# Patient Record
Sex: Female | Born: 1945
Health system: Southern US, Community
[De-identification: ages and names within clinical notes are randomized; demographics above are authoritative.]

## PROBLEM LIST (undated history)

## (undated) DIAGNOSIS — I10 Essential (primary) hypertension: Secondary | ICD-10-CM

## (undated) DIAGNOSIS — J302 Other seasonal allergic rhinitis: Secondary | ICD-10-CM

## (undated) DIAGNOSIS — K219 Gastro-esophageal reflux disease without esophagitis: Secondary | ICD-10-CM

## (undated) DIAGNOSIS — T7840XA Allergy, unspecified, initial encounter: Secondary | ICD-10-CM

## (undated) DIAGNOSIS — R001 Bradycardia, unspecified: Secondary | ICD-10-CM

## (undated) DIAGNOSIS — E785 Hyperlipidemia, unspecified: Secondary | ICD-10-CM

## (undated) DIAGNOSIS — M1990 Unspecified osteoarthritis, unspecified site: Secondary | ICD-10-CM

## (undated) HISTORY — PX: WISDOM TOOTH EXTRACTION: SHX21

## (undated) HISTORY — PX: POLYPECTOMY: SHX149

## (undated) HISTORY — DX: Hyperlipidemia, unspecified: E78.5

## (undated) HISTORY — DX: Gastro-esophageal reflux disease without esophagitis: K21.9

## (undated) HISTORY — DX: Other seasonal allergic rhinitis: J30.2

## (undated) HISTORY — DX: Allergy, unspecified, initial encounter: T78.40XA

## (undated) HISTORY — PX: TONSILLECTOMY: SUR1361

## (undated) HISTORY — DX: Bradycardia, unspecified: R00.1

## (undated) HISTORY — DX: Essential (primary) hypertension: I10

## (undated) HISTORY — DX: Unspecified osteoarthritis, unspecified site: M19.90

---

## 1983-07-29 HISTORY — PX: FOOT SURGERY: SHX648

## 1993-07-28 HISTORY — PX: ABDOMINAL HYSTERECTOMY: SHX81

## 1998-09-10 ENCOUNTER — Encounter: Payer: Self-pay | Admitting: General Surgery

## 1998-09-10 ENCOUNTER — Ambulatory Visit (HOSPITAL_BASED_OUTPATIENT_CLINIC_OR_DEPARTMENT_OTHER): Admission: RE | Admit: 1998-09-10 | Discharge: 1998-09-10 | Payer: Self-pay | Admitting: General Surgery

## 1999-09-25 ENCOUNTER — Encounter: Admission: RE | Admit: 1999-09-25 | Discharge: 1999-09-25 | Payer: Self-pay | Admitting: Family Medicine

## 1999-09-25 ENCOUNTER — Encounter: Payer: Self-pay | Admitting: Family Medicine

## 1999-10-02 ENCOUNTER — Encounter: Admission: RE | Admit: 1999-10-02 | Discharge: 1999-10-15 | Payer: Self-pay | Admitting: Family Medicine

## 2009-09-22 ENCOUNTER — Emergency Department (HOSPITAL_COMMUNITY): Admission: EM | Admit: 2009-09-22 | Discharge: 2009-09-22 | Payer: Self-pay | Admitting: Family Medicine

## 2009-09-22 ENCOUNTER — Emergency Department (HOSPITAL_COMMUNITY): Admission: EM | Admit: 2009-09-22 | Discharge: 2009-09-23 | Payer: Self-pay | Admitting: Emergency Medicine

## 2010-08-12 ENCOUNTER — Ambulatory Visit (HOSPITAL_COMMUNITY)
Admission: RE | Admit: 2010-08-12 | Discharge: 2010-08-12 | Payer: Self-pay | Source: Home / Self Care | Attending: Orthopedic Surgery | Admitting: Orthopedic Surgery

## 2011-07-29 HISTORY — PX: COLONOSCOPY: SHX174

## 2012-03-22 ENCOUNTER — Encounter: Payer: Self-pay | Admitting: Internal Medicine

## 2012-03-22 ENCOUNTER — Ambulatory Visit (AMBULATORY_SURGERY_CENTER): Payer: 59 | Admitting: *Deleted

## 2012-03-22 VITALS — Ht 64.5 in | Wt 141.7 lb

## 2012-03-22 DIAGNOSIS — Z1211 Encounter for screening for malignant neoplasm of colon: Secondary | ICD-10-CM

## 2012-03-22 MED ORDER — MOVIPREP 100 G PO SOLR: 1.0000 | Freq: Once | ORAL | Status: DC

## 2012-04-05 ENCOUNTER — Encounter: Payer: Self-pay | Admitting: Gastroenterology

## 2012-04-09 ENCOUNTER — Ambulatory Visit (AMBULATORY_SURGERY_CENTER): Payer: 59 | Admitting: Internal Medicine

## 2012-04-09 ENCOUNTER — Encounter: Payer: Self-pay | Admitting: Internal Medicine

## 2012-04-09 VITALS — BP 140/79 | HR 68 | Temp 97.3°F | Resp 25 | Ht 64.0 in | Wt 141.0 lb

## 2012-04-09 DIAGNOSIS — D126 Benign neoplasm of colon, unspecified: Secondary | ICD-10-CM

## 2012-04-09 DIAGNOSIS — Z1211 Encounter for screening for malignant neoplasm of colon: Secondary | ICD-10-CM

## 2012-04-09 MED ORDER — SODIUM CHLORIDE 0.9 % IV SOLN: 500.0000 mL | INTRAVENOUS | Status: DC

## 2012-04-09 NOTE — Patient Instructions (Addendum)

## 2012-04-09 NOTE — Op Note (Signed)
[facility] [address] [place], [address]   COLONOSCOPY PROCEDURE REPORT  PATIENT: Joss, Friedel  MR#: [id number] BIRTHDATE: June 10, 1946 , 66  yrs. old GENDER: Female ENDOSCOPIST: Hart Carwin, MD REFERRED BY:  Merri Brunette, M.D. PROCEDURE DATE:  04/09/2012 PROCEDURE:   Colonoscopy with snare polypectomy and Colonoscopy with cold biopsy polypectomy ASA CLASS:   Class II INDICATIONS:average risk screening. MEDICATIONS: MAC sedation, administered by CRNA and Propofol (Diprivan)250 mg IV  DESCRIPTION OF PROCEDURE:   After the risks and benefits and of the procedure were explained, informed consent was obtained.  A digital rectal exam revealed no abnormalities of the rectum.    The LB CF-[id number]  endoscope was introduced through the anus and advanced to the cecum, which was identified by both the appendix and ileocecal valve .  The quality of the prep was good, using MoviPrep .  The instrument was then slowly withdrawn as the colon was fully examined.     COLON FINDINGS: Two sessile polyps ranging between 3-54mm in size were found in the descending colon.  A polypectomy was performed with cold forceps and with a cold snare.  4mm and 6 mm polyps at 60 cm and 80 cm, removed with biopsies and cold snare The resection was complete and the polyp tissue was completely retrieved. Retroflexed views revealed no abnormalities.     The scope was then withdrawn from the patient and the procedure completed.  COMPLICATIONS: There were no complications. ENDOSCOPIC IMPRESSION: Two sessile polyps ranging between 3-8mm in size were found in the descending colonat 60 and 80 cm, polypectomy was performed with cold forceps and with a cold snare  RECOMMENDATIONS: 1.  await pathology results 2.  High fiber diet   REPEAT EXAM: In 5 year(s)  for Colonoscopy.  cc:  _______________________________ eSignedHart Carwin, MD 04/09/2012 10:21 AM     PATIENT  NAME:  Lucindia, Lemley MR#: [id number]

## 2012-04-09 NOTE — Progress Notes (Signed)
Patient did not experience any of the following events: a burn prior to discharge; a fall within the facility; wrong site/side/patient/procedure/implant event; or a hospital transfer or hospital admission upon discharge from the facility. (G8907) Patient did not have preoperative order for IV antibiotic SSI prophylaxis. (G8918)  

## 2012-04-12 ENCOUNTER — Telehealth: Payer: Self-pay

## 2012-04-12 NOTE — Telephone Encounter (Signed)
  Follow up Call-  Call back number 04/09/2012  Post procedure Call Back phone  # [phone]  Permission to leave phone message Yes     Patient questions:  Do you have a fever, pain , or abdominal swelling? no Pain Score  0 *  Have you tolerated food without any problems? yes  Have you been able to return to your normal activities? yes  Do you have any questions about your discharge instructions: Diet   no Medications  no Follow up visit  no  Do you have questions or concerns about your Care? no  Actions: * If pain score is 4 or above: No action needed, pain <4.

## 2012-04-13 ENCOUNTER — Encounter: Payer: Self-pay | Admitting: Internal Medicine

## 2012-10-22 ENCOUNTER — Ambulatory Visit (INDEPENDENT_AMBULATORY_CARE_PROVIDER_SITE_OTHER): Payer: Self-pay | Admitting: Family Medicine

## 2012-10-22 ENCOUNTER — Telehealth: Payer: Self-pay | Admitting: Radiology

## 2012-10-22 ENCOUNTER — Ambulatory Visit: Payer: Self-pay

## 2012-10-22 VITALS — BP 144/80 | HR 91 | Temp 98.7°F | Resp 18 | Ht 63.0 in | Wt 140.8 lb

## 2012-10-22 DIAGNOSIS — E7800 Pure hypercholesterolemia, unspecified: Secondary | ICD-10-CM | POA: Insufficient documentation

## 2012-10-22 DIAGNOSIS — S52121A Displaced fracture of head of right radius, initial encounter for closed fracture: Secondary | ICD-10-CM

## 2012-10-22 DIAGNOSIS — M25529 Pain in unspecified elbow: Secondary | ICD-10-CM

## 2012-10-22 DIAGNOSIS — I10 Essential (primary) hypertension: Secondary | ICD-10-CM | POA: Insufficient documentation

## 2012-10-22 DIAGNOSIS — M25521 Pain in right elbow: Secondary | ICD-10-CM

## 2012-10-22 DIAGNOSIS — S52123A Displaced fracture of head of unspecified radius, initial encounter for closed fracture: Secondary | ICD-10-CM

## 2012-10-22 MED ORDER — HYDROCODONE-ACETAMINOPHEN 5-325 MG PO TABS: 1.0000 | ORAL_TABLET | Freq: Four times a day (QID) | ORAL | Status: DC | PRN

## 2012-10-22 NOTE — Progress Notes (Signed)
   [address], [place] [address]   Phone [phone]  Subjective:    Patient ID: Denise Hodges, female    DOB: 05/21/1946, 67 y.o.   MRN: [id number]  HPI Pt presents to clinic with R elbow pain.  She fell in Feb and her R elbow hurt for a day or so but then the pain stopped and she was back to doing anything she wanted.  On sat she hit her elbow on her headboard but she had forgotten about this until questioned.  Then on Tuesday her elbow started to hurt really bad and the pain has worsened since then.  She has noticed swelling around her elbow and now has pain in her wrist and shoulder.  She has tried tylenol and ice and neither are helping with her pain.  She was up most of the night because the pain was so bad.     Review of Systems  Musculoskeletal: Positive for joint swelling.       Objective:   Physical Exam  Vitals reviewed. Constitutional: She is oriented to person, place, and time. She appears well-developed and well-nourished.  HENT:  Head: Normocephalic and atraumatic.  Right Ear: External ear normal.  Left Ear: External ear normal.  Pulmonary/Chest: Effort normal.  Musculoskeletal:       Right elbow: She exhibits swelling. Tenderness found. Radial head (significant tenderness with palpation) and lateral epicondyle (mild) tenderness noted. No olecranon process tenderness noted.  Any movement of arm including wrist and shoulder. cause pain pain in her elbow.  She has significant swelling above and below posterior elbow no swelling over olecranon process/bursa.  There is no erythema.  Skin does not hurt.  Pronation and supination of arm cause pain.  Good pulses and distal sensation.  Pt has decreased grip strength due to elbow pain.  Neurological: She is alert and oriented to person, place, and time.  Skin: Skin is warm and dry.  Psychiatric: She has a normal mood and affect. Her behavior is normal. Judgment and thought content normal.     [facility] reading (PRIMARY) by   Dr. Katrinka Blazing.  ? Impacted radial head fx. ? Irregularity of ulnar.       Assessment & Plan:  Right elbow pain/radial head fracture - Plan: DG Elbow Complete Right, Ambulatory referral to Orthopedic Surgery, HYDROcodone-acetaminophen (NORCO/VICODIN) 5-325 MG per tablet -  Pt places in long arm splint and sling and will see ortho next week.  After splint ins placed pt has improvement in pain.  Pt's diagnosis and treatment plan was discussed with Dr Katrinka Blazing.

## 2012-10-22 NOTE — Telephone Encounter (Signed)
Call report from radiology patient has radial head fracture. To you.

## 2012-10-22 NOTE — Progress Notes (Signed)
History and physical exams discussed in detail with Benny Lennert, PA-C.  Xray reviewed +proximal radial head fracture.  Agree with assessment and plan; placed in long arm splint and will refer to ortho for further management.

## 2012-12-08 ENCOUNTER — Other Ambulatory Visit: Payer: Self-pay | Admitting: Orthopedic Surgery

## 2012-12-14 ENCOUNTER — Encounter (HOSPITAL_COMMUNITY): Payer: Self-pay | Admitting: Pharmacy Technician

## 2012-12-22 ENCOUNTER — Encounter (HOSPITAL_COMMUNITY)
Admission: RE | Admit: 2012-12-22 | Discharge: 2012-12-22 | Disposition: A | Discharge status: Home / Self Care | Payer: Medicare HMO | Source: Ambulatory Visit | Attending: Orthopedic Surgery | Admitting: Orthopedic Surgery

## 2012-12-22 ENCOUNTER — Ambulatory Visit (HOSPITAL_COMMUNITY)
Admission: RE | Admit: 2012-12-22 | Discharge: 2012-12-22 | Disposition: A | Discharge status: Home / Self Care | Payer: Medicare HMO | Source: Ambulatory Visit | Attending: Orthopedic Surgery | Admitting: Orthopedic Surgery

## 2012-12-22 ENCOUNTER — Encounter (HOSPITAL_COMMUNITY): Payer: Self-pay

## 2012-12-22 DIAGNOSIS — Z01818 Encounter for other preprocedural examination: Secondary | ICD-10-CM | POA: Insufficient documentation

## 2012-12-22 DIAGNOSIS — Z01812 Encounter for preprocedural laboratory examination: Secondary | ICD-10-CM | POA: Insufficient documentation

## 2012-12-22 DIAGNOSIS — M19019 Primary osteoarthritis, unspecified shoulder: Secondary | ICD-10-CM | POA: Insufficient documentation

## 2012-12-22 LAB — URINALYSIS, ROUTINE W REFLEX MICROSCOPIC
Ketones, ur: NEGATIVE mg/dL
Leukocytes, UA: NEGATIVE
Nitrite: NEGATIVE
Specific Gravity, Urine: 1.017 (ref 1.005–1.030)
pH: 6.5 (ref 5.0–8.0)

## 2012-12-22 LAB — CBC WITH DIFFERENTIAL/PLATELET
Basophils Absolute: 0 10*3/uL (ref 0.0–0.1)
Basophils Relative: 0 % (ref 0–1)
Eosinophils Absolute: 0.3 10*3/uL (ref 0.0–0.7)
Eosinophils Relative: 4 % (ref 0–5)
HCT: 35.8 % — ABNORMAL LOW (ref 36.0–46.0)
MCHC: 32.7 g/dL (ref 30.0–36.0)
MCV: 84.2 fL (ref 78.0–100.0)
Monocytes Absolute: 0.5 10*3/uL (ref 0.1–1.0)
Platelets: 329 10*3/uL (ref 150–400)
RDW: 13.1 % (ref 11.5–15.5)
WBC: 7.6 10*3/uL (ref 4.0–10.5)

## 2012-12-22 LAB — COMPREHENSIVE METABOLIC PANEL
ALT: 16 U/L (ref 0–35)
AST: 20 U/L (ref 0–37)
Albumin: 3.7 g/dL (ref 3.5–5.2)
Calcium: 9.6 mg/dL (ref 8.4–10.5)
Creatinine, Ser: 0.67 mg/dL (ref 0.50–1.10)
GFR calc non Af Amer: 88 mL/min — ABNORMAL LOW (ref >90)
Sodium: 137 mEq/L (ref 135–145)
Total Protein: 7.3 g/dL (ref 6.0–8.3)

## 2012-12-22 LAB — PROTIME-INR: INR: 0.94 (ref 0.00–1.49)

## 2012-12-22 NOTE — Progress Notes (Signed)
EKG 12/22/12 on chart, LOV note Dr. Katrinka Blazing 12/22/12 on chart

## 2012-12-22 NOTE — Patient Instructions (Addendum)
20 Martine L Mungin  12/22/2012   Your procedure is scheduled on: 12/30/12  Report to [facility] at 0730 AM.  Call this number if you have problems the morning of surgery [phone]: [phone]   Remember:   Do not eat food or drink liquids After Midnight.     Take these medicines the morning of surgery with A SIP OF WATER: zyrtec, hydrocodone if needed   Do not wear jewelry, make-up or nail polish.  Do not wear lotions, powders, or perfumes. You may wear deodorant.  Do not shave 48 hours prior to surgery. Men may shave face and neck.  Do not bring valuables to the hospital.  Contacts, dentures or bridgework may not be worn into surgery.  Leave suitcase in the car. After surgery it may be brought to your room.  For patients admitted to the hospital, checkout time is 11:00 AM the day of discharge.    Please read over the following fact sheets that you were given: MRSA Information, blood fact sheet, incentive spirometry fact sheet Birdie Sons, RN  pre op nurse call if needed [phone]    FAILURE TO FOLLOW THESE INSTRUCTIONS MAY RESULT IN CANCELLATION OF YOUR SURGERY   Patient Signature: ___________________________________________

## 2012-12-30 ENCOUNTER — Encounter (HOSPITAL_COMMUNITY): Payer: Self-pay | Admitting: Anesthesiology

## 2012-12-30 ENCOUNTER — Encounter (HOSPITAL_COMMUNITY)
Admission: RE | Disposition: A | Discharge status: Home / Self Care | Payer: Self-pay | Source: Ambulatory Visit | Attending: Orthopedic Surgery

## 2012-12-30 ENCOUNTER — Inpatient Hospital Stay (HOSPITAL_COMMUNITY)
Admission: RE | Admit: 2012-12-30 | Discharge: 2013-01-01 | DRG: 484 | Disposition: A | Discharge status: Home / Self Care | Payer: Medicare HMO | Source: Ambulatory Visit | Attending: Orthopedic Surgery | Admitting: Orthopedic Surgery

## 2012-12-30 ENCOUNTER — Ambulatory Visit (HOSPITAL_COMMUNITY): Payer: Medicare HMO | Admitting: Anesthesiology

## 2012-12-30 ENCOUNTER — Inpatient Hospital Stay (HOSPITAL_COMMUNITY): Payer: Medicare HMO

## 2012-12-30 DIAGNOSIS — K219 Gastro-esophageal reflux disease without esophagitis: Secondary | ICD-10-CM | POA: Diagnosis present

## 2012-12-30 DIAGNOSIS — E785 Hyperlipidemia, unspecified: Secondary | ICD-10-CM | POA: Diagnosis present

## 2012-12-30 DIAGNOSIS — M19019 Primary osteoarthritis, unspecified shoulder: Principal | ICD-10-CM | POA: Diagnosis present

## 2012-12-30 DIAGNOSIS — F17200 Nicotine dependence, unspecified, uncomplicated: Secondary | ICD-10-CM | POA: Diagnosis present

## 2012-12-30 DIAGNOSIS — I10 Essential (primary) hypertension: Secondary | ICD-10-CM | POA: Diagnosis present

## 2012-12-30 HISTORY — PX: TOTAL SHOULDER ARTHROPLASTY: SHX126

## 2012-12-30 LAB — TYPE AND SCREEN: ABO/RH(D): B POS

## 2012-12-30 SURGERY — ARTHROPLASTY, SHOULDER, TOTAL
Anesthesia: General | Site: Shoulder | Laterality: Right | Wound class: Clean

## 2012-12-30 MED ORDER — FLEET ENEMA 7-19 GM/118ML RE ENEM: 1.0000 | ENEMA | Freq: Once | RECTAL | Status: AC | PRN

## 2012-12-30 MED ORDER — HYDROMORPHONE HCL PF 1 MG/ML IJ SOLN: 0.2500 mg | INTRAMUSCULAR | Status: DC | PRN

## 2012-12-30 MED ORDER — POLYETHYLENE GLYCOL 3350 17 G PO PACK: 17.0000 g | PACK | Freq: Every day | ORAL | Status: DC | PRN

## 2012-12-30 MED ORDER — CEFAZOLIN SODIUM-DEXTROSE 2-3 GM-% IV SOLR
2.0000 g | INTRAVENOUS | Status: AC
  Administered 2012-12-30: 2 g via INTRAVENOUS

## 2012-12-30 MED ORDER — OXYCODONE-ACETAMINOPHEN 5-325 MG PO TABS: 1.0000 | ORAL_TABLET | ORAL | Status: DC | PRN

## 2012-12-30 MED ORDER — PHENOL 1.4 % MT LIQD: 1.0000 | OROMUCOSAL | Status: DC | PRN

## 2012-12-30 MED ORDER — OXYCODONE HCL 5 MG PO TABS
5.0000 mg | ORAL_TABLET | ORAL | Status: DC | PRN
  Administered 2012-12-30: 5 mg via ORAL
  Administered 2012-12-30 – 2013-01-01 (×7): 10 mg via ORAL
  Filled 2012-12-30 (×7): qty 2
  Filled 2012-12-30: qty 1

## 2012-12-30 MED ORDER — ACETAMINOPHEN 650 MG RE SUPP: 650.0000 mg | Freq: Four times a day (QID) | RECTAL | Status: DC | PRN

## 2012-12-30 MED ORDER — DIPHENHYDRAMINE HCL 12.5 MG/5ML PO ELIX: 12.5000 mg | ORAL_SOLUTION | ORAL | Status: DC | PRN

## 2012-12-30 MED ORDER — CEFAZOLIN SODIUM 1-5 GM-% IV SOLN
1.0000 g | Freq: Four times a day (QID) | INTRAVENOUS | Status: AC
  Administered 2012-12-30 – 2012-12-31 (×3): 1 g via INTRAVENOUS
  Filled 2012-12-30 (×3): qty 50

## 2012-12-30 MED ORDER — ONDANSETRON HCL 4 MG/2ML IJ SOLN
INTRAMUSCULAR | Status: DC | PRN
  Administered 2012-12-30: 4 mg via INTRAVENOUS

## 2012-12-30 MED ORDER — SODIUM CHLORIDE 0.9 % IR SOLN
Status: DC | PRN
  Administered 2012-12-30: 1000 mL

## 2012-12-30 MED ORDER — ACETAMINOPHEN 10 MG/ML IV SOLN
INTRAVENOUS | Status: AC
  Filled 2012-12-30: qty 100

## 2012-12-30 MED ORDER — LACTATED RINGERS IV SOLN: INTRAVENOUS | Status: DC

## 2012-12-30 MED ORDER — DOCUSATE SODIUM 100 MG PO CAPS
100.0000 mg | ORAL_CAPSULE | Freq: Two times a day (BID) | ORAL | Status: DC
  Administered 2012-12-31 – 2013-01-01 (×3): 100 mg via ORAL

## 2012-12-30 MED ORDER — ONDANSETRON HCL 4 MG PO TABS: 4.0000 mg | ORAL_TABLET | Freq: Four times a day (QID) | ORAL | Status: DC | PRN

## 2012-12-30 MED ORDER — LORATADINE 10 MG PO TABS
10.0000 mg | ORAL_TABLET | Freq: Every day | ORAL | Status: DC
  Filled 2012-12-30 (×3): qty 1

## 2012-12-30 MED ORDER — BISACODYL 5 MG PO TBEC: 5.0000 mg | DELAYED_RELEASE_TABLET | Freq: Every day | ORAL | Status: DC | PRN

## 2012-12-30 MED ORDER — ROCURONIUM BROMIDE 100 MG/10ML IV SOLN
INTRAVENOUS | Status: DC | PRN
  Administered 2012-12-30: 50 mg via INTRAVENOUS
  Administered 2012-12-30: 10 mg via INTRAVENOUS

## 2012-12-30 MED ORDER — GLYCOPYRROLATE 0.2 MG/ML IJ SOLN
INTRAMUSCULAR | Status: DC | PRN
  Administered 2012-12-30: 0.4 mg via INTRAVENOUS

## 2012-12-30 MED ORDER — PANTOPRAZOLE SODIUM 20 MG PO TBEC
20.0000 mg | DELAYED_RELEASE_TABLET | Freq: Every day | ORAL | Status: DC
  Administered 2012-12-30 – 2013-01-01 (×3): 20 mg via ORAL
  Filled 2012-12-30 (×3): qty 1

## 2012-12-30 MED ORDER — ALUMINUM HYDROXIDE GEL 320 MG/5ML PO SUSP: 15.0000 mL | ORAL | Status: DC | PRN

## 2012-12-30 MED ORDER — LIDOCAINE HCL (CARDIAC) 20 MG/ML IV SOLN
INTRAVENOUS | Status: DC | PRN
  Administered 2012-12-30: 50 mg via INTRAVENOUS

## 2012-12-30 MED ORDER — NEOSTIGMINE METHYLSULFATE 1 MG/ML IJ SOLN
INTRAMUSCULAR | Status: DC | PRN
  Administered 2012-12-30: 3 mg via INTRAVENOUS

## 2012-12-30 MED ORDER — MORPHINE SULFATE 2 MG/ML IJ SOLN
1.0000 mg | INTRAMUSCULAR | Status: DC | PRN
  Administered 2012-12-30 – 2012-12-31 (×2): 1 mg via INTRAVENOUS
  Filled 2012-12-30 (×2): qty 1

## 2012-12-30 MED ORDER — PHENYLEPHRINE HCL 10 MG/ML IJ SOLN
INTRAMUSCULAR | Status: DC | PRN
  Administered 2012-12-30 (×3): 40 ug via INTRAVENOUS

## 2012-12-30 MED ORDER — ACETAMINOPHEN 10 MG/ML IV SOLN
INTRAVENOUS | Status: DC | PRN
  Administered 2012-12-30: 1000 mg via INTRAVENOUS

## 2012-12-30 MED ORDER — ROPIVACAINE HCL 5 MG/ML IJ SOLN
INTRAMUSCULAR | Status: DC | PRN
  Administered 2012-12-30: 30 mL

## 2012-12-30 MED ORDER — ONDANSETRON HCL 4 MG/2ML IJ SOLN: 4.0000 mg | Freq: Four times a day (QID) | INTRAMUSCULAR | Status: DC | PRN

## 2012-12-30 MED ORDER — POVIDONE-IODINE 7.5 % EX SOLN
Freq: Once | CUTANEOUS | Status: AC
  Administered 2012-12-30: 09:00:00 via TOPICAL

## 2012-12-30 MED ORDER — 0.9 % SODIUM CHLORIDE (POUR BTL) OPTIME
TOPICAL | Status: DC | PRN
  Administered 2012-12-30: 1000 mL

## 2012-12-30 MED ORDER — SODIUM CHLORIDE 0.9 % IV SOLN
INTRAVENOUS | Status: DC
  Administered 2012-12-30: 17:00:00 via INTRAVENOUS

## 2012-12-30 MED ORDER — IRBESARTAN 150 MG PO TABS
150.0000 mg | ORAL_TABLET | Freq: Every day | ORAL | Status: DC
  Administered 2012-12-30 – 2013-01-01 (×3): 150 mg via ORAL
  Filled 2012-12-30 (×3): qty 1

## 2012-12-30 MED ORDER — ROPIVACAINE HCL 5 MG/ML IJ SOLN
INTRAMUSCULAR | Status: AC
  Filled 2012-12-30: qty 30

## 2012-12-30 MED ORDER — PHENYLEPHRINE HCL 10 MG/ML IJ SOLN
10.0000 mg | INTRAVENOUS | Status: DC | PRN
  Administered 2012-12-30: 10 ug/min via INTRAVENOUS

## 2012-12-30 MED ORDER — CEFAZOLIN SODIUM-DEXTROSE 2-3 GM-% IV SOLR
INTRAVENOUS | Status: AC
  Filled 2012-12-30: qty 50

## 2012-12-30 MED ORDER — METOCLOPRAMIDE HCL 10 MG PO TABS: 5.0000 mg | ORAL_TABLET | Freq: Three times a day (TID) | ORAL | Status: DC | PRN

## 2012-12-30 MED ORDER — BUPIVACAINE-EPINEPHRINE PF 0.25-1:200000 % IJ SOLN
INTRAMUSCULAR | Status: AC
  Filled 2012-12-30: qty 30

## 2012-12-30 MED ORDER — FENTANYL CITRATE 0.05 MG/ML IJ SOLN
INTRAMUSCULAR | Status: AC
  Filled 2012-12-30: qty 2

## 2012-12-30 MED ORDER — FENTANYL CITRATE 0.05 MG/ML IJ SOLN
50.0000 ug | Freq: Once | INTRAMUSCULAR | Status: AC
  Administered 2012-12-30: 100 ug via INTRAVENOUS

## 2012-12-30 MED ORDER — FENOFIBRATE 160 MG PO TABS
160.0000 mg | ORAL_TABLET | Freq: Every day | ORAL | Status: DC
  Administered 2012-12-30 – 2013-01-01 (×3): 160 mg via ORAL
  Filled 2012-12-30 (×3): qty 1

## 2012-12-30 MED ORDER — ACETAMINOPHEN 325 MG PO TABS: 650.0000 mg | ORAL_TABLET | Freq: Four times a day (QID) | ORAL | Status: DC | PRN

## 2012-12-30 MED ORDER — ASPIRIN EC 325 MG PO TBEC
325.0000 mg | DELAYED_RELEASE_TABLET | Freq: Two times a day (BID) | ORAL | Status: DC
  Administered 2012-12-30 – 2013-01-01 (×4): 325 mg via ORAL
  Filled 2012-12-30 (×6): qty 1

## 2012-12-30 MED ORDER — LACTATED RINGERS IV SOLN
INTRAVENOUS | Status: DC | PRN
  Administered 2012-12-30 (×2): via INTRAVENOUS

## 2012-12-30 MED ORDER — PROPOFOL 10 MG/ML IV BOLUS
INTRAVENOUS | Status: DC | PRN
  Administered 2012-12-30: 150 mg via INTRAVENOUS

## 2012-12-30 MED ORDER — MENTHOL 3 MG MT LOZG
1.0000 | LOZENGE | OROMUCOSAL | Status: DC | PRN
  Filled 2012-12-30: qty 9

## 2012-12-30 MED ORDER — MIDAZOLAM HCL 2 MG/2ML IJ SOLN
INTRAMUSCULAR | Status: AC
  Filled 2012-12-30: qty 4

## 2012-12-30 MED ORDER — MIDAZOLAM HCL 2 MG/2ML IJ SOLN
1.0000 mg | INTRAMUSCULAR | Status: DC | PRN
  Administered 2012-12-30: 2 mg via INTRAVENOUS

## 2012-12-30 MED ORDER — HYDROCODONE-ACETAMINOPHEN 5-325 MG PO TABS: 1.0000 | ORAL_TABLET | ORAL | Status: DC | PRN

## 2012-12-30 MED ORDER — METOCLOPRAMIDE HCL 5 MG/ML IJ SOLN: 5.0000 mg | Freq: Three times a day (TID) | INTRAMUSCULAR | Status: DC | PRN

## 2012-12-30 MED ORDER — CYCLOSPORINE 0.05 % OP EMUL
1.0000 [drp] | Freq: Two times a day (BID) | OPHTHALMIC | Status: DC
  Administered 2012-12-30 – 2013-01-01 (×4): 1 [drp] via OPHTHALMIC
  Filled 2012-12-30 (×5): qty 1

## 2012-12-30 SURGICAL SUPPLY — 75 items
ASSEMBLY NECK TAPER FIXED 135 (Orthopedic Implant) ×2 IMPLANT
BAG ZIPLOCK 12X15 (MISCELLANEOUS) ×2 IMPLANT
BIT DRILL 2.4X128 (BIT) IMPLANT
BLADE SAW SAG 73X25 THK (BLADE) ×1
BLADE SAW SGTL 18X1.27X75 (BLADE) IMPLANT
BLADE SAW SGTL 73X25 THK (BLADE) ×1 IMPLANT
BLADE SURG 15 STRL LF DISP TIS (BLADE) ×1 IMPLANT
BLADE SURG 15 STRL SS (BLADE) ×1
CEMENT BONE DEPUY (Cement) ×2 IMPLANT
CHLORAPREP W/TINT 26ML (MISCELLANEOUS) ×2 IMPLANT
CLEANER TIP ELECTROSURG 2X2 (MISCELLANEOUS) ×2 IMPLANT
CLOTH BEACON ORANGE TIMEOUT ST (SAFETY) ×2 IMPLANT
CLSR STERI-STRIP ANTIMIC 1/2X4 (GAUZE/BANDAGES/DRESSINGS) ×2 IMPLANT
COVER SURGICAL LIGHT HANDLE (MISCELLANEOUS) IMPLANT
DRAPE INCISE IOBAN 66X45 STRL (DRAPES) ×2 IMPLANT
DRAPE ORTHO SPLIT 77X108 STRL (DRAPES) ×1
DRAPE POUCH INSTRU U-SHP 10X18 (DRAPES) ×2 IMPLANT
DRAPE SURG 17X11 SM STRL (DRAPES) ×2 IMPLANT
DRAPE SURG ORHT 6 SPLT 77X108 (DRAPES) ×1 IMPLANT
DRAPE TABLE BACK 44X90 PK DISP (DRAPES) ×2 IMPLANT
DRAPE U-SHAPE 47X51 STRL (DRAPES) ×4 IMPLANT
DRSG ADAPTIC 3X8 NADH LF (GAUZE/BANDAGES/DRESSINGS) ×2 IMPLANT
DRSG MEPILEX BORDER 4X4 (GAUZE/BANDAGES/DRESSINGS) ×2 IMPLANT
DRSG MEPILEX BORDER 4X8 (GAUZE/BANDAGES/DRESSINGS) ×2 IMPLANT
ELECT BLADE TIP CTD 4 INCH (ELECTRODE) ×2 IMPLANT
ELECT REM PT RETURN 9FT ADLT (ELECTROSURGICAL) ×2
ELECTRODE REM PT RTRN 9FT ADLT (ELECTROSURGICAL) ×1 IMPLANT
EVACUATOR 1/8 PVC DRAIN (DRAIN) ×2 IMPLANT
FACESHIELD LNG OPTICON STERILE (SAFETY) ×2 IMPLANT
GLENOID ANCHOR PEG CROSSLK 44 (Orthopedic Implant) ×2 IMPLANT
GLOVE BIOGEL PI IND STRL 7.0 (GLOVE) ×1 IMPLANT
GLOVE BIOGEL PI IND STRL 7.5 (GLOVE) ×1 IMPLANT
GLOVE BIOGEL PI IND STRL 8 (GLOVE) ×1 IMPLANT
GLOVE BIOGEL PI INDICATOR 7.0 (GLOVE) ×1
GLOVE BIOGEL PI INDICATOR 7.5 (GLOVE) ×1
GLOVE BIOGEL PI INDICATOR 8 (GLOVE) ×1
GLOVE ORTHO TXT STRL SZ7.5 (GLOVE) ×2 IMPLANT
GLOVE SURG ORTHO 7.0 STRL STRW (GLOVE) ×2 IMPLANT
GLOVE SURG SS PI 7.5 STRL IVOR (GLOVE) ×4 IMPLANT
GOWN BRE IMP PREV XXLGXLNG (GOWN DISPOSABLE) ×4 IMPLANT
GOWN STRL NON-REIN LRG LVL3 (GOWN DISPOSABLE) ×4 IMPLANT
GOWN STRL REIN XL XLG (GOWN DISPOSABLE) ×2 IMPLANT
HANDPIECE INTERPULSE COAX TIP (DISPOSABLE) ×1
HEAD HUM ECC 44X15MM SHOULDER (Head) ×2 IMPLANT
HEMOSTAT SURGICEL 4X8 (HEMOSTASIS) ×2 IMPLANT
HOOD PEEL AWAY FACE SHEILD DIS (HOOD) ×6 IMPLANT
KIT BASIN OR (CUSTOM PROCEDURE TRAY) ×2 IMPLANT
MANIFOLD NEPTUNE II (INSTRUMENTS) ×2 IMPLANT
NEEDLE MA TROC 1/2 (NEEDLE) ×2 IMPLANT
NOZZLE PRISM 8.5MM (MISCELLANEOUS) IMPLANT
NS IRRIG 1000ML POUR BTL (IV SOLUTION) ×2 IMPLANT
PACK SHOULDER CUSTOM OPM052 (CUSTOM PROCEDURE TRAY) ×2 IMPLANT
PIN METAGLENE 2.5 (PIN) ×2 IMPLANT
POSITIONER SURGICAL ARM (MISCELLANEOUS) ×2 IMPLANT
RETRIEVER SUT HEWSON (MISCELLANEOUS) ×2 IMPLANT
SET HNDPC FAN SPRY TIP SCT (DISPOSABLE) ×1 IMPLANT
SLING ARM IMMOBILIZER LRG (SOFTGOODS) ×2 IMPLANT
SLING ARM IMMOBILIZER MED (SOFTGOODS) ×2 IMPLANT
SMARTMIX MINI TOWER (MISCELLANEOUS) ×2
SPONGE LAP 18X18 X RAY DECT (DISPOSABLE) IMPLANT
STAPLER VISISTAT 35W (STAPLE) IMPLANT
STEM AP PC10 (Stem) ×2 IMPLANT
STRIP CLOSURE SKIN 1/2X4 (GAUZE/BANDAGES/DRESSINGS) ×2 IMPLANT
SUCTION FRAZIER 12FR DISP (SUCTIONS) ×2 IMPLANT
SUPPORT WRAP ARM LG (MISCELLANEOUS) ×2 IMPLANT
SUT ETHIBOND NAB CT1 #1 30IN (SUTURE) ×6 IMPLANT
SUT FIBERWIRE #2 38 T-5 BLUE (SUTURE) ×6
SUT MNCRL AB 4-0 PS2 18 (SUTURE) ×2 IMPLANT
SUT VIC AB 0 CT1 36 (SUTURE) ×4 IMPLANT
SUT VIC AB 2-0 CT1 27 (SUTURE) ×2
SUT VIC AB 2-0 CT1 TAPERPNT 27 (SUTURE) ×2 IMPLANT
SUTURE FIBERWR #2 38 T-5 BLUE (SUTURE) ×3 IMPLANT
TOWEL OR 17X26 10 PK STRL BLUE (TOWEL DISPOSABLE) ×4 IMPLANT
TOWER SMARTMIX MINI (MISCELLANEOUS) ×1 IMPLANT
WATER STERILE IRR 1500ML POUR (IV SOLUTION) ×2 IMPLANT

## 2012-12-30 NOTE — H&P (Signed)
Denise Hodges is an 67 y.o. female.   Chief Complaint: R shoulder pain HPI: R shoulder endstage arthritis, bone on bone on x ray. Pain interferes with sleep and quality of life.  Failed conservative treatment of NSAID, activity modification.  Declined injections.  Past Medical History  Diagnosis Date  . Seasonal allergies   . GERD (gastroesophageal reflux disease)   . Hyperlipidemia   . Hypertension   . Arthritis     Past Surgical History  Procedure Laterality Date  . Abdominal hysterectomy  1995  . Foot surgery  1985    bilateral for flat feet with pain  . Colonoscopy  2013    brodie  . Wisdom tooth extraction    . Tonsillectomy  as child    Family History  Problem Relation Age of Onset  . Colon cancer Maternal Aunt 70  . Colon cancer Cousin 50    maternal  . Rectal cancer Neg Hx   . Stomach cancer Neg Hx   . Dementia Mother    Social History:  reports that she has been smoking Cigarettes.  She has been smoking about 0.25 packs per day. She has never used smokeless tobacco. She reports that she does not drink alcohol or use illicit drugs.  Allergies:  Allergies  Allergen Reactions  . Macrobid (Nitrofurantoin Macrocrystal) Hives and Shortness Of Breath    Medications Prior to Admission  Medication Sig Dispense Refill  . aspirin 81 MG tablet Take 81 mg by mouth daily.      . Calcium Carbonate-Vitamin D (CALCIUM 600 + D PO) Take 1 tablet by mouth daily.      . cetirizine (ZYRTEC) 10 MG tablet Take 10 mg by mouth daily.      . cholecalciferol (VITAMIN D-400) 400 UNITS TABS Take 400 Units by mouth daily.      . cycloSPORINE (RESTASIS) 0.05 % ophthalmic emulsion Place 1 drop into both eyes 2 (two) times daily.      . Fenofibrate 150 MG CAPS Take 1 capsule by mouth daily.      Marland Kitchen HYDROcodone-acetaminophen (NORCO/VICODIN) 5-325 MG per tablet Take 1 tablet by mouth every 6 (six) hours as needed for pain.  30 tablet  0  . lansoprazole (PREVACID) 15 MG capsule Take 15 mg by  mouth as needed.      . naproxen sodium (ANAPROX) 220 MG tablet Take 220 mg by mouth 2 (two) times daily as needed (for pain).      . valsartan (DIOVAN) 160 MG tablet Take 160 mg by mouth at bedtime.       . vitamin C (ASCORBIC ACID) 500 MG tablet Take 500 mg by mouth daily.        No results found for this or any previous visit (from the past 48 hour(s)). No results found.  Review of Systems  All other systems reviewed and are negative.    Blood pressure 150/71, pulse 77, temperature 98 F (36.7 C), temperature source Oral, resp. rate 18, SpO2 100.00%. Physical Exam  Constitutional: She is oriented to person, place, and time. She appears well-developed and well-nourished.  HENT:  Head: Atraumatic.  Eyes: EOM are normal.  Cardiovascular: Intact distal pulses.   Respiratory: Effort normal.  Musculoskeletal:       Right shoulder: She exhibits decreased range of motion, tenderness and pain.  Neurological: She is alert and oriented to person, place, and time.  Skin: Skin is warm and dry.  Psychiatric: She has a normal mood and affect.  Assessment/Plan R shoulder endstage glenohumeral osteoarthritis. Plan R total shoulder replacement Risks / benefits of surgery discussed Consent on chart  NPO for OR Preop antibiotics   Selda Jalbert WILLIAM 12/30/2012, 10:38 AM

## 2012-12-30 NOTE — Anesthesia Procedure Notes (Signed)
Anesthesia Regional Block:  Interscalene brachial plexus block  Pre-Anesthetic Checklist: ,, timeout performed, Correct Patient, Correct Site, Correct Laterality, Correct Procedure, Correct Position, site marked, Risks and benefits discussed,  Surgical consent,  Pre-op evaluation,  At surgeon's request and post-op pain management  Laterality: Right  Prep: chloraprep       Needles:  Injection technique: Single-shot  Needle Type: Echogenic Stimulator Needle     Needle Length:cm 9 cm Needle Gauge: 20 and 20 G    Additional Needles:  Procedures: ultrasound guided (picture in chart) Interscalene brachial plexus block  Nerve Stimulator or Paresthesia:  Response: 0.5 mA,   Additional Responses:   Narrative:  Start time: 12/30/2012 10:30 AM End time: 12/30/2012 10:40 AM Injection made incrementally with aspirations every 5 mL.  Performed by: Personally  Anesthesiologist: Gaetano Hawthorne MD  Additional Notes: Patient tolerated the procedure well without complications  Interscalene brachial plexus block

## 2012-12-30 NOTE — Progress Notes (Signed)
Utilization review completed.  

## 2012-12-30 NOTE — Op Note (Signed)
Procedure(s): RIGHT TOTAL SHOULDER ARTHROPLASTY Procedure Note  Denise Hodges female 67 y.o. 12/30/2012  Procedure(s) and Anesthesia Type:    * RIGHT TOTAL SHOULDER ARTHROPLASTY - General  Surgeon(s) and Role:    * Mable Paris, MD - Primary   Indications:  67 y.o. female  With endstage right shoulder arthritis. Pain and dysfunction interfered with quality of life and nonoperative treatment with activity modification, NSAIDS failed.     Surgeon: Mable Paris   Assistants: Damita Lack PA-C [facility] was present and scrubbed throughout the procedure and was essential in positioning, retraction, exposure, and closure)  Anesthesia: General endotracheal anesthesia with preoperative interscalene block    Procedure Detail  RIGHT TOTAL SHOULDER ARTHROPLASTY  Findings: DePuy size 10 press-fit stem proximally porous coated with a 44 x 15 eccentric head and a 44 anchor peg glenoid. Reconstruction was anatomic. Bone quality was good. The lesser tuberosity was taken down with an osteotomy and repaired at the end.  Estimated Blood Loss:  200 mL         Drains: 1 medium hemovac  Blood Given: none          Specimens: none        Complications:  * No complications entered in OR log *         Disposition: PACU - hemodynamically stable.         Condition: stable    Procedure:   The patient was identified in the preoperative holding area where I personally marked the operative extremity after verifying with the patient and consent. She  was taken to the operating room where She was transferred to the   operative table.  The patient received an interscalene block in   the holding area by the attending anesthesiologist.  General anesthesia was induced   in the operating room without complication.  The patient did receive IV  Ancef prior to the commencement of the procedure.  The patient was   placed in the beach-chair position with the back raised about  30   degrees.  The nonoperative extremity and head and neck were carefully   positioned and padded protecting against neurovascular compromise.  The   left upper extremity was then prepped and draped in the standard sterile   fashion.    The appropriate operative time-out was performed with   Anesthesia, the perioperative staff, as well as myself and we all agreed   that the right side was the correct operative site.  An approximately   10 cm incision was made from the tip of the coracoid to the center point of the   humerus at the level of the axilla.  Dissection was carried down sharply   through subcutaneous tissues and cephalic vein was identified and taken   laterally with the deltoid.  The pectoralis major was taken medially.  The   upper 1 cm of the pectoralis major was released from its attachment on   the humerus.  The clavipectoral fascia was incised just lateral to the   conjoined tendon.  This incision was carried up to but not into the   coracoacromial ligament.  Digital palpation was used to prove   integrity of the axillary nerve which was protected throughout the   procedure.  Musculocutaneous nerve was not palpated in the operative   field.  Conjoined tendon was then retracted gently medially and the   deltoid laterally.  Anterior circumflex humeral vessels were clamped and   coagulated.  The soft  tissues overlying the biceps was incised and this   incision was carried across the transverse humeral ligament to the base   of the coracoid.  The biceps was tenodesed to the soft tissue just above   pectoralis major and the remaining portion of the biceps superiorly was   excised.  An osteotomy was performed at the lesser tuberosity.  Capsule was then   released all the way down to the 6 o'clock position of the humeral head.   The humeral head was then delivered with simultaneous adduction,   extension and external rotation.  All humeral osteophytes were removed   and the  anatomic neck of the humerus was marked and cut free hand at   approximately 30 degrees retroversion within about 3 mm of the cuff   reflection posteriorly.  The head size was estimated to be a 44 medium   offset.  At that point, the humeral head was retracted posteriorly with   a Fukuda retractor.   The remaining biceps anchor and the entire anterior-inferior   labrum was excised.  The posterior labrum was also excised but the   posterior capsule was not released.   the anterior capsule was released from 12:00 to 6:00 position.  The guidepin was placed bicortically with +0 elevated guide.  The reamer was used to ream to concentric bone with punctate bleeding.  This gave an excellent concentric surface.  The center hole was then drilled for an anchor peg glenoid followed by the three peripheral holes and none of the holes   exited the glenoid wall.  I then pulse irrigated these holes and dried   them with Surgicel.  The three peripheral holes were then   pressurized cemented and the anchor peg glenoid was placed and impacted   with an excellent fit.  The glenoid was a 44 component.  The proximal humerus was then again exposed taking care not to displace the glenoid.    The humerus was then sequentially reamed going from 6 to 10 by 2 mm incriments. The 10 mm reamer was found to have appropriate cortical contact.  A   box osteotome was then used and a 10-mm broach.  The broach handle was   removed and the trial head was placed.   Calcar reamer was used.The eccentric 44 x 15 head fit best.  With the trial implantation of the component, there was   approximately 50% posterior translation with immediate snap back to the   anatomic position.  With forward elevation, there was no tendency   towards posterior subluxation.   The trial was removed and the final implant was prepared on a back table.  The trial was removed and the final implant was prepared on a back table.   Small holes were drilled on both  sides of the lesser tuberosity osteotomy, through which 3 #2 FiberWires were passed. The implant was then placed through the loop of all 3 #2 FiberWires and impacted with an excellent press-fit. This achieved excellent anatomic reconstruction of the proximal humerus.  The joint was then copiously irrigated with pulse lavage.  The subscapularis and   lesser tuberosity osteotomy were then repaired using the 3 #2 fiber wires previously passed.   One #1 Ethibond was placed at the rotator interval just above   the lesser tuberosity. After repair of the lesser tuberosity, a medium   Hemovac was placed out anterolaterally and again copious irrigation was   used.   Skin was closed with 2-0  Vicryl sutures in the deep dermal layer and 4-0 Monocryl in a subcuticular  running fashion.  Sterile dressings were then applied including Steri- Strips, 4x4s, ABDs and tape.  The patient was placed in a sling and allowed to awaken from general anesthesia and taken to the recovery room in stable  condition.      POSTOPERATIVE PLAN:  Early passive range of motion will be allowed with the goal of 40 degrees external rotation and a 140 degrees forward elevation.  No internal rotation at this time.  No active motion of the arm until the lesser tuberosity heals.  The patient will likely be kept in the hospital for 1-2 days and then discharged home.

## 2012-12-30 NOTE — Preoperative (Signed)
Beta Blockers   Reason not to administer Beta Blockers:Not Applicable 

## 2012-12-30 NOTE — Transfer of Care (Signed)
Immediate Anesthesia Transfer of Care Note  Patient: Denise Hodges  Procedure(s) Performed: Procedure(s) with comments: RIGHT TOTAL SHOULDER ARTHROPLASTY (Right) - interscaline block  Patient Location: PACU  Anesthesia Type:General  Level of Consciousness: awake, alert  and oriented  Airway & Oxygen Therapy: Patient Spontanous Breathing and Patient connected to face mask oxygen  Post-op Assessment: Report given to PACU RN, Post -op Vital signs reviewed and stable and Patient moving all extremities X 4  Post vital signs: Reviewed and stable  Complications: No apparent anesthesia complications

## 2012-12-30 NOTE — Anesthesia Postprocedure Evaluation (Signed)
  Anesthesia Post-op Note  Patient: Denise Hodges  Procedure(s) Performed: Procedure(s) (LRB): RIGHT TOTAL SHOULDER ARTHROPLASTY (Right)  Patient Location: PACU  Anesthesia Type: GA combined with regional for post-op pain  Level of Consciousness: awake and alert   Airway and Oxygen Therapy: Patient Spontanous Breathing  Post-op Pain: mild  Post-op Assessment: Post-op Vital signs reviewed, Patient's Cardiovascular Status Stable, Respiratory Function Stable, Patent Airway and No signs of Nausea or vomiting  Last Vitals:  Filed Vitals:   12/30/12 1441  BP: 119/56  Pulse: 63  Temp: 36.4 C  Resp: 15    Post-op Vital Signs: stable   Complications: No apparent anesthesia complications

## 2012-12-30 NOTE — Anesthesia Preprocedure Evaluation (Addendum)
Anesthesia Evaluation  Patient identified by MRN, date of birth, ID band Patient awake    Reviewed: Allergy & Precautions, H&P , NPO status , Patient's Chart, lab work & pertinent test results  Airway Mallampati: II TM Distance: >3 FB Neck ROM: full    Dental no notable dental hx. (+) Edentulous Upper, Dental Advisory Given and Missing All front lower are missing:   Pulmonary neg pulmonary ROS,  breath sounds clear to auscultation  Pulmonary exam normal       Cardiovascular Exercise Tolerance: Good hypertension, Pt. on medications Rhythm:regular Rate:Normal     Neuro/Psych negative neurological ROS  negative psych ROS   GI/Hepatic negative GI ROS, Neg liver ROS, GERD-  Medicated and Controlled,  Endo/Other  negative endocrine ROS  Renal/GU negative Renal ROS  negative genitourinary   Musculoskeletal   Abdominal   Peds  Hematology negative hematology ROS (+)   Anesthesia Other Findings   Reproductive/Obstetrics negative OB ROS                          Anesthesia Physical Anesthesia Plan  ASA: II  Anesthesia Plan: General   Post-op Pain Management:    Induction: Intravenous  Airway Management Planned: Oral ETT  Additional Equipment:   Intra-op Plan:   Post-operative Plan: Extubation in OR  Informed Consent: I have reviewed the patients History and Physical, chart, labs and discussed the procedure including the risks, benefits and alternatives for the proposed anesthesia with the patient or authorized representative who has indicated his/her understanding and acceptance.   Dental Advisory Given  Plan Discussed with: CRNA and Surgeon  Anesthesia Plan Comments:         Anesthesia Quick Evaluation

## 2012-12-31 ENCOUNTER — Encounter (HOSPITAL_COMMUNITY): Payer: Self-pay | Admitting: Orthopedic Surgery

## 2012-12-31 DIAGNOSIS — M19019 Primary osteoarthritis, unspecified shoulder: Secondary | ICD-10-CM

## 2012-12-31 LAB — CBC
Platelets: 284 10*3/uL (ref 150–400)
RBC: 3.36 MIL/uL — ABNORMAL LOW (ref 3.87–5.11)
WBC: 8.8 10*3/uL (ref 4.0–10.5)

## 2012-12-31 LAB — COMPREHENSIVE METABOLIC PANEL
ALT: 16 U/L (ref 0–35)
AST: 19 U/L (ref 0–37)
Albumin: 3.1 g/dL — ABNORMAL LOW (ref 3.5–5.2)
CO2: 25 mEq/L (ref 19–32)
Chloride: 104 mEq/L (ref 96–112)
GFR calc non Af Amer: 85 mL/min — ABNORMAL LOW (ref >90)
Potassium: 4.1 mEq/L (ref 3.5–5.1)
Sodium: 137 mEq/L (ref 135–145)
Total Bilirubin: 0.2 mg/dL — ABNORMAL LOW (ref 0.3–1.2)

## 2012-12-31 MED ORDER — OXYCODONE-ACETAMINOPHEN 5-325 MG PO TABS: 1.0000 | ORAL_TABLET | ORAL | Status: DC | PRN

## 2012-12-31 NOTE — Evaluation (Signed)
Occupational Therapy Evaluation Patient Details Name: Denise Hodges MRN: [id number] DOB: 1946/06/17 Today's Date: 12/31/2012 Time: 5784-6962 OT Time Calculation (min): 29 min  OT Assessment / Plan / Recommendation Clinical Impression  This 67 year old female is s/p R TSA.  She will benefit from one more visit in acute to review shoulder education and exercises and complete family education.      OT Assessment  Patient needs continued OT Services    Follow Up Recommendations  No OT follow up    Barriers to Discharge      Equipment Recommendations   (to be further assessed)    Recommendations for Other Services    Frequency  Min 2X/week    Precautions / Restrictions Precautions Precautions: Shoulder Type of Shoulder Precautions: PROM shoulder up to 140 and ER to 40.  Elbow - finger ROM, sling, adls Restrictions Weight Bearing Restrictions: No   Pertinent Vitals/Pain 7/10 RUE, repositioned with ice    ADL  Upper Body Bathing: Moderate assistance Where Assessed - Upper Body Bathing: Supine, head of bed up Transfers/Ambulation Related to ADLs: seen at bed level.  Will see pt again prior to discharge ADL Comments: Pt premedicated but with painful RUE.  Family will help with adls.  Pt mod to max A with adls due to pain.    OT Diagnosis: Generalized weakness  OT Problem List: Impaired UE functional use;Decreased strength;Decreased range of motion;Decreased activity tolerance OT Treatment Interventions: Self-care/ADL training;Patient/family education;Therapeutic exercise   OT Goals Acute Rehab OT Goals OT Goal Formulation: With patient Time For Goal Achievement: 01/02/13 Potential to Achieve Goals: Good Miscellaneous OT Goals Miscellaneous OT Goal #1: Pt will be independent with PROM, FF up to 140 and ER to 40, supine OT Goal: Miscellaneous Goal #1 - Progress: Goal set today Miscellaneous OT Goal #2: Family will be independent with assisting with UB ADLs/donning sling  following shoulder precautions OT Goal: Miscellaneous Goal #2 - Progress: Goal set today Miscellaneous OT Goal #3: Pt will be supervision with transfer to toilet, ambulating OT Goal: Miscellaneous Goal #3 - Progress: Goal set today  Visit Information  Last OT Received On: 12/31/12 Assistance Needed: +1    Subjective Data  Subjective: Let's get it over with Patient Stated Goal: get arm better   Prior Functioning     Home Living Lives With:  (family) Available Help at Discharge: Family Prior Function Level of Independence: Independent;Independent with assistive device(s) (sometimes uses cane) Communication Communication: No difficulties Dominant Hand: Right         Vision/Perception     Cognition  Cognition Arousal/Alertness: Awake/alert Behavior During Therapy: WFL for tasks assessed/performed Overall Cognitive Status: Within Functional Limits for tasks assessed    Extremity/Trunk Assessment Right Upper Extremity Assessment RUE ROM/Strength/Tone: Deficits;Due to precautions Left Upper Extremity Assessment LUE ROM/Strength/Tone: WFL for tasks assessed     Mobility       Exercise Other Exercises Other Exercises: Performed PROM to RUE--FF to 80 degrees and external rotation to neutral. Worked through 5 repetitions of each with rest breaks.  Used long sponge as dowel.  Pt needs cues to use LUE to lower RUE.  Will reinforce on next visit.  Also performed elbow to finger AROM.  All done in supine   Balance     End of Session OT - End of Session Activity Tolerance: Patient limited by pain Patient left: in bed;with call bell/phone within reach  GO     [facility] 12/31/2012, 10:31 AM Marica Otter, OTR/L [phone] 12/31/2012

## 2012-12-31 NOTE — Progress Notes (Signed)
Occupational Therapy Treatment Patient Details Name: Denise Hodges MRN: [id number] DOB: 1945-10-04 Today's Date: 12/31/2012 Time: 4098-1191 OT Time Calculation (min): 38 min  OT Assessment / Plan / Recommendation Comments on Treatment Session Worked on PROM exercises this pm.  Pt moved less than am and had "popping" during FF.  See exercise section below.  Pt needs reinforcement with exercises.      Follow Up Recommendations  No OT follow up    Barriers to Discharge       Equipment Recommendations  None recommended by OT    Recommendations for Other Services    Frequency Min 2X/week   Plan      Precautions / Restrictions Precautions Precautions: Shoulder Type of Shoulder Precautions: PROM shoulder up to 140 and ER to 40.  Elbow - finger ROM, sling, adls Restrictions Weight Bearing Restrictions: No   Pertinent Vitals/Pain 5/10 beginning; 7/10 with exercise; 5/10 when resting in bed with ice applied    ADL  Upper Body Dressing: Performed;Maximal assistance Where Assessed - Upper Body Dressing: Unsupported sitting Toilet Transfer: Performed;Minimal assistance Toilet Transfer Method: Sit to stand Toilet Transfer Equipment: Comfort height toilet Toileting - Clothing Manipulation and Hygiene: Performed;Supervision/safety Where Assessed - Glass blower/designer Manipulation and Hygiene: Sit to stand from 3-in-1 or toilet Transfers/Ambulation Related to ADLs: min A for balance ambulating to bathroom; min A for bed mobility    OT Diagnosis:    OT Problem List:   OT Treatment Interventions:     OT Goals Acute Rehab OT Goals Time For Goal Achievement: 01/02/13 Miscellaneous OT Goals Miscellaneous OT Goal #1: Pt will be independent with PROM, FF up to 140 and ER to 40, supine OT Goal: Miscellaneous Goal #1 - Progress: Progressing toward goals Miscellaneous OT Goal #3: Pt will be supervision with transfer to toilet, ambulating OT Goal: Miscellaneous Goal #3 - Progress: Progressing  toward goals  Visit Information  Last OT Received On: 12/31/12 Assistance Needed: +1    Subjective Data      Prior Functioning       Cognition  Cognition Arousal/Alertness: Awake/alert Behavior During Therapy: WFL for tasks assessed/performed Overall Cognitive Status: Within Functional Limits for tasks assessed    Mobility  Bed Mobility Bed Mobility: Supine to Sit;Sit to Supine Supine to Sit: 3: Mod assist Sit to Supine: 4: Min assist Details for Bed Mobility Assistance: assist for trunk Transfers Transfers: Sit to Stand Sit to Stand: 5: Supervision;4: Min assist (min A bed; supervision comfort height commode)    Exercises  Other Exercises Other Exercises: Performed PROM to RUE.  FF to 60 and below.  Pt's pain increased from 5/10 to 7/10.  Kept in lower range but pt felt pop two more times, including last time arm was returned to side on pilow (bottom of range)..  Pt performed ER (subneutral this pm).  She was -20 from neutral and used long sponge as dowel.    Educated she could use a long kitchen spoon if she doesn't have a dowel at home Other Exercises: Pt needs reinforcement with PROM exercises.  Therapist supported weight under elbow, but pt performed all movement.  Cued to only use LUE to move R.   Balance     End of Session OT - End of Session Activity Tolerance: Patient limited by pain Patient left: in bed;with call bell/phone within reach Nurse Communication:  (recommended pt stay 1 more night; also tell MD about "popping" of shoulder)  GO     Lesly Joslyn Marica Otter, OTR/L  [phone] 12/31/2012 12/31/2012, 4:35 PM

## 2012-12-31 NOTE — Progress Notes (Signed)
PATIENT ID: Denise Hodges   1 Day Post-Op Procedure(s) (LRB): RIGHT TOTAL SHOULDER ARTHROPLASTY (Right)  Subjective: Doing well today. In some pain in right shoulder once block wore off. Eating breakfast this am. No other complaints or concerns.  Objective:  Filed Vitals:   12/31/12 0429  BP: 121/68  Pulse: 83  Temp: 98 F (36.7 C)  Resp: 16     Awake, alert, orientatedx3 R UE dressing c/d/i Drain removed today Wiggles fingers, distally nvi Adequate activation of deltoid  Labs:   Recent Labs  12/31/12 0425  HGB 9.2*   Recent Labs  12/31/12 0425  WBC 8.8  RBC 3.36*  HCT 28.2*  PLT 284   Recent Labs  12/31/12 0425  NA 137  K 4.1  CL 104  CO2 25  BUN 11  CREATININE 0.74  GLUCOSE 136*  CALCIUM 9.0    Assessment and Plan: 1 day s/p total shoulder arthroplasty Will work with OT today on PROM to 140FF, 40 ER using other hand as helper, went over these with patient this am Continue current pain mgmt Can likely go home this afternoon after working with therapy and pain well controlled Fu Dr. Ave Filter in 2 weeks  VTE proph: ASA325mg  BID, SCDs

## 2013-01-01 NOTE — Progress Notes (Signed)
Occupational Therapy Treatment Patient Details Name: Denise Hodges MRN: [id number] DOB: 08/25/1945 Today's Date: 01/01/2013 Time: 4098-1191 OT Time Calculation (min): 38 min  OT Assessment / Plan / Recommendation Comments on Treatment Session Pt tolerated session well. Spoke with PA prior to session and she states it is ok to proceed with exercises today even with the "pop" sound reported by OT yesterday. Husand present and educated both pt and him on the ADL care handout and exercise handout. He helped with ADL care and exercises and felt comfortable with all.     Follow Up Recommendations  No OT follow up;Supervision - Intermittent    Barriers to Discharge       Equipment Recommendations  None recommended by OT    Recommendations for Other Services    Frequency Min 2X/week   Plan Discharge plan remains appropriate    Precautions / Restrictions Precautions Precautions: Shoulder Type of Shoulder Precautions: PROM shoulder up to 140 and ER to 40.  Elbow - finger ROM, sling, adls Required Braces or Orthoses: Other Brace/Splint Other Brace/Splint: R shoulder immobilizer        ADL  Toilet Transfer: Min Psychologist, sport and exercise: Comfort height toilet    OT Diagnosis:    OT Problem List:   OT Treatment Interventions:     OT Goals Acute Rehab OT Goals OT Goal Formulation: With patient Time For Goal Achievement: 01/02/13 Potential to Achieve Goals: Good Miscellaneous OT Goals Miscellaneous OT Goal #1: Pt will be independent with PROM, FF up to 140 and ER to 40, supine OT Goal: Miscellaneous Goal #1 - Progress: Other (comment) (met for 40 degrees ER) Miscellaneous OT Goal #2: Family will be independent with assisting with UB ADLs/donning sling following shoulder precautions OT Goal: Miscellaneous Goal #2 - Progress: Met Miscellaneous OT Goal #3: Pt will be supervision with transfer to toilet, ambulating OT Goal: Miscellaneous Goal #3 - Progress: Progressing toward  goals  Visit Information  Last OT Received On: 01/01/13 Assistance Needed: +1    Subjective Data  Subjective: it feels better today Patient Stated Goal: home when ready today                        Exercises  Other Exercises Other Exercises: Pt performed elbow, wrist and hand AROM X 10 with supervision for proper technique. Husband assisted with providing elbow support for FF with pt using L UE to raise R UE. Pt achieved about 70 degrees of FF today and with better pain tolerance. She performed 6 reps today. Husband then supported R elbow for pt to perform dowel exercises for ER for 10 reps.. She tolerated 10 reps of ER to 40 degrees. Used LHS for dowel and all performed in supine.   Donning/doffing shirt without moving shoulder: Caregiver independent with task Method for sponge bathing under operated UE: Patient able to independently direct caregiver;Caregiver independent with task Donning/doffing sling/immobilizer: Caregiver independent with task Correct positioning of sling/immobilizer: Caregiver independent with task ROM for elbow, wrist and digits of operated UE: Supervision/safety Sling wearing schedule (on at all times/off for ADL's): Caregiver independent with task;Patient able to independently direct caregiver Proper positioning of operated UE when showering: Caregiver independent with task;Patient able to independently direct caregiver Positioning of UE while sleeping: Caregiver independent with task;Patient able to independently direct caregiver   Balance     End of Session OT - End of Session Activity Tolerance: Patient tolerated treatment well Patient left: with call bell/phone within reach (  EOB with husband for d/c)  GO     Lennox Laity [phone] 01/01/2013, 12:49 PM

## 2013-01-01 NOTE — Progress Notes (Signed)
2 days S/P R Total Shoulder per Dr Ave Filter Pt doing well, eager to finish PT today and progress home today, px well controlled, w/o complaint  BP 158/78  Pulse 97  Temp(Src) 99.8 F (37.7 C) (Oral)  Resp 16  Ht 5\' 4"  (1.626 m)  Wt 63.957 kg (141 lb)  BMI 24.19 kg/m2  SpO2 92% Awake, alert, orientatedx3  R UE dressing c/d/i  Wiggles fingers, distally nvi  Adequate activation of deltoid   Assessment and Plan:  1 day s/p total shoulder arthroplasty  Will work with OT today on PROM to 140FF, 40 ER using other hand as helper, went over these with patient this am  Continue current pain mgmt  D/C home today after working with therapy Fu Dr. Ave Filter in 2 weeks

## 2013-01-01 NOTE — Progress Notes (Signed)
Patient received discharge instructions with husband at bedside. Both verbalized understanding without further questions. Prescription given and follow up appt discussed. Patient belongings packed. Patient to be taken downstairs via wheelchair to be discharged home.

## 2013-01-01 NOTE — Care Management Note (Signed)
    Page 1 of 1   01/01/2013     12:27:59 PM   CARE MANAGEMENT NOTE 01/01/2013  Patient:  Denise Hodges, Denise Hodges   Account Number:  [id number]  Date Initiated:  12/31/2012  Documentation initiated by:  Colleen Can  Subjective/Objective Assessment:   dx rt shoulder replacemnt     Action/Plan:   Cm spoke with patient. Plans are for patient to return to her home in [place] where spouse and other family members will be caregivers. there are no HH or DME needs.   Anticipated DC Date:  01/01/2013   Anticipated DC Plan:  HOME/SELF CARE      DC Planning Services  CM consult      Choice offered to / List presented to:             Status of service:  Completed, signed off Medicare Important Message given?   (If response is "NO", the following Medicare IM given date fields will be blank) Date Medicare IM given:   Date Additional Medicare IM given:    Discharge Disposition:  HOME/SELF CARE  Per UR Regulation:    If discussed at Long Length of Stay Meetings, dates discussed:    Comments:  01/01/13 Oluwademilade Kellett RN,BSN NCM WEEKEND [phone] NO NEEDS OR ORDERS.

## 2013-01-03 NOTE — Discharge Summary (Signed)
Patient ID: Denise Hodges MRN: [id number] DOB/AGE: Nov 17, 1945 67 y.o.  Admit date: 12/30/2012 Discharge date: 01/03/2013  Admission Diagnoses:  Principal Problem:   Arthritis of shoulder   Discharge Diagnoses:  Same  Past Medical History  Diagnosis Date  . Seasonal allergies   . GERD (gastroesophageal reflux disease)   . Hyperlipidemia   . Hypertension   . Arthritis     Surgeries: Procedure(s): RIGHT TOTAL SHOULDER ARTHROPLASTY on 12/30/2012   Consultants:    Discharged Condition: Improved  Hospital Course: Denise Hodges is an 67 y.o. female who was admitted 12/30/2012 for operative treatment ofArthritis of shoulder. Patient has severe unremitting pain that affects sleep, daily activities, and work/hobbies. After pre-op clearance the patient was taken to the operating room on 12/30/2012 and underwent  Procedure(s): RIGHT TOTAL SHOULDER ARTHROPLASTY.    Patient was given perioperative antibiotics:  Anti-infectives   Start     Dose/Rate Route Frequency Ordered Stop   12/30/12 1700  ceFAZolin (ANCEF) IVPB 1 g/50 mL premix     1 g 100 mL/hr over 30 Minutes Intravenous Every 6 hours 12/30/12 1451 12/31/12 0507   12/30/12 0840  ceFAZolin (ANCEF) IVPB 2 g/50 mL premix     2 g 100 mL/hr over 30 Minutes Intravenous On call to O.R. 12/30/12 0840 12/30/12 1107       Patient was given sequential compression devices, early ambulation, and ASA 325mg  BID to prevent DVT.  Patient benefited maximally from hospital stay and there were no complications.  Patient stayed in hospital for 2 days post operatively to be cleared by OT.  Recent vital signs: No data found.    Recent laboratory studies: No results found for this basename: WBC, HGB, HCT, PLT, NA, K, CL, CO2, BUN, CREATININE, GLUCOSE, PT, INR, CALCIUM, 2,  in the last 72 hours   Discharge Medications:     Medication List    STOP taking these medications       HYDROcodone-acetaminophen 5-325 MG per tablet  Commonly known as:   NORCO/VICODIN     naproxen sodium 220 MG tablet  Commonly known as:  ANAPROX      TAKE these medications       aspirin 81 MG tablet  Take 81 mg by mouth daily.     CALCIUM 600 + D PO  Take 1 tablet by mouth daily.     cetirizine 10 MG tablet  Commonly known as:  ZYRTEC  Take 10 mg by mouth daily.     cycloSPORINE 0.05 % ophthalmic emulsion  Commonly known as:  RESTASIS  Place 1 drop into both eyes 2 (two) times daily.     Fenofibrate 150 MG Caps  Take 1 capsule by mouth daily.     lansoprazole 15 MG capsule  Commonly known as:  PREVACID  Take 15 mg by mouth as needed.     oxyCODONE-acetaminophen 5-325 MG per tablet  Commonly known as:  ROXICET  Take 1-2 tablets by mouth every 4 (four) hours as needed for pain.     valsartan 160 MG tablet  Commonly known as:  DIOVAN  Take 160 mg by mouth at bedtime.     vitamin C 500 MG tablet  Commonly known as:  ASCORBIC ACID  Take 500 mg by mouth daily.     VITAMIN D-400 400 UNITS Tabs  Generic drug:  cholecalciferol  Take 400 Units by mouth daily.        Diagnostic Studies: Dg Chest 2 View  12/22/2012   *  RADIOLOGY REPORT*  Clinical Data: Preop.  CHEST - 2 VIEW  Comparison: 09/22/2009.  Findings: Trachea is midline.  Heart size normal.  Lungs are clear. No pleural fluid.  IMPRESSION: No acute findings.   Original Report Authenticated By: Leanna Battles, M.D.   Dg Shoulder Right Port  12/30/2012   *RADIOLOGY REPORT*  Clinical Data: Status post total right shoulder arthroplasty.  PORTABLE RIGHT SHOULDER - 2+ VIEW  Comparison: None  Findings: Well seated components of a total shoulder arthroplasty. No complicating features are demonstrated.  IMPRESSION: Well seated humeral head prosthesis without complicating features.   Original Report Authenticated By: Rudie Meyer, M.D.    Disposition: 01-Home or Self Care      Discharge Orders   Future Orders Complete By Expires     Call MD / Call 911  As directed     Comments:       If you experience chest pain or shortness of breath, CALL 911 and be transported to the hospital emergency room.  If you develope a fever above 101 F, pus (white drainage) or increased drainage or redness at the wound, or calf pain, call your surgeon's office.    Constipation Prevention  As directed     Comments:      Drink plenty of fluids.  Prune juice may be helpful.  You may use a stool softener, such as Colace (over the counter) 100 mg twice a day.  Use MiraLax (over the counter) for constipation as needed.    Diet - low sodium heart healthy  As directed     Driving restrictions  As directed     Comments:      No driving until cleared by physician.    Increase activity slowly as tolerated  As directed     Lifting restrictions  As directed     Comments:      No lifting until cleared by physician.       Follow-up Information   Follow up with Mable Paris, MD. Schedule an appointment as soon as possible for a visit in 2 weeks.   Contact information:   [address] [place] [address] [phone]        Signed: Jiles Harold 01/03/2013, 3:02 PM

## 2014-03-27 ENCOUNTER — Encounter: Payer: Self-pay | Admitting: Interventional Cardiology

## 2014-09-25 DIAGNOSIS — L918 Other hypertrophic disorders of the skin: Secondary | ICD-10-CM | POA: Diagnosis not present

## 2014-09-25 DIAGNOSIS — E782 Mixed hyperlipidemia: Secondary | ICD-10-CM | POA: Diagnosis not present

## 2014-09-25 DIAGNOSIS — I10 Essential (primary) hypertension: Secondary | ICD-10-CM | POA: Diagnosis not present

## 2014-09-25 DIAGNOSIS — F17200 Nicotine dependence, unspecified, uncomplicated: Secondary | ICD-10-CM | POA: Diagnosis not present

## 2014-09-25 DIAGNOSIS — G4700 Insomnia, unspecified: Secondary | ICD-10-CM | POA: Diagnosis not present

## 2014-11-30 DIAGNOSIS — H11122 Conjunctival concretions, left eye: Secondary | ICD-10-CM | POA: Diagnosis not present

## 2014-11-30 DIAGNOSIS — H2513 Age-related nuclear cataract, bilateral: Secondary | ICD-10-CM | POA: Diagnosis not present

## 2014-11-30 DIAGNOSIS — H04123 Dry eye syndrome of bilateral lacrimal glands: Secondary | ICD-10-CM | POA: Diagnosis not present

## 2014-11-30 DIAGNOSIS — H16041 Marginal corneal ulcer, right eye: Secondary | ICD-10-CM | POA: Diagnosis not present

## 2014-11-30 DIAGNOSIS — H16142 Punctate keratitis, left eye: Secondary | ICD-10-CM | POA: Diagnosis not present

## 2014-11-30 DIAGNOSIS — H10413 Chronic giant papillary conjunctivitis, bilateral: Secondary | ICD-10-CM | POA: Diagnosis not present

## 2014-12-01 DIAGNOSIS — H10413 Chronic giant papillary conjunctivitis, bilateral: Secondary | ICD-10-CM | POA: Diagnosis not present

## 2014-12-01 DIAGNOSIS — H16042 Marginal corneal ulcer, left eye: Secondary | ICD-10-CM | POA: Diagnosis not present

## 2014-12-01 DIAGNOSIS — H04123 Dry eye syndrome of bilateral lacrimal glands: Secondary | ICD-10-CM | POA: Diagnosis not present

## 2014-12-01 DIAGNOSIS — H2513 Age-related nuclear cataract, bilateral: Secondary | ICD-10-CM | POA: Diagnosis not present

## 2014-12-04 DIAGNOSIS — H16042 Marginal corneal ulcer, left eye: Secondary | ICD-10-CM | POA: Diagnosis not present

## 2014-12-08 DIAGNOSIS — H16042 Marginal corneal ulcer, left eye: Secondary | ICD-10-CM | POA: Diagnosis not present

## 2014-12-29 DIAGNOSIS — H16042 Marginal corneal ulcer, left eye: Secondary | ICD-10-CM | POA: Diagnosis not present

## 2015-02-01 DIAGNOSIS — H17822 Peripheral opacity of cornea, left eye: Secondary | ICD-10-CM | POA: Diagnosis not present

## 2015-02-01 DIAGNOSIS — H04123 Dry eye syndrome of bilateral lacrimal glands: Secondary | ICD-10-CM | POA: Diagnosis not present

## 2015-02-01 DIAGNOSIS — H2513 Age-related nuclear cataract, bilateral: Secondary | ICD-10-CM | POA: Diagnosis not present

## 2015-03-01 DIAGNOSIS — Z1231 Encounter for screening mammogram for malignant neoplasm of breast: Secondary | ICD-10-CM | POA: Diagnosis not present

## 2015-05-14 DIAGNOSIS — M79609 Pain in unspecified limb: Secondary | ICD-10-CM | POA: Diagnosis not present

## 2015-05-14 DIAGNOSIS — L03032 Cellulitis of left toe: Secondary | ICD-10-CM | POA: Diagnosis not present

## 2015-05-23 DIAGNOSIS — M2011 Hallux valgus (acquired), right foot: Secondary | ICD-10-CM | POA: Diagnosis not present

## 2015-05-23 DIAGNOSIS — M792 Neuralgia and neuritis, unspecified: Secondary | ICD-10-CM | POA: Diagnosis not present

## 2015-05-23 DIAGNOSIS — M2012 Hallux valgus (acquired), left foot: Secondary | ICD-10-CM | POA: Diagnosis not present

## 2015-06-26 DIAGNOSIS — Z23 Encounter for immunization: Secondary | ICD-10-CM | POA: Diagnosis not present

## 2015-06-26 DIAGNOSIS — I10 Essential (primary) hypertension: Secondary | ICD-10-CM | POA: Diagnosis not present

## 2015-06-26 DIAGNOSIS — Z1389 Encounter for screening for other disorder: Secondary | ICD-10-CM | POA: Diagnosis not present

## 2015-06-26 DIAGNOSIS — G4700 Insomnia, unspecified: Secondary | ICD-10-CM | POA: Diagnosis not present

## 2015-06-26 DIAGNOSIS — F17200 Nicotine dependence, unspecified, uncomplicated: Secondary | ICD-10-CM | POA: Diagnosis not present

## 2015-06-26 DIAGNOSIS — Z0000 Encounter for general adult medical examination without abnormal findings: Secondary | ICD-10-CM | POA: Diagnosis not present

## 2015-06-26 DIAGNOSIS — E782 Mixed hyperlipidemia: Secondary | ICD-10-CM | POA: Diagnosis not present

## 2015-07-27 ENCOUNTER — Emergency Department (HOSPITAL_COMMUNITY)
Admission: EM | Admit: 2015-07-27 | Discharge: 2015-07-27 | Disposition: A | Discharge status: Home / Self Care | Payer: BC Managed Care – PPO | Source: Home / Self Care

## 2015-07-27 ENCOUNTER — Encounter (HOSPITAL_COMMUNITY): Payer: Self-pay | Admitting: Emergency Medicine

## 2015-07-27 DIAGNOSIS — M62830 Muscle spasm of back: Secondary | ICD-10-CM

## 2015-07-27 MED ORDER — CYCLOBENZAPRINE HCL 10 MG PO TABS: 10.0000 mg | ORAL_TABLET | Freq: Two times a day (BID) | ORAL | Status: DC | PRN

## 2015-07-27 NOTE — Discharge Instructions (Signed)
Heat Therapy  Heat therapy can help ease sore, stiff, injured, and tight muscles and joints. Heat relaxes your muscles, which may help ease your pain.   RISKS AND COMPLICATIONS  If you have any of the following conditions, do not use heat therapy unless your health care provider has approved:   Poor circulation.   Healing wounds or scarred skin in the area being treated.   Diabetes, heart disease, or high blood pressure.   Not being able to feel (numbness) the area being treated.   Unusual swelling of the area being treated.   Active infections.   Blood clots.   Cancer.   Inability to communicate pain. This may include young children and people who have problems with their brain function (dementia).   Pregnancy.  Heat therapy should only be used on old, pre-existing, or long-lasting (chronic) injuries. Do not use heat therapy on new injuries unless directed by your health care provider.  HOW TO USE HEAT THERAPY  There are several different kinds of heat therapy, including:   Moist heat pack.   Warm water bath.   Hot water bottle.   Electric heating pad.   Heated gel pack.   Heated wrap.   Electric heating pad.  Use the heat therapy method suggested by your health care provider. Follow your health care provider's instructions on when and how to use heat therapy.  GENERAL HEAT THERAPY RECOMMENDATIONS   Do not sleep while using heat therapy. Only use heat therapy while you are awake.   Your skin may turn pink while using heat therapy. Do not use heat therapy if your skin turns red.   Do not use heat therapy if you have new pain.   High heat or long exposure to heat can cause burns. Be careful when using heat therapy to avoid burning your skin.   Do not use heat therapy on areas of your skin that are already irritated, such as with a rash or sunburn.  SEEK MEDICAL CARE IF:   You have blisters, redness, swelling, or numbness.   You have new pain.   Your pain is worse.  MAKE SURE  YOU:   Understand these instructions.   Will watch your condition.   Will get help right away if you are not doing well or get worse.     This information is not intended to replace advice given to you by your health care provider. Make sure you discuss any questions you have with your health care provider.     Document Released: 10/06/2011 Document Revised: 08/04/2014 Document Reviewed: 09/06/2013  Elsevier Interactive Patient Education 2016 [employer]

## 2015-07-27 NOTE — ED Notes (Signed)
The patient presented to the [facility] with a complaint of left lower back pain that radiates into her leg that started 4 days ago. The patient denied any injury.

## 2015-08-28 DIAGNOSIS — Z23 Encounter for immunization: Secondary | ICD-10-CM | POA: Diagnosis not present

## 2015-08-28 DIAGNOSIS — D649 Anemia, unspecified: Secondary | ICD-10-CM | POA: Diagnosis not present

## 2016-01-10 DIAGNOSIS — Z96611 Presence of right artificial shoulder joint: Secondary | ICD-10-CM | POA: Diagnosis not present

## 2016-01-10 DIAGNOSIS — E782 Mixed hyperlipidemia: Secondary | ICD-10-CM | POA: Diagnosis not present

## 2016-01-10 DIAGNOSIS — F17200 Nicotine dependence, unspecified, uncomplicated: Secondary | ICD-10-CM | POA: Diagnosis not present

## 2016-01-10 DIAGNOSIS — G4700 Insomnia, unspecified: Secondary | ICD-10-CM | POA: Diagnosis not present

## 2016-01-10 DIAGNOSIS — I10 Essential (primary) hypertension: Secondary | ICD-10-CM | POA: Diagnosis not present

## 2016-01-16 DIAGNOSIS — M25511 Pain in right shoulder: Secondary | ICD-10-CM | POA: Diagnosis not present

## 2016-01-16 DIAGNOSIS — M79641 Pain in right hand: Secondary | ICD-10-CM | POA: Diagnosis not present

## 2016-01-25 ENCOUNTER — Encounter (HOSPITAL_COMMUNITY): Payer: Self-pay | Admitting: Emergency Medicine

## 2016-01-25 ENCOUNTER — Ambulatory Visit (HOSPITAL_COMMUNITY)
Admission: EM | Admit: 2016-01-25 | Discharge: 2016-01-25 | Disposition: A | Discharge status: Home / Self Care | Payer: Commercial Managed Care - HMO | Attending: Emergency Medicine | Admitting: Emergency Medicine

## 2016-01-25 ENCOUNTER — Ambulatory Visit (HOSPITAL_COMMUNITY): Payer: Commercial Managed Care - HMO

## 2016-01-25 DIAGNOSIS — R0602 Shortness of breath: Secondary | ICD-10-CM | POA: Insufficient documentation

## 2016-01-25 DIAGNOSIS — R05 Cough: Secondary | ICD-10-CM | POA: Diagnosis not present

## 2016-01-25 DIAGNOSIS — J069 Acute upper respiratory infection, unspecified: Secondary | ICD-10-CM

## 2016-01-25 DIAGNOSIS — B9789 Other viral agents as the cause of diseases classified elsewhere: Secondary | ICD-10-CM

## 2016-01-25 MED ORDER — PREDNISONE 50 MG PO TABS: ORAL_TABLET | ORAL | Status: DC

## 2016-01-25 MED ORDER — HYDROCODONE-HOMATROPINE 5-1.5 MG/5ML PO SYRP: 5.0000 mL | ORAL_SOLUTION | Freq: Four times a day (QID) | ORAL | Status: DC | PRN

## 2016-01-25 NOTE — Discharge Instructions (Signed)
You have an upper respiratory infection. There is no sign of bronchitis or pneumonia at this time. Take prednisone daily for 5 days. This will help break up the congestion. Continue the Zyrtec daily. You can take a quarter tablet or 2.5 m's of the children's Zyrtec. Follow-up as needed.

## 2016-01-25 NOTE — ED Notes (Signed)
C/o cold sx onset x2 weeks Sx today include: chest congestion, prod cough, SOB Denies fevers, chills A&O x4... No acute distress.

## 2016-01-25 NOTE — ED Provider Notes (Signed)
CSN: [id number]     Arrival date & time 01/25/16  1256 History   First MD Initiated Contact with Patient 01/25/16 1309     Chief Complaint  Patient presents with  . URI   (Consider location/radiation/quality/duration/timing/severity/associated sxs/prior Treatment) HPI  He is a 70 year old woman here for evaluation of chest congestion. She states her symptoms started 2 weeks ago with had nose congestion and postnasal drip. One week ago, this moved into her chest. She reports feeling a lot of congestion, but is only getting up small amounts of clear phlegm. She does report feeling tired and fatigued over the last week as well as intermittently short of breath. Shortness of breath is more dependent on whether conditions rather than exertion. She does report some discomfort in her throat and upper chest with coughing only. No known fevers. She is eating and drinking well. She has been taking Zyrtec as well as Mucinex and Robitussin without improvement.  Past Medical History  Diagnosis Date  . Seasonal allergies   . GERD (gastroesophageal reflux disease)   . Hyperlipidemia   . Hypertension   . Arthritis    Past Surgical History  Procedure Laterality Date  . Abdominal hysterectomy  1995  . Foot surgery  1985    bilateral for flat feet with pain  . Colonoscopy  2013    brodie  . Wisdom tooth extraction    . Tonsillectomy  as child  . Total shoulder arthroplasty Right 12/30/2012    Procedure: RIGHT TOTAL SHOULDER ARTHROPLASTY;  Surgeon: Nita Sells, MD;  Location: [facility];  Service: Orthopedics;  Laterality: Right;  interscaline block   Family History  Problem Relation Age of Onset  . Colon cancer Maternal Aunt 22  . Colon cancer Cousin 91    maternal  . Rectal cancer Neg Hx   . Stomach cancer Neg Hx   . Dementia Mother    Social History  Substance Use Topics  . Smoking status: Current Every Day Smoker -- 0.25 packs/day    Types: Cigarettes  . Smokeless tobacco: Never  Used  . Alcohol Use: No   OB History    No data available     Review of Systems As in history of present illness Allergies  Macrobid  Home Medications   Prior to Admission medications   Medication Sig Start Date End Date Taking? Authorizing Provider  aspirin 81 MG tablet Take 81 mg by mouth daily.   Yes Historical Provider, MD  Calcium Carbonate-Vitamin D (CALCIUM 600 + D PO) Take 1 tablet by mouth daily.   Yes Historical Provider, MD  cetirizine (ZYRTEC) 10 MG tablet Take 10 mg by mouth daily.   Yes Historical Provider, MD  cholecalciferol (VITAMIN D-400) 400 UNITS TABS Take 400 Units by mouth daily.   Yes Historical Provider, MD  Fenofibrate 150 MG CAPS Take 1 capsule by mouth daily.   Yes Historical Provider, MD  lansoprazole (PREVACID) 15 MG capsule Take 15 mg by mouth as needed.   Yes Historical Provider, MD  valsartan (DIOVAN) 160 MG tablet Take 160 mg by mouth at bedtime.    Yes Historical Provider, MD  vitamin C (ASCORBIC ACID) 500 MG tablet Take 500 mg by mouth daily.   Yes Historical Provider, MD  cyclobenzaprine (FLEXERIL) 10 MG tablet Take 1 tablet (10 mg total) by mouth 2 (two) times daily as needed for muscle spasms. 07/27/15   Konrad Felix, PA  cycloSPORINE (RESTASIS) 0.05 % ophthalmic emulsion Place 1 drop into both  eyes 2 (two) times daily.    Historical Provider, MD  HYDROcodone-homatropine (HYCODAN) 5-1.5 MG/5ML syrup Take 5 mLs by mouth every 6 (six) hours as needed for cough. 01/25/16   Melony Overly, MD  oxyCODONE-acetaminophen (ROXICET) 5-325 MG per tablet Take 1-2 tablets by mouth every 4 (four) hours as needed for pain. 12/31/12   Grier Mitts, PA-C  predniSONE (DELTASONE) 50 MG tablet Take 1 pill daily for 5 days. 01/25/16   Melony Overly, MD   Meds Ordered and Administered this Visit  Medications - No data to display  BP 129/71 mmHg  Pulse 79  Temp(Src) 98.2 F (36.8 C) (Oral)  Resp 18  SpO2 98% No data found.   Physical Exam  Constitutional:  She is oriented to person, place, and time. She appears well-developed and well-nourished. No distress.  Neck: Neck supple.  Cardiovascular: Normal rate, regular rhythm and normal heart sounds.   No murmur heard. Pulmonary/Chest: Effort normal. No respiratory distress. She has no wheezes. She has no rales.  She had some inspiratory crackles in the left lung base that cleared after coughing.  Neurological: She is alert and oriented to person, place, and time.    ED Course  Procedures (including critical care time)  Labs Review Labs Reviewed - No data to display  Imaging Review Dg Chest 2 View  01/25/2016  CLINICAL DATA:  Cough, shortness of breath. EXAM: CHEST  2 VIEW COMPARISON:  Radiograph of Dec 22, 2012. FINDINGS: The heart size and mediastinal contours are within normal limits. Both lungs are clear. No pneumothorax or pleural effusion is noted. Status post right shoulder arthroplasty. IMPRESSION: No active cardiopulmonary disease. Electronically Signed   By: Marijo Conception, M.D.   On: 01/25/2016 13:39      MDM   1. Viral URI with cough    X-ray negative for pneumonia or bronchitis. No wheezing to suggest bronchitis. This is likely a viral process that is just settled in her chest. We'll treat with prednisone to help with the inflammation and congestion. Hycodan as needed for cough. Recommended continuing her Zyrtec. Follow-up as needed.    Melony Overly, MD 01/25/16 [phone]

## 2016-01-25 NOTE — ED Notes (Signed)
Patient transported to X-ray -- report given to Denise Hodges

## 2016-03-14 DIAGNOSIS — Z1231 Encounter for screening mammogram for malignant neoplasm of breast: Secondary | ICD-10-CM | POA: Diagnosis not present

## 2016-05-28 DIAGNOSIS — M19021 Primary osteoarthritis, right elbow: Secondary | ICD-10-CM | POA: Diagnosis not present

## 2016-06-27 DIAGNOSIS — M19021 Primary osteoarthritis, right elbow: Secondary | ICD-10-CM | POA: Diagnosis not present

## 2016-08-06 DIAGNOSIS — M19021 Primary osteoarthritis, right elbow: Secondary | ICD-10-CM | POA: Diagnosis not present

## 2016-08-25 DIAGNOSIS — M25562 Pain in left knee: Secondary | ICD-10-CM | POA: Diagnosis not present

## 2016-09-05 DIAGNOSIS — M25521 Pain in right elbow: Secondary | ICD-10-CM | POA: Diagnosis not present

## 2016-09-09 DIAGNOSIS — M659 Synovitis and tenosynovitis, unspecified: Secondary | ICD-10-CM | POA: Diagnosis not present

## 2016-09-24 DIAGNOSIS — I10 Essential (primary) hypertension: Secondary | ICD-10-CM | POA: Diagnosis not present

## 2016-09-24 DIAGNOSIS — J069 Acute upper respiratory infection, unspecified: Secondary | ICD-10-CM | POA: Diagnosis not present

## 2016-09-24 DIAGNOSIS — G4700 Insomnia, unspecified: Secondary | ICD-10-CM | POA: Diagnosis not present

## 2016-09-24 DIAGNOSIS — E782 Mixed hyperlipidemia: Secondary | ICD-10-CM | POA: Diagnosis not present

## 2016-09-24 DIAGNOSIS — Z1389 Encounter for screening for other disorder: Secondary | ICD-10-CM | POA: Diagnosis not present

## 2016-09-24 DIAGNOSIS — H6121 Impacted cerumen, right ear: Secondary | ICD-10-CM | POA: Diagnosis not present

## 2016-09-24 DIAGNOSIS — Z0000 Encounter for general adult medical examination without abnormal findings: Secondary | ICD-10-CM | POA: Diagnosis not present

## 2016-09-24 DIAGNOSIS — M8580 Other specified disorders of bone density and structure, unspecified site: Secondary | ICD-10-CM | POA: Diagnosis not present

## 2016-09-24 DIAGNOSIS — F17200 Nicotine dependence, unspecified, uncomplicated: Secondary | ICD-10-CM | POA: Diagnosis not present

## 2016-10-29 ENCOUNTER — Encounter (HOSPITAL_COMMUNITY): Payer: Self-pay

## 2016-10-29 ENCOUNTER — Encounter (HOSPITAL_COMMUNITY): Payer: Self-pay | Admitting: Emergency Medicine

## 2016-10-29 ENCOUNTER — Observation Stay (HOSPITAL_BASED_OUTPATIENT_CLINIC_OR_DEPARTMENT_OTHER): Payer: Medicare HMO

## 2016-10-29 ENCOUNTER — Ambulatory Visit (HOSPITAL_COMMUNITY): Admit: 2016-10-29 | Payer: BC Managed Care – PPO | Admitting: Internal Medicine

## 2016-10-29 ENCOUNTER — Ambulatory Visit (HOSPITAL_COMMUNITY)
Admission: EM | Admit: 2016-10-29 | Discharge: 2016-10-29 | Disposition: A | Discharge status: Home / Self Care | Payer: BC Managed Care – PPO

## 2016-10-29 ENCOUNTER — Ambulatory Visit (HOSPITAL_COMMUNITY)
Admission: EM | Admit: 2016-10-29 | Discharge: 2016-10-30 | Disposition: A | Discharge status: Home / Self Care | Payer: Medicare HMO | Attending: Internal Medicine | Admitting: Internal Medicine

## 2016-10-29 ENCOUNTER — Emergency Department (HOSPITAL_COMMUNITY): Payer: Medicare HMO

## 2016-10-29 ENCOUNTER — Encounter (HOSPITAL_COMMUNITY)
Admission: EM | Disposition: A | Discharge status: Home / Self Care | Payer: Self-pay | Source: Home / Self Care | Attending: Emergency Medicine

## 2016-10-29 DIAGNOSIS — I441 Atrioventricular block, second degree: Secondary | ICD-10-CM | POA: Diagnosis present

## 2016-10-29 DIAGNOSIS — I4430 Unspecified atrioventricular block: Secondary | ICD-10-CM | POA: Diagnosis present

## 2016-10-29 DIAGNOSIS — M1990 Unspecified osteoarthritis, unspecified site: Secondary | ICD-10-CM | POA: Diagnosis not present

## 2016-10-29 DIAGNOSIS — Z7982 Long term (current) use of aspirin: Secondary | ICD-10-CM | POA: Diagnosis not present

## 2016-10-29 DIAGNOSIS — Z7952 Long term (current) use of systemic steroids: Secondary | ICD-10-CM | POA: Insufficient documentation

## 2016-10-29 DIAGNOSIS — E7800 Pure hypercholesterolemia, unspecified: Secondary | ICD-10-CM | POA: Insufficient documentation

## 2016-10-29 DIAGNOSIS — R001 Bradycardia, unspecified: Secondary | ICD-10-CM | POA: Diagnosis not present

## 2016-10-29 DIAGNOSIS — F17210 Nicotine dependence, cigarettes, uncomplicated: Secondary | ICD-10-CM | POA: Diagnosis not present

## 2016-10-29 DIAGNOSIS — K219 Gastro-esophageal reflux disease without esophagitis: Secondary | ICD-10-CM | POA: Insufficient documentation

## 2016-10-29 DIAGNOSIS — I361 Nonrheumatic tricuspid (valve) insufficiency: Secondary | ICD-10-CM

## 2016-10-29 DIAGNOSIS — I10 Essential (primary) hypertension: Secondary | ICD-10-CM | POA: Insufficient documentation

## 2016-10-29 DIAGNOSIS — I4510 Unspecified right bundle-branch block: Secondary | ICD-10-CM | POA: Insufficient documentation

## 2016-10-29 DIAGNOSIS — R0602 Shortness of breath: Secondary | ICD-10-CM | POA: Insufficient documentation

## 2016-10-29 DIAGNOSIS — R0789 Other chest pain: Secondary | ICD-10-CM

## 2016-10-29 DIAGNOSIS — R05 Cough: Secondary | ICD-10-CM | POA: Diagnosis not present

## 2016-10-29 DIAGNOSIS — Z950 Presence of cardiac pacemaker: Secondary | ICD-10-CM

## 2016-10-29 DIAGNOSIS — R059 Cough, unspecified: Secondary | ICD-10-CM

## 2016-10-29 HISTORY — PX: PACEMAKER IMPLANT: EP1218

## 2016-10-29 LAB — BASIC METABOLIC PANEL
Anion gap: 10 (ref 5–15)
BUN: 9 mg/dL (ref 6–20)
CHLORIDE: 108 mmol/L (ref 101–111)
CO2: 22 mmol/L (ref 22–32)
CREATININE: 0.82 mg/dL (ref 0.44–1.00)
Calcium: 9.5 mg/dL (ref 8.9–10.3)
GFR calc Af Amer: >60 mL/min (ref >60)
GFR calc non Af Amer: >60 mL/min (ref >60)
Glucose, Bld: 89 mg/dL (ref 65–99)
Potassium: 4.3 mmol/L (ref 3.5–5.1)
SODIUM: 140 mmol/L (ref 135–145)

## 2016-10-29 LAB — CBC WITH DIFFERENTIAL/PLATELET
Basophils Absolute: 0 10*3/uL (ref 0.0–0.1)
Basophils Relative: 0 %
EOS ABS: 0.3 10*3/uL (ref 0.0–0.7)
EOS PCT: 4 %
HCT: 34.5 % — ABNORMAL LOW (ref 36.0–46.0)
Hemoglobin: 10.8 g/dL — ABNORMAL LOW (ref 12.0–15.0)
LYMPHS ABS: 2.4 10*3/uL (ref 0.7–4.0)
Lymphocytes Relative: 32 %
MCH: 25.8 pg — AB (ref 26.0–34.0)
MCHC: 31.3 g/dL (ref 30.0–36.0)
MCV: 82.5 fL (ref 78.0–100.0)
MONOS PCT: 11 %
Monocytes Absolute: 0.8 10*3/uL (ref 0.1–1.0)
Neutro Abs: 4 10*3/uL (ref 1.7–7.7)
Neutrophils Relative %: 53 %
PLATELETS: 362 10*3/uL (ref 150–400)
RBC: 4.18 MIL/uL (ref 3.87–5.11)
RDW: 13.5 % (ref 11.5–15.5)
WBC: 7.5 10*3/uL (ref 4.0–10.5)

## 2016-10-29 LAB — ECHOCARDIOGRAM COMPLETE
AOASC: 30 cm
AVLVOTPG: 8 mmHg
CHL CUP DOP CALC LVOT VTI: 39 cm
CHL CUP MV DEC (S): 158
E decel time: 158 msec
E/e' ratio: 17.91
FS: 35 % (ref 28–44)
Height: 64 in
IV/PV OW: 0.71
LA ID, A-P, ES: 32 mm
LA diam index: 1.93 cm/m2
LA vol A4C: 60.8 ml
LAVOL: 60.1 mL
LAVOLIN: 36.3 mL/m2
LDCA: 2.01 cm2
LEFT ATRIUM END SYS DIAM: 32 mm
LV PW d: 14.7 mm — AB (ref 0.6–1.1)
LV TDI E'MEDIAL: 9.62
LVEEAVG: 17.91
LVEEMED: 17.91
LVELAT: 11.5 cm/s
LVOT SV: 78 mL
LVOTD: 16 mm
LVOTPV: 144 cm/s
MV Peak grad: 17 mmHg
MV pk A vel: 115 m/s
MV pk E vel: 206 m/s
PISA EROA: 0.21 cm2
RV TAPSE: 38.3 mm
S' Lateral: 7.15 cm/s
TDI e' lateral: 11.5
VTI: 267 cm
Weight: 2128 oz

## 2016-10-29 LAB — TSH: TSH: 0.654 u[IU]/mL (ref 0.350–4.500)

## 2016-10-29 LAB — BRAIN NATRIURETIC PEPTIDE: B Natriuretic Peptide: 440.1 pg/mL — ABNORMAL HIGH (ref 0.0–100.0)

## 2016-10-29 LAB — MAGNESIUM: MAGNESIUM: 2.4 mg/dL (ref 1.7–2.4)

## 2016-10-29 LAB — TROPONIN I: Troponin I: <0.03 ng/mL (ref <0.03)

## 2016-10-29 SURGERY — PACEMAKER IMPLANT

## 2016-10-29 MED ORDER — CEFAZOLIN SODIUM-DEXTROSE 2-4 GM/100ML-% IV SOLN
INTRAVENOUS | Status: AC
  Filled 2016-10-29: qty 100

## 2016-10-29 MED ORDER — CEFAZOLIN IN D5W 1 GM/50ML IV SOLN
1.0000 g | Freq: Four times a day (QID) | INTRAVENOUS | Status: AC
  Administered 2016-10-29 – 2016-10-30 (×3): 1 g via INTRAVENOUS
  Filled 2016-10-29 (×3): qty 50

## 2016-10-29 MED ORDER — GUAIFENESIN-DM 100-10 MG/5ML PO SYRP
5.0000 mL | ORAL_SOLUTION | ORAL | Status: DC | PRN
  Administered 2016-10-29: 5 mL via ORAL
  Filled 2016-10-29: qty 5

## 2016-10-29 MED ORDER — HEPARIN (PORCINE) IN NACL 2-0.9 UNIT/ML-% IJ SOLN
INTRAMUSCULAR | Status: AC
  Filled 2016-10-29: qty 500

## 2016-10-29 MED ORDER — NITROGLYCERIN 0.4 MG SL SUBL: 0.4000 mg | SUBLINGUAL_TABLET | SUBLINGUAL | Status: DC | PRN

## 2016-10-29 MED ORDER — SODIUM CHLORIDE 0.9 % IV SOLN: 250.0000 mL | INTRAVENOUS | Status: DC

## 2016-10-29 MED ORDER — HEPARIN (PORCINE) IN NACL 2-0.9 UNIT/ML-% IJ SOLN
INTRAMUSCULAR | Status: DC | PRN
  Administered 2016-10-29: 17:00:00

## 2016-10-29 MED ORDER — MIDAZOLAM HCL 5 MG/5ML IJ SOLN
INTRAMUSCULAR | Status: AC
  Filled 2016-10-29: qty 5

## 2016-10-29 MED ORDER — LIDOCAINE HCL (PF) 1 % IJ SOLN
INTRAMUSCULAR | Status: DC | PRN
  Administered 2016-10-29: 51 mL

## 2016-10-29 MED ORDER — SODIUM CHLORIDE 0.9 % IR SOLN
80.0000 mg | Status: AC
  Administered 2016-10-29: 80 mg
  Filled 2016-10-29: qty 2

## 2016-10-29 MED ORDER — CHLORHEXIDINE GLUCONATE 4 % EX LIQD
60.0000 mL | Freq: Once | CUTANEOUS | Status: DC
  Filled 2016-10-29: qty 60

## 2016-10-29 MED ORDER — ONDANSETRON HCL 4 MG/2ML IJ SOLN: 4.0000 mg | Freq: Four times a day (QID) | INTRAMUSCULAR | Status: DC | PRN

## 2016-10-29 MED ORDER — ACETAMINOPHEN 325 MG PO TABS: 650.0000 mg | ORAL_TABLET | ORAL | Status: DC | PRN

## 2016-10-29 MED ORDER — MIDAZOLAM HCL 5 MG/5ML IJ SOLN
INTRAMUSCULAR | Status: DC | PRN
  Administered 2016-10-29 (×3): 1 mg via INTRAVENOUS

## 2016-10-29 MED ORDER — SODIUM CHLORIDE 0.9% FLUSH: 3.0000 mL | Freq: Two times a day (BID) | INTRAVENOUS | Status: DC

## 2016-10-29 MED ORDER — SODIUM CHLORIDE 0.9% FLUSH: 3.0000 mL | INTRAVENOUS | Status: DC | PRN

## 2016-10-29 MED ORDER — FENTANYL CITRATE (PF) 100 MCG/2ML IJ SOLN
INTRAMUSCULAR | Status: DC | PRN
  Administered 2016-10-29: 25 ug via INTRAVENOUS

## 2016-10-29 MED ORDER — LIDOCAINE HCL (PF) 1 % IJ SOLN
INTRAMUSCULAR | Status: AC
  Filled 2016-10-29: qty 60

## 2016-10-29 MED ORDER — IRBESARTAN 150 MG PO TABS
150.0000 mg | ORAL_TABLET | Freq: Every day | ORAL | Status: DC
  Administered 2016-10-29: 150 mg via ORAL
  Filled 2016-10-29: qty 1

## 2016-10-29 MED ORDER — PANTOPRAZOLE SODIUM 40 MG PO TBEC
40.0000 mg | DELAYED_RELEASE_TABLET | Freq: Every day | ORAL | Status: DC
  Administered 2016-10-30: 40 mg via ORAL
  Filled 2016-10-29: qty 1

## 2016-10-29 MED ORDER — CEFAZOLIN SODIUM-DEXTROSE 2-4 GM/100ML-% IV SOLN
2.0000 g | INTRAVENOUS | Status: AC
  Administered 2016-10-29: 2 g via INTRAVENOUS
  Filled 2016-10-29: qty 100

## 2016-10-29 MED ORDER — SODIUM CHLORIDE 0.9 % IV SOLN: INTRAVENOUS | Status: DC

## 2016-10-29 MED ORDER — FENTANYL CITRATE (PF) 100 MCG/2ML IJ SOLN
INTRAMUSCULAR | Status: AC
Start: 2016-10-29 — End: 2016-10-29
  Filled 2016-10-29: qty 2

## 2016-10-29 MED ORDER — ACETAMINOPHEN 325 MG PO TABS
325.0000 mg | ORAL_TABLET | ORAL | Status: DC | PRN
  Administered 2016-10-29 – 2016-10-30 (×2): 650 mg via ORAL
  Filled 2016-10-29 (×2): qty 2

## 2016-10-29 SURGICAL SUPPLY — 12 items
CABLE SURGICAL S-101-97-12 (CABLE) ×6 IMPLANT
CATH RIGHTSITE C315HIS02 (CATHETERS) ×3 IMPLANT
LEAD SELECT SECURE 3830 383069 (Lead) ×1 IMPLANT
LEAD TENDRIL MRI 52CM LPA1200M (Lead) ×3 IMPLANT
PACEMAKER ASSURITY DR-RF (Pacemaker) ×3 IMPLANT
PAD DEFIB LIFELINK (PAD) ×3 IMPLANT
SELECT SECURE 3830 383069 (Lead) ×3 IMPLANT
SHEATH CLASSIC 7F (SHEATH) ×3 IMPLANT
SHEATH CLASSIC 8F (SHEATH) ×3 IMPLANT
SLITTER 6232ADJ (MISCELLANEOUS) ×3 IMPLANT
TRAY PACEMAKER INSERTION (PACKS) ×3 IMPLANT
WIRE HI TORQ VERSACORE-J 145CM (WIRE) ×3 IMPLANT

## 2016-10-29 NOTE — H&P (Signed)
H&P    Patient ID: Denise Hodges MRN: [id number], DOB/AGE: 1946-02-09 71 y.o.  Admit date: 10/29/2016 Date of Consult: 10/29/2016   Primary Physician: Reginia Naas, MD Primary Cardiologist: new to [facility]  Reason for Consultation: bradycardia  HPI: Denise Hodges is a 71 y.o. female sought attention initially at an [facility] with c/o of 3 days os cough/congestion, some degree of SOB, she suspected was some kind of URI, she was noted to be bradycardic in the 40's and referred to the ER for further evaluation.  PMHx is noted for HTN, HLD, GERD, arthritis with no known cardiac history or prior cardiac evaluations.  The patient for a couple months has felt slowed down, unusually fatigued, but no specific symptoms outside of this, though had a brief fainting spell in January that she did not seek attention for.  Today she went to the [facility] for what she feels are cough/cold type symptoms with 3 days of sinus congestion and a cough, no fever.  She was found bradycardiac and sent over.  She has not had recurrent syncope, no near syncope.  No overt SOB, she denies CP, and at rest here in the ED is asymptomatic.  She has not noted any ankle/LE swelling or fluid retention.  LABS: K+ 4.3 BUN/Creat 9/0.82 Trop I <0.03 BNP 440 WBC 7.5 H/H 10.8/34.5 plts 362  Home meds reviewed: not nodal blocking or rate limiting medicines are noted  Past Medical History:  Diagnosis Date  . Arthritis   . GERD (gastroesophageal reflux disease)   . Hyperlipidemia   . Hypertension   . Seasonal allergies      Surgical History:  Past Surgical History:  Procedure Laterality Date  . ABDOMINAL HYSTERECTOMY  1995  . COLONOSCOPY  2013   brodie  . FOOT SURGERY  1985   bilateral for flat feet with pain  . TONSILLECTOMY  as child  . TOTAL SHOULDER ARTHROPLASTY Right 12/30/2012   Procedure: RIGHT TOTAL SHOULDER ARTHROPLASTY;  Surgeon: Nita Sells, MD;  Location: [facility];  Service: Orthopedics;  Laterality:  Right;  interscaline block  . WISDOM TOOTH EXTRACTION        (Not in a hospital admission)  Inpatient Medications:   Allergies:  Allergies  Allergen Reactions  . Macrobid [Nitrofurantoin Macrocrystal] Hives and Shortness Of Breath    Social History   Social History  . Marital status: Married    Spouse name: N/A  . Number of children: N/A  . Years of education: N/A   Occupational History  . Not on file.   Social History Main Topics  . Smoking status: Current Every Day Smoker    Packs/day: 0.25    Types: Cigarettes  . Smokeless tobacco: Never Used  . Alcohol use No  . Drug use: No  . Sexual activity: No   Other Topics Concern  . Not on file   Social History Narrative  . No narrative on file     Family History  Problem Relation Age of Onset  . Dementia Mother   . Colon cancer Maternal Aunt 52  . Colon cancer Cousin 33    maternal  . Rectal cancer Neg Hx   . Stomach cancer Neg Hx      Review of Systems: All other systems reviewed and are otherwise negative except as noted above.  Physical Exam: Vitals:   10/29/16 1111 10/29/16 1116  BP: (!) 162/99   Pulse: (!) 39   Resp: 18   Temp: 98 F (36.7  C)   TempSrc: Oral   SpO2: 100%   Weight:  133 lb (60.3 kg)  Height:  5\' 4"  (1.626 m)    GEN- The patient is well appearing, alert and oriented x 3 today.   HEENT: normocephalic, atraumatic; sclera clear, conjunctiva pink; hearing intact; oropharynx clear; neck supple, no JVP Lymph- no cervical lymphadenopathy Lungs-  CTA b/l, normal work of breathing.  No wheezes, rales, rhonchi Heart- RRR, Bradycardic, no murmurs, rubs or gallops, PMI not laterally displaced GI- soft, non-tender, non-distended Extremities- no clubbing, cyanosis, or edema MS- no significant deformity or atrophy Skin- warm and dry, no rash or lesion Psych- euthymic mood, full affect Neuro- no gross deficits observed  Labs:   Lab Results  Component Value Date   WBC 7.5 10/29/2016     HGB 10.8 (L) 10/29/2016   HCT 34.5 (L) 10/29/2016   MCV 82.5 10/29/2016   PLT 362 10/29/2016    Recent Labs Lab 10/29/16 1115  NA 140  K 4.3  CL 108  CO2 22  BUN 9  CREATININE 0.82  CALCIUM 9.5  GLUCOSE 89      Radiology/Studies:  Dg Chest Portable 1 View Result Date: 10/29/2016 CLINICAL DATA:  Congestion for 3 days, cough EXAM: PORTABLE CHEST 1 VIEW COMPARISON:  Chest x-ray of 01/25/2016 FINDINGS: The does appear to be pulmonary vascular congestion present. No pneumonia or effusion is seen. The heart is mildly enlarged. No bony abnormality is seen. IMPRESSION: Suspect mild pulmonary vascular congestion.  Cardiomegaly. Electronically Signed   By: Ivar Drape M.D.   On: 10/29/2016 11:55   Reviewed by myself: EKG: 2:1 AVblock, 39bpm, RBBB, QRS 141ms TELEMETRY: 2:1 heartblock rates 40's    Assessment and Plan:   1. Symptomatic bradycardia, high degree AVBlock     Dr. Lovena Le discussed with the patient and her husband at bedside PPM implant, risks/benefits and they would like to proceed     Will check TSH, echo      2. HTN     High with her heart block, follow/resume home meds post pacer    Signed, Tommye Standard, PA-C 10/29/2016 1:06 PM  EP Attending  Patient seen and examined. Agree with above. The patient presents with symptomatic 2:1 AV block. She is on no AV nodal blocking drugs. I have reviewed the findings and recommended proceeding with PPM insertion. The risks/benefits/goals/expectations of the procedure have been reviewed and she wishes to proceed.  Mikle Bosworth.D.

## 2016-10-29 NOTE — Interval H&P Note (Signed)
History and Physical Interval Note:  10/29/2016 3:34 PM  Denise Hodges  has presented today for surgery, with the diagnosis of hb  The various methods of treatment have been discussed with the patient and family. After consideration of risks, benefits and other options for treatment, the patient has consented to  Procedure(s): Pacemaker Implant (N/A) as a surgical intervention .  The patient's history has been reviewed, patient examined, no change in status, stable for surgery.  I have reviewed the patient's chart and labs.  Questions were answered to the patient's satisfaction.     Cristopher Peru

## 2016-10-29 NOTE — ED Notes (Signed)
Assisted pt with calling husband, husband aware the pt is going to have procedure completed

## 2016-10-29 NOTE — ED Triage Notes (Signed)
Pt reports feeling congestion and having a dry cough for a month, she started feeling short of breath and dizzy and went to her doctors office, her heart rate was low so he sent her here.  Her heart rate is 39 in triage

## 2016-10-29 NOTE — Progress Notes (Signed)
  Echocardiogram 2D Echocardiogram has been performed.  Denise Hodges 10/29/2016, 3:16 PM

## 2016-10-29 NOTE — ED Provider Notes (Signed)
Denise Hodges DEPT Provider Note   CSN: [id number] Arrival date & time: 10/29/16  1050     History   Chief Complaint Chief Complaint  Patient presents with  . Cough  . Nasal Congestion  . Shortness of Breath    HPI Denise Hodges is a 71 y.o. female.  HPI  71 year old female presents with fatigue. She was seen at urgent care and sent here for bradycardia. She states over the last 1 month or so she has been fatigued. She states it does seem to come and go, not every day. She had a syncopal episode at the end of January and at that point felt like her heart "flipped". The fatigue started later than this however. She has not had any chest pain. Over the last 3 or 4 days she's been having cough and congestion. She also shortness of breath, most prominent when either laying flat on her stomach or flat on her back. No palpitations, dizziness, or lightheadedness. However she feels like she has no energy. No leg swelling.  Past Medical History:  Diagnosis Date  . Arthritis   . GERD (gastroesophageal reflux disease)   . Hyperlipidemia   . Hypertension   . Seasonal allergies     Patient Active Problem List   Diagnosis Date Noted  . Heart block, AV 10/29/2016  . Arthritis of shoulder 12/31/2012  . HTN (hypertension) 10/22/2012  . Pure hypercholesterolemia 10/22/2012    Past Surgical History:  Procedure Laterality Date  . ABDOMINAL HYSTERECTOMY  1995  . COLONOSCOPY  2013   brodie  . FOOT SURGERY  1985   bilateral for flat feet with pain  . TONSILLECTOMY  as child  . TOTAL SHOULDER ARTHROPLASTY Right 12/30/2012   Procedure: RIGHT TOTAL SHOULDER ARTHROPLASTY;  Surgeon: Nita Sells, MD;  Location: [facility];  Service: Orthopedics;  Laterality: Right;  interscaline block  . WISDOM TOOTH EXTRACTION      OB History    No data available       Home Medications    Prior to Admission medications   Medication Sig Start Date End Date Taking? Authorizing Provider    aspirin 81 MG tablet Take 81 mg by mouth daily.   Yes Historical Provider, MD  Calcium Carbonate-Vitamin D (CALCIUM 600 + D PO) Take 1 tablet by mouth daily.   Yes Historical Provider, MD  cetirizine (ZYRTEC) 10 MG tablet Take 10 mg by mouth daily.   Yes Historical Provider, MD  cholecalciferol (VITAMIN D-400) 400 UNITS TABS Take 400 Units by mouth daily.   Yes Historical Provider, MD  [facility] Liver Oil 1000 MG CAPS Take 1 capsule by mouth daily.   Yes Historical Provider, MD  Fenofibrate 150 MG CAPS Take 1 capsule by mouth daily.   Yes Historical Provider, MD  Garlic 10 MG CAPS Take 1 tablet by mouth daily.   Yes Historical Provider, MD  guaiFENesin (MUCINEX) 600 MG 12 hr tablet Take 600 mg by mouth 2 (two) times daily.   Yes Historical Provider, MD  ibuprofen (ADVIL,MOTRIN) 200 MG tablet Take 400 mg by mouth every 6 (six) hours as needed for mild pain.   Yes Historical Provider, MD  omeprazole (PRILOSEC) 20 MG capsule Take 20 mg by mouth daily.   Yes Historical Provider, MD  valsartan (DIOVAN) 160 MG tablet Take 160 mg by mouth at bedtime.    Yes Historical Provider, MD  vitamin C (ASCORBIC ACID) 500 MG tablet Take 500 mg by mouth daily.   Yes Historical  Provider, MD  cyclobenzaprine (FLEXERIL) 10 MG tablet Take 1 tablet (10 mg total) by mouth 2 (two) times daily as needed for muscle spasms. Patient not taking: Reported on 10/29/2016 07/27/15   Konrad Felix, PA  HYDROcodone-homatropine [facility]) 5-1.5 MG/5ML syrup Take 5 mLs by mouth every 6 (six) hours as needed for cough. Patient not taking: Reported on 10/29/2016 01/25/16   Melony Overly, MD  oxyCODONE-acetaminophen (ROXICET) 5-325 MG per tablet Take 1-2 tablets by mouth every 4 (four) hours as needed for pain. Patient not taking: Reported on 10/29/2016 12/31/12   Grier Mitts, PA-C  predniSONE (DELTASONE) 50 MG tablet Take 1 pill daily for 5 days. Patient not taking: Reported on 10/29/2016 01/25/16   Melony Overly, MD    Family History Family  History  Problem Relation Age of Onset  . Dementia Mother   . Colon cancer Maternal Aunt 56  . Colon cancer Cousin 14    maternal  . Rectal cancer Neg Hx   . Stomach cancer Neg Hx     Social History Social History  Substance Use Topics  . Smoking status: Current Every Day Smoker    Packs/day: 0.25    Types: Cigarettes  . Smokeless tobacco: Never Used  . Alcohol use No     Allergies   Macrobid [nitrofurantoin macrocrystal]   Review of Systems Review of Systems  Constitutional: Positive for fatigue and fever.  HENT: Positive for congestion.   Respiratory: Positive for cough and shortness of breath.   Cardiovascular: Negative for chest pain and palpitations.  Gastrointestinal: Negative for abdominal pain.  All other systems reviewed and are negative.    Physical Exam Updated Vital Signs BP (!) 176/62   Pulse (!) 39   Temp 98 F (36.7 C) (Oral)   Resp 11   Ht 5\' 4"  (1.626 m)   Wt 133 lb (60.3 kg)   SpO2 98%   BMI 22.83 kg/m   Physical Exam  Constitutional: She is oriented to person, place, and time. She appears well-developed and well-nourished. No distress.  HENT:  Head: Normocephalic and atraumatic.  Right Ear: External ear normal.  Left Ear: External ear normal.  Nose: Nose normal.  Eyes: Right eye exhibits no discharge. Left eye exhibits no discharge.  Cardiovascular: Regular rhythm and normal heart sounds.  Bradycardia present.   Pulses:      Radial pulses are 2+ on the right side, and 2+ on the left side.  Pulmonary/Chest: Effort normal and breath sounds normal. She has no wheezes. She has no rales.  Abdominal: Soft. There is no tenderness.  Musculoskeletal: She exhibits no edema.  Neurological: She is alert and oriented to person, place, and time.  Skin: Skin is warm and dry. She is not diaphoretic.  Nursing note and vitals reviewed.    ED Treatments / Results  Labs (all labs ordered are listed, but only abnormal results are displayed) Labs  Reviewed  BRAIN NATRIURETIC PEPTIDE - Abnormal; Notable for the following:       Result Value   B Natriuretic Peptide 440.1 (*)    All other components within normal limits  CBC WITH DIFFERENTIAL/PLATELET - Abnormal; Notable for the following:    Hemoglobin 10.8 (*)    HCT 34.5 (*)    MCH 25.8 (*)    All other components within normal limits  BASIC METABOLIC PANEL  MAGNESIUM  TROPONIN I  TSH    EKG  EKG Interpretation  Date/Time:  Wednesday October 29 2016 11:09:39 EDT  Ventricular Rate:  39 PR Interval:  166 QRS Duration: 124 QT Interval:  558 QTC Calculation: 449 R Axis:   99 Text Interpretation:  ** Critical Test Result: AV Block Sinus rhythm with 2nd degree A-V block with 2:1 A-V conduction Right bundle branch block Possible Inferior infarct , age undetermined Anterolateral infarct , age undetermined Abnormal ECG AV block new since 2011 Confirmed by Tahira Olivarez MD, [place] [phone]) on 10/29/2016 12:16:00 PM       Radiology Dg Chest Portable 1 View  Result Date: 10/29/2016 CLINICAL DATA:  Congestion for 3 days, cough EXAM: PORTABLE CHEST 1 VIEW COMPARISON:  Chest x-ray of 01/25/2016 FINDINGS: The does appear to be pulmonary vascular congestion present. No pneumonia or effusion is seen. The heart is mildly enlarged. No bony abnormality is seen. IMPRESSION: Suspect mild pulmonary vascular congestion.  Cardiomegaly. Electronically Signed   By: Ivar Drape M.D.   On: 10/29/2016 11:55    Procedures Procedures (including critical care time)  Medications Ordered in ED Medications  nitroGLYCERIN (NITROSTAT) SL tablet 0.4 mg (not administered)  acetaminophen (TYLENOL) tablet 650 mg (not administered)     Initial Impression / Assessment and Plan / ED Course  I have reviewed the triage vital signs and the nursing notes.  Pertinent labs & imaging results that were available during my care of the patient were reviewed by me and considered in my medical decision making (see chart for  details).  Clinical Course as of Oct 29 1509  Wed Oct 29, 2016  1131 Consult cardiology given symptomatic bradycardia. ECG c/w Type 2 2nd degree AV block  [SG]    Clinical Course User Index [SG] Sherwood Gambler, MD    Patient currently is stable although she has had subacute symptoms for about a month. I believe this is attributable to her heart block. Blood pressure is hypertensive. There is no beta blocker or other obvious medicine that would cause this. No significant electrolyte disturbance. Admit to cardiology.  Final Clinical Impressions(s) / ED Diagnoses   Final diagnoses:  Heart block AV second degree    New Prescriptions New Prescriptions   No medications on file     Sherwood Gambler, MD 10/29/16 1514

## 2016-10-29 NOTE — ED Triage Notes (Signed)
Onset of cough, congestion started on Sunday.  Denies fever, no runny nose.  Reports frequent coughing, non-productive cough  History of reflux, hiatal hernia.  Reports these symptoms started reoccurring last week

## 2016-10-29 NOTE — ED Provider Notes (Signed)
CSN: [id number]     Arrival date & time 10/29/16  1006 History   First MD Initiated Contact with Patient 10/29/16 1035     Chief Complaint  Patient presents with  . URI   (Consider location/radiation/quality/duration/timing/severity/associated sxs/prior Treatment) Patient c/o cough and congestion for 3 days.  She c/o weakness and fatigue.  She c/o "funny feeling in her chest" and some SOB.   The history is provided by the patient.  URI  Presenting symptoms: congestion, cough and fatigue   Severity:  Mild Onset quality:  Sudden Duration:  3 days Timing:  Constant Chronicity:  New Relieved by:  None tried Worsened by:  Nothing Ineffective treatments:  None tried   Past Medical History:  Diagnosis Date  . Arthritis   . GERD (gastroesophageal reflux disease)   . Hyperlipidemia   . Hypertension   . Seasonal allergies    Past Surgical History:  Procedure Laterality Date  . ABDOMINAL HYSTERECTOMY  1995  . COLONOSCOPY  2013   brodie  . FOOT SURGERY  1985   bilateral for flat feet with pain  . TONSILLECTOMY  as child  . TOTAL SHOULDER ARTHROPLASTY Right 12/30/2012   Procedure: RIGHT TOTAL SHOULDER ARTHROPLASTY;  Surgeon: Nita Sells, MD;  Location: [facility];  Service: Orthopedics;  Laterality: Right;  interscaline block  . WISDOM TOOTH EXTRACTION     Family History  Problem Relation Age of Onset  . Dementia Mother   . Colon cancer Maternal Aunt 20  . Colon cancer Cousin 34    maternal  . Rectal cancer Neg Hx   . Stomach cancer Neg Hx    Social History  Substance Use Topics  . Smoking status: Current Every Day Smoker    Packs/day: 0.25    Types: Cigarettes  . Smokeless tobacco: Never Used  . Alcohol use No   OB History    No data available     Review of Systems  Constitutional: Positive for fatigue.  HENT: Positive for congestion.   Eyes: Negative.   Respiratory: Positive for cough and shortness of breath.   Cardiovascular: Positive for chest pain.   Gastrointestinal: Negative.   Endocrine: Negative.   Genitourinary: Negative.   Musculoskeletal: Negative.   Allergic/Immunologic: Negative.   Neurological: Negative.   Hematological: Negative.   Psychiatric/Behavioral: Negative.     Allergies  Macrobid [nitrofurantoin macrocrystal]  Home Medications   Prior to Admission medications   Medication Sig Start Date End Date Taking? Authorizing Provider  aspirin 81 MG tablet Take 81 mg by mouth daily.    Historical Provider, MD  Calcium Carbonate-Vitamin D (CALCIUM 600 + D PO) Take 1 tablet by mouth daily.    Historical Provider, MD  cetirizine (ZYRTEC) 10 MG tablet Take 10 mg by mouth daily.    Historical Provider, MD  cholecalciferol (VITAMIN D-400) 400 UNITS TABS Take 400 Units by mouth daily.    Historical Provider, MD  cyclobenzaprine (FLEXERIL) 10 MG tablet Take 1 tablet (10 mg total) by mouth 2 (two) times daily as needed for muscle spasms. 07/27/15   Konrad Felix, PA  cycloSPORINE (RESTASIS) 0.05 % ophthalmic emulsion Place 1 drop into both eyes 2 (two) times daily.    Historical Provider, MD  Fenofibrate 150 MG CAPS Take 1 capsule by mouth daily.    Historical Provider, MD  HYDROcodone-homatropine (HYCODAN) 5-1.5 MG/5ML syrup Take 5 mLs by mouth every 6 (six) hours as needed for cough. 01/25/16   Melony Overly, MD  lansoprazole (PREVACID)  15 MG capsule Take 15 mg by mouth as needed.    Historical Provider, MD  oxyCODONE-acetaminophen (ROXICET) 5-325 MG per tablet Take 1-2 tablets by mouth every 4 (four) hours as needed for pain. 12/31/12   Grier Mitts, PA-C  predniSONE (DELTASONE) 50 MG tablet Take 1 pill daily for 5 days. 01/25/16   Melony Overly, MD  valsartan (DIOVAN) 160 MG tablet Take 160 mg by mouth at bedtime.     Historical Provider, MD  vitamin C (ASCORBIC ACID) 500 MG tablet Take 500 mg by mouth daily.    Historical Provider, MD   Meds Ordered and Administered this Visit  Medications - No data to display  BP (!)  197/68 (BP Location: Right Arm)   Pulse (!) 40   Temp 98.4 F (36.9 C) (Oral)   Resp 18   SpO2 100%  No data found.   Physical Exam  Constitutional: She appears well-developed and well-nourished.  HENT:  Head: Normocephalic and atraumatic.  Right Ear: External ear normal.  Left Ear: External ear normal.  Mouth/Throat: Oropharynx is clear and moist.  Eyes: Conjunctivae and EOM are normal. Pupils are equal, round, and reactive to light.  Neck: Normal range of motion. Neck supple.  Cardiovascular: Normal rate and normal heart sounds.   Bradycardia  Pulmonary/Chest: Effort normal and breath sounds normal.  Abdominal: Soft. Bowel sounds are normal.  Nursing note and vitals reviewed.   Urgent Care Course     Procedures (including critical care time)  Labs Review Labs Reviewed - No data to display  Imaging Review No results found.   Visual Acuity Review  Right Eye Distance:   Left Eye Distance:   Bilateral Distance:    Right Eye Near:   Left Eye Near:    Bilateral Near:         MDM   1. Bradycardia   2. Other chest pain   3. Cough    Explained to patient she needs to go to ED for higher level of care.      Lysbeth Penner, FNP 10/29/16 1049

## 2016-10-30 ENCOUNTER — Encounter (HOSPITAL_COMMUNITY): Payer: Self-pay

## 2016-10-30 ENCOUNTER — Ambulatory Visit (HOSPITAL_COMMUNITY): Payer: Medicare HMO

## 2016-10-30 DIAGNOSIS — E7800 Pure hypercholesterolemia, unspecified: Secondary | ICD-10-CM | POA: Diagnosis not present

## 2016-10-30 DIAGNOSIS — I441 Atrioventricular block, second degree: Secondary | ICD-10-CM | POA: Diagnosis not present

## 2016-10-30 DIAGNOSIS — Z950 Presence of cardiac pacemaker: Secondary | ICD-10-CM | POA: Diagnosis not present

## 2016-10-30 DIAGNOSIS — R001 Bradycardia, unspecified: Secondary | ICD-10-CM | POA: Diagnosis not present

## 2016-10-30 DIAGNOSIS — I10 Essential (primary) hypertension: Secondary | ICD-10-CM | POA: Diagnosis not present

## 2016-10-30 DIAGNOSIS — F17210 Nicotine dependence, cigarettes, uncomplicated: Secondary | ICD-10-CM | POA: Diagnosis not present

## 2016-10-30 DIAGNOSIS — Z7982 Long term (current) use of aspirin: Secondary | ICD-10-CM | POA: Diagnosis not present

## 2016-10-30 DIAGNOSIS — M1990 Unspecified osteoarthritis, unspecified site: Secondary | ICD-10-CM | POA: Diagnosis not present

## 2016-10-30 DIAGNOSIS — I4510 Unspecified right bundle-branch block: Secondary | ICD-10-CM | POA: Diagnosis not present

## 2016-10-30 DIAGNOSIS — J9811 Atelectasis: Secondary | ICD-10-CM | POA: Diagnosis not present

## 2016-10-30 DIAGNOSIS — K219 Gastro-esophageal reflux disease without esophagitis: Secondary | ICD-10-CM | POA: Diagnosis not present

## 2016-10-30 MED ORDER — CARVEDILOL 6.25 MG PO TABS
6.2500 mg | ORAL_TABLET | Freq: Two times a day (BID) | ORAL | Status: DC
  Administered 2016-10-30: 6.25 mg via ORAL
  Filled 2016-10-30: qty 1

## 2016-10-30 MED ORDER — WHITE PETROLATUM GEL
Status: AC
  Filled 2016-10-30: qty 1

## 2016-10-30 MED ORDER — CARVEDILOL 6.25 MG PO TABS: 6.2500 mg | ORAL_TABLET | Freq: Two times a day (BID) | ORAL | 3 refills | Status: DC

## 2016-10-30 NOTE — Discharge Summary (Signed)
ELECTROPHYSIOLOGY PROCEDURE DISCHARGE SUMMARY    Patient ID: Denise Hodges,  MRN: [id number], DOB/AGE: 1945/11/12 71 y.o.  Admit date: 10/29/2016 Discharge date: 10/30/2016  Primary Care Physician: Reginia Naas, MD  Primary Cardiologist: new to Dr. Lovena Le  Primary Discharge Diagnosis:  1. Symptomatic bradycardia 2. High degree AV block, 2:1 block  Secondary Discharge Diagnosis:  1. HTN  Allergies  Allergen Reactions  . Macrobid [Nitrofurantoin Macrocrystal] Hives and Shortness Of Breath     Procedures This Admission:  1.  Implantation of a SJM dual chamber PPM on 10/29/16 by Dr Lovena Le.  The patient received a St. Jude (serial number [id number]) pacemaker, St. Jude (serial number [id number]) right atrial lead and a Med[employer]onic (serial number [id number]) right ventricular/His bundle lead  There were no immediate post procedure complications. 2.  CXR on 10/30/16 demonstrated no pneumothorax status post device implantation.   Brief HPI: Denise Hodges is a 71 y.o. female was referred to the ER from an [facility] where she was seeking attention for URI symptoms, she was incidentally noted to be bradycardic and found to have 2:1 AV block with V rates 30's-40s, admitted for further evaluation and management.  Hospital Course:  The patient was admitted, no reversible causes for her heart block were noted and planed for PPM implant.  The patient reported for a couple months has felt slowed down, unusually fatigued, but no specific symptoms outside of this, though had a brief fainting spell in January that she did not seek attention for.  She has not had recurrent syncope, no near syncope.  No overt SOB, she denied CP, no noted any ankle/LE swelling or fluid retention. Echo noted EF 65-70%, mod LVH.   She underwent implantation of a PPM with details as outlined above.  She was monitored on telemetry overnight which demonstrated SR/V paced rhythm.  Left chest was without hematoma or ecchymosis.   The device was interrogated and found to be functioning normally.  CXR was obtained and demonstrated no pneumothorax status post device implantation.  Reviewed with Dr. Lovena Le, given initial c/o were of URI symptoms.  Given normal WBC and afebrile, no antibiotics or Prescribed treatment, the patient has been instructed to monitor this, should she develop fever, her cough become productive or purulent or not resolve to see her PMD.  Wound care, arm mobility, and restrictions were reviewed with the patient.  Pt c/o headache, her BP has been elevated,we will start Coreg for additional BP management.  The patient was examined by Dr. Lovena Le and considered stable for discharge to home.    Physical Exam: Vitals:   10/30/16 0115 10/30/16 0124 10/30/16 0138 10/30/16 0427  BP: (!) 238/65 (!) 178/70 (!) 177/72 (!) 183/78  Pulse:  87  76  Resp:    16  Temp:    98 F (36.7 C)  TempSrc:    Oral  SpO2:    100%  Weight:      Height:        GEN- The patient is well appearing, alert and oriented x 3 today.   HEENT: normocephalic, atraumatic; sclera clear, conjunctiva pink; hearing intact; oropharynx clear; neck supple, no JVP Lungs- CTA b/l, normal work of breathing.  No wheezes, rales, rhonchi Heart- RRR, no murmurs, rubs or gallops, PMI not laterally displaced GI- soft, non-tender, non-distended Extremities- no clubbing, cyanosis, or edema MS- no significant deformity or atrophy Skin- warm and dry, no rash or lesion, left chest without hematoma/ecchymosis Psych- euthymic mood, full affect Neuro-  no gross deficits   Labs:   Lab Results  Component Value Date   WBC 7.5 10/29/2016   HGB 10.8 (L) 10/29/2016   HCT 34.5 (L) 10/29/2016   MCV 82.5 10/29/2016   PLT 362 10/29/2016     Recent Labs Lab 10/29/16 1115  NA 140  K 4.3  CL 108  CO2 22  BUN 9  CREATININE 0.82  CALCIUM 9.5  GLUCOSE 89    Discharge Medications:  Allergies as of 10/30/2016      Reactions   Macrobid [nitrofurantoin  Macrocrystal] Hives, Shortness Of Breath      Medication List    TAKE these medications   aspirin 81 MG tablet Take 81 mg by mouth daily.   CALCIUM 600 + D PO Take 1 tablet by mouth daily.   carvedilol 6.25 MG tablet Commonly known as:  COREG Take 1 tablet (6.25 mg total) by mouth 2 (two) times daily with a meal.   cetirizine 10 MG tablet Commonly known as:  ZYRTEC Take 10 mg by mouth daily.   Cod Liver Oil 1000 MG Caps Take 1 capsule by mouth daily.   cyclobenzaprine 10 MG tablet Commonly known as:  FLEXERIL Take 1 tablet (10 mg total) by mouth 2 (two) times daily as needed for muscle spasms.   Fenofibrate 150 MG Caps Take 1 capsule by mouth daily.   Garlic 10 MG Caps Take 1 tablet by mouth daily.   guaiFENesin 600 MG 12 hr tablet Commonly known as:  MUCINEX Take 600 mg by mouth 2 (two) times daily.   HYDROcodone-homatropine 5-1.5 MG/5ML syrup Commonly known as:  HYCODAN Take 5 mLs by mouth every 6 (six) hours as needed for cough.   ibuprofen 200 MG tablet Commonly known as:  ADVIL,MOTRIN Take 400 mg by mouth every 6 (six) hours as needed for mild pain.   omeprazole 20 MG capsule Commonly known as:  PRILOSEC Take 20 mg by mouth daily.   oxyCODONE-acetaminophen 5-325 MG tablet Commonly known as:  ROXICET Take 1-2 tablets by mouth every 4 (four) hours as needed for pain. Notes to patient:  This medicine is listed as completed, please discuss with the original prescribing physician regarding this medicine   predniSONE 50 MG tablet Commonly known as:  DELTASONE Take 1 pill daily for 5 days. Notes to patient:  This medicine is listed as completed, please discuss with the original prescribing physician regarding this medicine   valsartan 160 MG tablet Commonly known as:  DIOVAN Take 160 mg by mouth at bedtime.   vitamin C 500 MG tablet Commonly known as:  ASCORBIC ACID Take 500 mg by mouth daily.   VITAMIN D-400 400 units Tabs tablet Generic drug:   cholecalciferol Take 400 Units by mouth daily.       Disposition:  Home Discharge Instructions    Diet - low sodium heart healthy    Complete by:  As directed    Increase activity slowly    Complete by:  As directed      Follow-up Information    [facility] Follow up on 11/10/2016.   Specialty:  Cardiology Why:  3:30PM, wound check Contact information: [address] [place]       Cristopher Peru, MD Follow up on 01/26/2017.   Specialty:  Cardiology Why:  12:15PM Contact information: [address] [place] [address] [phone]           Duration  of Discharge Encounter: Greater than 30 minutes including physician time.  Venetia Night, PA-C 10/30/2016 9:43 AM  EP Attending  Patient seen and examined. Agree with above. Her DDD PM is working normally and has been interogated. Usual followup.   Mikle Bosworth.D.

## 2016-10-30 NOTE — Progress Notes (Signed)
Reviewed discharge with patient. No issues at present. Iv removed. Patient waiting transport.  Rayder Sullenger, Mervin Kung RN

## 2016-10-30 NOTE — Discharge Instructions (Signed)
° ° °  Supplemental Discharge Instructions for  Pacemaker/Defibrillator Patients  Activity No heavy lifting or vigorous activity with your left/right arm for 6 to 8 weeks.  Do not raise your left/right arm above your head for one week.  Gradually raise your affected arm as drawn below.             11/02/16                        11/03/16                      11/04/16                   11/05/16 __  NO DRIVING for 1 week  ; you may begin driving on  12/12/59   .  WOUND CARE - Keep the wound area clean and dry.  Do not get this area wet for one week. No showers for one week; you may shower on  11/05/16  . - The tape/steri-strips on your wound will fall off; do not pull them off.  No bandage is needed on the site.  DO  NOT apply any creams, oils, or ointments to the wound area. - If you notice any drainage or discharge from the wound, any swelling or bruising at the site, or you develop a fever > 101? F after you are discharged home, call the office at once.  Special Instructions - You are still able to use cellular telephones; use the ear opposite the side where you have your pacemaker/defibrillator.  Avoid carrying your cellular phone near your device. - When traveling through airports, show security personnel your identification card to avoid being screened in the metal detectors.  Ask the security personnel to use the hand wand. - Avoid arc welding equipment, MRI testing (magnetic resonance imaging), TENS units (transcutaneous nerve stimulators).  Call the office for questions about other devices. - Avoid electrical appliances that are in poor condition or are not properly grounded. - Microwave ovens are safe to be near or to operate.  Additional information for defibrillator patients should your device go off: - If your device goes off ONCE and you feel fine afterward, notify the device clinic nurses. - If your device goes off ONCE and you do not feel well afterward, call 911. - If your device  goes off TWICE, call 911. - If your device goes off THREE times in one day, call 911.  DO NOT DRIVE YOURSELF OR A FAMILY MEMBER WITH A DEFIBRILLATOR TO THE HOSPITAL--CALL 911.

## 2016-11-04 DIAGNOSIS — Z950 Presence of cardiac pacemaker: Secondary | ICD-10-CM | POA: Diagnosis not present

## 2016-11-04 DIAGNOSIS — I441 Atrioventricular block, second degree: Secondary | ICD-10-CM | POA: Diagnosis not present

## 2016-11-05 ENCOUNTER — Telehealth: Payer: Self-pay | Admitting: Internal Medicine

## 2016-11-05 NOTE — Telephone Encounter (Signed)
New message    Denise Hodges is calling from Dr. Uvaldo Bristle office with the phone number to call for the peer to peer. [phone] the cardiac pace maker request.

## 2016-11-05 NOTE — Telephone Encounter (Signed)
Spoke w/ Marzetta Board who explains that this may/may not be a billing issue.   She reports procedure on 14-Nov-2022 being coded as a generator change plus dual leads.  They need clarification as to why more leads were placed.  Informed Marzetta Board that code is incorrect.  Explained pt did not originally have a device in and that the November 14, 2022 procedure was a pacemaker implant and not a generator change.  Marzetta Board will get coding fixed.  Peer to peer not needed

## 2016-11-10 ENCOUNTER — Ambulatory Visit: Payer: BC Managed Care – PPO

## 2016-11-26 ENCOUNTER — Ambulatory Visit (INDEPENDENT_AMBULATORY_CARE_PROVIDER_SITE_OTHER): Payer: Medicare HMO | Admitting: *Deleted

## 2016-11-26 DIAGNOSIS — I441 Atrioventricular block, second degree: Secondary | ICD-10-CM

## 2016-11-26 DIAGNOSIS — Z950 Presence of cardiac pacemaker: Secondary | ICD-10-CM | POA: Diagnosis not present

## 2016-11-26 LAB — CUP PACEART INCLINIC DEVICE CHECK
Battery Voltage: 2.99 V
Brady Statistic RV Percent Paced: 99.89 %
Date Time Interrogation Session: 20180502151011
Implantable Lead Implant Date: 20180404
Implantable Lead Location: 753860
Implantable Lead Model: 3830
Implantable Pulse Generator Implant Date: 20180404
Lead Channel Impedance Value: 612.5 Ohm
Lead Channel Impedance Value: 612.5 Ohm
Lead Channel Impedance Value: 625 Ohm
Lead Channel Pacing Threshold Amplitude: 0.5 V
Lead Channel Pacing Threshold Pulse Width: 0.4 ms
Lead Channel Pacing Threshold Pulse Width: 1 ms
Lead Channel Sensing Intrinsic Amplitude: 12 mV
Lead Channel Setting Pacing Amplitude: 3.5 V
Lead Channel Setting Pacing Amplitude: 3.5 V
Lead Channel Setting Sensing Sensitivity: 2 mV
MDC IDC LEAD IMPLANT DT: 20180404
MDC IDC LEAD LOCATION: 753859
MDC IDC MSMT LEADCHNL RA SENSING INTR AMPL: 5 mV
MDC IDC MSMT LEADCHNL RV IMPEDANCE VALUE: 625 Ohm
MDC IDC MSMT LEADCHNL RV PACING THRESHOLD AMPLITUDE: 0.5 V
MDC IDC PG SERIAL: 8002284
MDC IDC SET LEADCHNL RV PACING PULSEWIDTH: 1 ms
MDC IDC STAT BRADY RA PERCENT PACED: 1.5 %

## 2016-11-26 NOTE — Progress Notes (Signed)
Wound check appointment with industry rep present. Steri-strips removed. Wound without redness or edema. Incision edges approximated, wound well healed. Normal device function. Thresholds, sensing, and impedances consistent with implant measurements. Non-selective RV (His) capture noted from 5.0V to LOC at 0.25V (threshold 0.5V @ 1.60ms), selective capture not noted on rhythm strip. Device programmed at 3.5V for extra safety margin until 3 month visit. Histogram distribution appropriate for patient and level of activity. No mode switches or high ventricular rates noted. Patient educated about wound care, arm mobility, lifting restrictions. ROV with GT on 01/26/17.

## 2016-12-15 ENCOUNTER — Telehealth: Payer: Self-pay | Admitting: Internal Medicine

## 2016-12-15 NOTE — Telephone Encounter (Signed)
Called, spoke with pt. Informed pt will not need a premed prior to cleaning on 12/19/16. Pt verbalized understanding.

## 2016-12-15 NOTE — Telephone Encounter (Signed)
New message    Pt is having a dental cleaning on 12/18/16 11a , she just had pacemaker put in, does she need premed    1. What dental office are you calling from? [facility]   2. What is your office phone and fax number? [phone]  3. What type of procedure is the patient having performed? Clean and exam  4. What date is procedure scheduled? 12/18/16  5. What is your question (ex. Antibiotics prior to procedure, holding medication-we need to know how long dentist wants pt to hold med)?  Does she need premed or withhold any medicines?  Pt called this in

## 2016-12-31 ENCOUNTER — Telehealth: Payer: Self-pay | Admitting: Internal Medicine

## 2016-12-31 NOTE — Telephone Encounter (Signed)
Called, spoke with pt. Pt stated she has been feeling tired since she started Carvedilol on 10/30/16. Pt had PPM place by Dr. Lovena Le on 10/29/16. Asked if pt takes BP and HR daily - pt stated "no". Asked last BP reading. Pt stated at her DDS about 2 weeks ago BP 150/? (pt unsure of diastolic number). Gave instructions over the phone how to take radial pulse. Pt's pulse was 56. Pt denied Cp or SOB. Informed Dr. Lovena Le is out of the office, but I will forward to Tommye Standard, PA for recommendation.

## 2016-12-31 NOTE — Telephone Encounter (Signed)
Denise Hodges is calling because she has been placed on a new blood pressure medication(Carvedilol(Coreg) 6.25mg  and she states that it is she so sleepy and no energy . She takes it twice a day . Please call   Thanks

## 2016-12-31 NOTE — Telephone Encounter (Signed)
Called, spoke with pt. Informed Denise Standard, PA recommended pt to continue current meds and be scheduled for a BP check with Pharmacist, or nurse visit. Informed someone will call from our office to schedule. Informed to call our office with questions, concerns, or worsening symptoms. Pt verbalized understanding and thanked me for calling.

## 2017-01-01 NOTE — Telephone Encounter (Signed)
Pt scheduled for HTN visit next week on 6/14.

## 2017-01-02 ENCOUNTER — Telehealth: Payer: Self-pay | Admitting: Internal Medicine

## 2017-01-02 NOTE — Telephone Encounter (Signed)
New message    Pt is calling to give report to RN. 135/68 p-74 just 30 minutes ago.

## 2017-01-02 NOTE — Telephone Encounter (Signed)
On 12/31/16 I requested pt call us with BP and HR. Pt called today to report BP 135/68 and HR 74.

## 2017-01-08 ENCOUNTER — Ambulatory Visit (INDEPENDENT_AMBULATORY_CARE_PROVIDER_SITE_OTHER): Payer: Medicare HMO | Admitting: Pharmacist

## 2017-01-08 VITALS — BP 132/66 | HR 60 | Wt 132.1 lb

## 2017-01-08 DIAGNOSIS — I10 Essential (primary) hypertension: Secondary | ICD-10-CM | POA: Diagnosis not present

## 2017-01-08 NOTE — Patient Instructions (Signed)
It was great to meet you today!  Continue carvedilol and valsartan. Keep measuring your BP at home, and keep a record with date and time. Follow-up with [place] in 4 weeks.

## 2017-01-08 NOTE — Progress Notes (Signed)
Patient ID: HAN LYSNE                 DOB: 04-28-1946                      MRN: [id number]     HPI: Denise Hodges is a 71 y.o. female patient of Dr. Lovena Le to HTN clinic. PMH is significant for HTN, HLD, and GERD. She was recently hospitalized in 10/2016 for symptomatic bradycardia with 2:1 AV block. During this hospitalization she had a pacemaker placed, and was started on carvedilol for BP elevation and c/o headache. Of note, patient called the clinic on 12/31/16 complaining of fatigue since starting carvedilol. She was referred to the HTN clinic for BP check.   Patient presents today in good spirits without assistance for HTN clinic establishment. She denies dizziness, headache, and blurred vision. She states her fatigue has improved since the beginning of the month, and she is back to baseline. She didn't bring in her BP reading, but states that her home BP readings ~135/60s. She took her BP medications this morning.    Of note, patient has been decreasing cigarette use from 4-5 cigarettes/day to 2 per day. She states that patches don't help, and that her biggest barrier is the desire to smoke after meals. We discussed smoking cessation medications, and she is not interested in pursuing these at this time.  Current HTN meds:  Carvedilol 6.25 mg bid Valsartan 160 mg daily (AM)   Previously tried: none  BP goal: < 130/80 mmHg  Family History: no cardiac history  Social History: Current smoker (2 cigarettes/day), denies alcohol use  Diet: Adheres to low sodium, decaf coffee/tea   Exercise: Currently not exercising - hopes to start walking soon  Home BP readings: SBP ~130s, 1 reading of 140s, no 150s; DBP < 80s   Wt Readings from Last 3 Encounters:  01/08/17 132 lb 1.9 oz (59.9 kg)  10/29/16 133 lb (60.3 kg)  12/30/12 141 lb (64 kg)   BP Readings from Last 3 Encounters:  01/08/17 132/66  10/30/16 (!) 183/78  10/29/16 (!) 197/68   Pulse Readings from Last 3 Encounters:    01/08/17 60  10/30/16 76  10/29/16 (!) 40    Renal function: CrCl cannot be calculated (Patient's most recent lab result is older than the maximum 21 days allowed.).  Past Medical History:  Diagnosis Date  . Arthritis   . GERD (gastroesophageal reflux disease)   . Hyperlipidemia   . Hypertension   . Seasonal allergies     Current Outpatient Prescriptions on File Prior to Visit  Medication Sig Dispense Refill  . aspirin 81 MG tablet Take 81 mg by mouth daily.    . Calcium Carbonate-Vitamin D (CALCIUM 600 + D PO) Take 1 tablet by mouth daily.    . carvedilol (COREG) 6.25 MG tablet Take 1 tablet (6.25 mg total) by mouth 2 (two) times daily with a meal. 60 tablet 3  . cetirizine (ZYRTEC) 10 MG tablet Take 10 mg by mouth daily.    . cholecalciferol (VITAMIN D-400) 400 UNITS TABS Take 400 Units by mouth daily.    Marland Kitchen Cod Liver Oil 1000 MG CAPS Take 1 capsule by mouth daily.    . cyclobenzaprine (FLEXERIL) 10 MG tablet Take 1 tablet (10 mg total) by mouth 2 (two) times daily as needed for muscle spasms. (Patient not taking: Reported on 10/29/2016) 20 tablet 0  . Fenofibrate 150 MG CAPS Take 1  capsule by mouth daily.    . Garlic 10 MG CAPS Take 1 tablet by mouth daily.    Marland Kitchen guaiFENesin (MUCINEX) 600 MG 12 hr tablet Take 600 mg by mouth 2 (two) times daily.    Marland Kitchen HYDROcodone-homatropine (HYCODAN) 5-1.5 MG/5ML syrup Take 5 mLs by mouth every 6 (six) hours as needed for cough. (Patient not taking: Reported on 10/29/2016) 120 mL 0  . ibuprofen (ADVIL,MOTRIN) 200 MG tablet Take 400 mg by mouth every 6 (six) hours as needed for mild pain.    Marland Kitchen omeprazole (PRILOSEC) 20 MG capsule Take 20 mg by mouth daily.    Marland Kitchen oxyCODONE-acetaminophen (ROXICET) 5-325 MG per tablet Take 1-2 tablets by mouth every 4 (four) hours as needed for pain. (Patient not taking: Reported on 10/29/2016) 60 tablet 0  . predniSONE (DELTASONE) 50 MG tablet Take 1 pill daily for 5 days. (Patient not taking: Reported on 10/29/2016) 5 tablet  0  . valsartan (DIOVAN) 160 MG tablet Take 160 mg by mouth at bedtime.     . vitamin C (ASCORBIC ACID) 500 MG tablet Take 500 mg by mouth daily.     No current facility-administered medications on file prior to visit.     Allergies  Allergen Reactions  . Macrobid [Nitrofurantoin Macrocrystal] Hives and Shortness Of Breath    Assessment/Plan:  1. Hypertension - BP very close to goal of < 130/80 mmHg. Will continue carvedilol 6.25mg  bid and valsartan 160mg  qd. Encouraged patient to quit smoking, keep adhering to a low-sodium diet, and to increase physical activity. Follow-up with HTN clinic in 4 weeks.   Belia Heman, PharmD PGY1 Resident 01/08/2017 9:08 AM  Patient seen with: Fuller Canada, PharmD, CPP, [facility] [address] [place], [place] [address] Phone: [phone]; Fax: [phone]

## 2017-01-26 ENCOUNTER — Ambulatory Visit (INDEPENDENT_AMBULATORY_CARE_PROVIDER_SITE_OTHER): Payer: Medicare HMO | Admitting: Internal Medicine

## 2017-01-26 ENCOUNTER — Encounter: Payer: Self-pay | Admitting: Internal Medicine

## 2017-01-26 VITALS — BP 120/64 | HR 60 | Ht 64.0 in | Wt 133.8 lb

## 2017-01-26 DIAGNOSIS — Z950 Presence of cardiac pacemaker: Secondary | ICD-10-CM

## 2017-01-26 DIAGNOSIS — I441 Atrioventricular block, second degree: Secondary | ICD-10-CM | POA: Diagnosis not present

## 2017-01-26 NOTE — Patient Instructions (Signed)
Medication Instructions:  Your physician recommends that you continue on your current medications as directed. Please refer to the Current Medication list given to you today.  Labwork: None ordered.  Testing/Procedures: None ordered.  Follow-Up: Your physician wants you to follow-up in: 9 months with Dr. Lovena Le.  You will receive a reminder letter in the mail two months in advance. If you don't receive a letter, please call our office to schedule the follow-up appointment.  Remote monitoring is used to monitor your PPM from home. This monitoring reduces the number of office visits required to check your device to one time per year. It allows [place] to keep an eye on the functioning of your device to ensure it is working properly. You are scheduled for a device check from home on 04/27/2017. You may send your transmission at any time that day. If you have a wireless device, the transmission will be sent automatically. After your physician reviews your transmission, you will receive a postcard with your next transmission date.    Any Other Special Instructions Will Be Listed Below (If Applicable).   If you need a refill on your cardiac medications before your next appointment, please call your pharmacy.

## 2017-01-26 NOTE — Progress Notes (Signed)
HPI Mrs. Speedy returns today for followup. She is a pleasant 71 yo woman with RBBB who was found to have symptomatic 2:1 AV block and underwent PPM insertion 3 months ago. She has done well in the interim with no chest pain, sob, or syncope. He blood pressure is under better control. No edema. Allergies  Allergen Reactions  . Macrobid [Nitrofurantoin Macrocrystal] Hives and Shortness Of Breath     Current Outpatient Prescriptions  Medication Sig Dispense Refill  . aspirin 81 MG tablet Take 81 mg by mouth daily.    . Calcium Carbonate-Vitamin D (CALCIUM 600 + D PO) Take 1 tablet by mouth daily.    . carvedilol (COREG) 6.25 MG tablet Take 1 tablet (6.25 mg total) by mouth 2 (two) times daily with a meal. 60 tablet 3  . cetirizine (ZYRTEC) 10 MG tablet Take 10 mg by mouth daily.    . cholecalciferol (VITAMIN D-400) 400 UNITS TABS Take 400 Units by mouth daily.    Marland Kitchen Cod Liver Oil 1000 MG CAPS Take 1 capsule by mouth daily.    . cyclobenzaprine (FLEXERIL) 10 MG tablet Take 1 tablet (10 mg total) by mouth 2 (two) times daily as needed for muscle spasms. 20 tablet 0  . Fenofibrate 150 MG CAPS Take 1 capsule by mouth daily.    . Garlic 10 MG CAPS Take 1 tablet by mouth daily.    Marland Kitchen guaiFENesin (MUCINEX) 600 MG 12 hr tablet Take 600 mg by mouth 2 (two) times daily.    Marland Kitchen HYDROcodone-homatropine (HYCODAN) 5-1.5 MG/5ML syrup Take 5 mLs by mouth every 6 (six) hours as needed for cough. 120 mL 0  . ibuprofen (ADVIL,MOTRIN) 200 MG tablet Take 400 mg by mouth every 6 (six) hours as needed for mild pain.    Marland Kitchen omeprazole (PRILOSEC) 20 MG capsule Take 20 mg by mouth daily.    Marland Kitchen oxyCODONE-acetaminophen (ROXICET) 5-325 MG per tablet Take 1-2 tablets by mouth every 4 (four) hours as needed for pain. 60 tablet 0  . predniSONE (DELTASONE) 50 MG tablet Take 1 pill daily for 5 days. 5 tablet 0  . valsartan (DIOVAN) 160 MG tablet Take 160 mg by mouth at bedtime.     . vitamin C (ASCORBIC ACID) 500 MG  tablet Take 500 mg by mouth daily.     No current facility-administered medications for this visit.      Past Medical History:  Diagnosis Date  . Arthritis   . GERD (gastroesophageal reflux disease)   . Hyperlipidemia   . Hypertension   . Seasonal allergies     ROS:   All systems reviewed and negative except as noted in the HPI.   Past Surgical History:  Procedure Laterality Date  . ABDOMINAL HYSTERECTOMY  1995  . COLONOSCOPY  2013   [facility]  . FOOT SURGERY  1985   bilateral for flat feet with pain  . PACEMAKER IMPLANT N/A 10/29/2016   Procedure: Pacemaker Implant;  Surgeon: Evans Lance, MD;  Location: [facility];  Service: Cardiovascular;  Laterality: N/A;  . TONSILLECTOMY  as child  . TOTAL SHOULDER ARTHROPLASTY Right 12/30/2012   Procedure: RIGHT TOTAL SHOULDER ARTHROPLASTY;  Surgeon: Nita Sells, MD;  Location: [facility];  Service: Orthopedics;  Laterality: Right;  interscaline block  . WISDOM TOOTH EXTRACTION       Family History  Problem Relation Age of Onset  . Dementia Mother   . Colon cancer Maternal Aunt 70  .  Colon cancer Cousin 90       maternal  . Rectal cancer Neg Hx   . Stomach cancer Neg Hx      Social History   Social History  . Marital status: Married    Spouse name: N/A  . Number of children: N/A  . Years of education: N/A   Occupational History  . Not on file.   Social History Main Topics  . Smoking status: Current Every Day Smoker    Packs/day: 0.25    Types: Cigarettes  . Smokeless tobacco: Never Used  . Alcohol use No  . Drug use: No  . Sexual activity: No   Other Topics Concern  . Not on file   Social History Narrative  . No narrative on file     BP 120/64   Pulse 60   Ht 5\' 4"  (1.626 m)   Wt 133 lb 12.8 oz (60.7 kg)   BMI 22.97 kg/m   Physical Exam:  Well appearing 71 yo woman, NAD HEENT: Unremarkable Neck:  No JVD, no thyromegally Lymphatics:  No adenopathy Back:  No CVA tenderness Lungs:   Clear with no wheezes. Well healed PPM incision. HEART:  Regular rate rhythm, no murmurs, no rubs, no clicks Abd:  soft, positive bowel sounds, no organomegally, no rebound, no guarding Ext:  2 plus pulses, no edema, no cyanosis, no clubbing Skin:  No rashes no nodules Neuro:  CN II through XII intact, motor grossly intact  EKG - nsr with RBBB  DEVICE  Normal device function.  See PaceArt for details.   Assess/Plan: 1. Symptomatic 2:1 AV block - she is s/p PPM and is asymptomatic. 2. PPM - her [employer] DDD PM is working normally. Her His bundle lead is capturing nicely 3. HTN - her blood pressure is much improved.   Mikle Bosworth.D.

## 2017-01-27 LAB — CUP PACEART INCLINIC DEVICE CHECK
Battery Voltage: 2.98 V
Brady Statistic RA Percent Paced: 2.8 %
Brady Statistic RV Percent Paced: 99.56 %
Date Time Interrogation Session: 20180702153630
Implantable Lead Implant Date: 20180404
Implantable Lead Location: 753859
Lead Channel Pacing Threshold Amplitude: 0.75 V
Lead Channel Pacing Threshold Amplitude: 0.75 V
Lead Channel Pacing Threshold Pulse Width: 0.4 ms
Lead Channel Pacing Threshold Pulse Width: 1 ms
Lead Channel Setting Pacing Amplitude: 2 V
Lead Channel Setting Sensing Sensitivity: 2 mV
MDC IDC LEAD IMPLANT DT: 20180404
MDC IDC LEAD LOCATION: 753860
MDC IDC MSMT BATTERY REMAINING LONGEVITY: 91 mo
MDC IDC MSMT LEADCHNL RA PACING THRESHOLD AMPLITUDE: 0.75 V
MDC IDC MSMT LEADCHNL RA PACING THRESHOLD PULSEWIDTH: 0.4 ms
MDC IDC MSMT LEADCHNL RA SENSING INTR AMPL: 5 mV
MDC IDC MSMT LEADCHNL RV PACING THRESHOLD AMPLITUDE: 0.75 V
MDC IDC MSMT LEADCHNL RV PACING THRESHOLD PULSEWIDTH: 1 ms
MDC IDC MSMT LEADCHNL RV SENSING INTR AMPL: 12 mV
MDC IDC PG IMPLANT DT: 20180404
MDC IDC SET LEADCHNL RV PACING AMPLITUDE: 2.5 V
MDC IDC SET LEADCHNL RV PACING PULSEWIDTH: 1 ms
Pulse Gen Model: 2272
Pulse Gen Serial Number: 8002284

## 2017-02-05 ENCOUNTER — Ambulatory Visit: Payer: Medicare HMO

## 2017-02-28 ENCOUNTER — Other Ambulatory Visit: Payer: Self-pay | Admitting: Physician Assistant

## 2017-03-25 DIAGNOSIS — Z1231 Encounter for screening mammogram for malignant neoplasm of breast: Secondary | ICD-10-CM | POA: Diagnosis not present

## 2017-03-31 DIAGNOSIS — R921 Mammographic calcification found on diagnostic imaging of breast: Secondary | ICD-10-CM | POA: Diagnosis not present

## 2017-04-27 ENCOUNTER — Ambulatory Visit (INDEPENDENT_AMBULATORY_CARE_PROVIDER_SITE_OTHER): Payer: Medicare HMO | Admitting: *Deleted

## 2017-04-27 DIAGNOSIS — I441 Atrioventricular block, second degree: Secondary | ICD-10-CM | POA: Diagnosis not present

## 2017-04-28 NOTE — Progress Notes (Signed)
Remote pacemaker transmission.   

## 2017-04-29 ENCOUNTER — Encounter: Payer: Self-pay | Admitting: Cardiology

## 2017-04-29 DIAGNOSIS — Z1211 Encounter for screening for malignant neoplasm of colon: Secondary | ICD-10-CM | POA: Diagnosis not present

## 2017-04-29 DIAGNOSIS — L989 Disorder of the skin and subcutaneous tissue, unspecified: Secondary | ICD-10-CM | POA: Diagnosis not present

## 2017-04-29 DIAGNOSIS — Z23 Encounter for immunization: Secondary | ICD-10-CM | POA: Diagnosis not present

## 2017-04-29 DIAGNOSIS — F17200 Nicotine dependence, unspecified, uncomplicated: Secondary | ICD-10-CM | POA: Diagnosis not present

## 2017-04-29 DIAGNOSIS — I10 Essential (primary) hypertension: Secondary | ICD-10-CM | POA: Diagnosis not present

## 2017-04-29 DIAGNOSIS — E782 Mixed hyperlipidemia: Secondary | ICD-10-CM | POA: Diagnosis not present

## 2017-04-29 DIAGNOSIS — M859 Disorder of bone density and structure, unspecified: Secondary | ICD-10-CM | POA: Diagnosis not present

## 2017-04-30 ENCOUNTER — Telehealth: Payer: Self-pay | Admitting: Internal Medicine

## 2017-04-30 LAB — CUP PACEART REMOTE DEVICE CHECK
Battery Remaining Longevity: 116 mo
Battery Voltage: 3.01 V
Brady Statistic AP VP Percent: 1 %
Brady Statistic AS VP Percent: 1 %
Date Time Interrogation Session: 20181001060012
Implantable Lead Implant Date: 20180404
Implantable Lead Location: 753859
Implantable Pulse Generator Implant Date: 20180404
Lead Channel Impedance Value: 580 Ohm
Lead Channel Pacing Threshold Amplitude: 0.75 V
MDC IDC LEAD IMPLANT DT: 20180404
MDC IDC LEAD LOCATION: 753860
MDC IDC MSMT BATTERY REMAINING PERCENTAGE: 95.5 %
MDC IDC MSMT LEADCHNL RA IMPEDANCE VALUE: 600 Ohm
MDC IDC MSMT LEADCHNL RA PACING THRESHOLD PULSEWIDTH: 0.4 ms
MDC IDC MSMT LEADCHNL RA SENSING INTR AMPL: 5 mV
MDC IDC MSMT LEADCHNL RV PACING THRESHOLD AMPLITUDE: 0.75 V
MDC IDC MSMT LEADCHNL RV PACING THRESHOLD PULSEWIDTH: 1 ms
MDC IDC MSMT LEADCHNL RV SENSING INTR AMPL: 12 mV
MDC IDC SET LEADCHNL RA PACING AMPLITUDE: 2 V
MDC IDC SET LEADCHNL RV PACING AMPLITUDE: 2.5 V
MDC IDC SET LEADCHNL RV PACING PULSEWIDTH: 1 ms
MDC IDC SET LEADCHNL RV SENSING SENSITIVITY: 2 mV
MDC IDC STAT BRADY AP VS PERCENT: 4.1 %
MDC IDC STAT BRADY AS VS PERCENT: 95 %
MDC IDC STAT BRADY RA PERCENT PACED: 4 %
MDC IDC STAT BRADY RV PERCENT PACED: 1 %
Pulse Gen Model: 2272
Pulse Gen Serial Number: 8002284

## 2017-04-30 NOTE — Telephone Encounter (Signed)
Left message for patient to return my call.

## 2017-04-30 NOTE — Telephone Encounter (Signed)
Ok

## 2017-07-03 NOTE — Telephone Encounter (Signed)
Colon had been scheduled with Dr. Havery Moros. I called patient to see if patient did want to see Dr. Havery Moros or switch over to Dr. Hilarie Fredrickson. Patient is requesting to stay scheduled with Dr. Havery Moros.

## 2017-07-10 ENCOUNTER — Other Ambulatory Visit: Payer: Self-pay

## 2017-07-10 ENCOUNTER — Ambulatory Visit (AMBULATORY_SURGERY_CENTER): Payer: Self-pay | Admitting: *Deleted

## 2017-07-10 VITALS — Ht 64.0 in | Wt 140.0 lb

## 2017-07-10 DIAGNOSIS — Z86010 Personal history of colonic polyps: Secondary | ICD-10-CM

## 2017-07-10 NOTE — Progress Notes (Signed)
No egg or soy allergy known to patient  No issues with past sedation with any surgeries  or procedures, no intubation problems  No diet pills per patient No home 02 use per patient  No blood thinners per patient  Pt denies issues with constipation  Rarely  On stool softeners.   No A fib or A flutter  EMMI video sent to pt's e mail pt. declined

## 2017-07-24 ENCOUNTER — Encounter: Payer: Self-pay | Admitting: Gastroenterology

## 2017-07-24 ENCOUNTER — Ambulatory Visit (AMBULATORY_SURGERY_CENTER): Payer: Medicare HMO | Admitting: Gastroenterology

## 2017-07-24 ENCOUNTER — Other Ambulatory Visit: Payer: Self-pay

## 2017-07-24 VITALS — BP 112/60 | HR 60 | Temp 96.6°F | Resp 10 | Ht 64.0 in | Wt 140.0 lb

## 2017-07-24 DIAGNOSIS — I10 Essential (primary) hypertension: Secondary | ICD-10-CM | POA: Diagnosis not present

## 2017-07-24 DIAGNOSIS — Z1211 Encounter for screening for malignant neoplasm of colon: Secondary | ICD-10-CM | POA: Diagnosis not present

## 2017-07-24 DIAGNOSIS — Z86010 Personal history of colonic polyps: Secondary | ICD-10-CM | POA: Diagnosis present

## 2017-07-24 DIAGNOSIS — D122 Benign neoplasm of ascending colon: Secondary | ICD-10-CM

## 2017-07-24 DIAGNOSIS — D124 Benign neoplasm of descending colon: Secondary | ICD-10-CM

## 2017-07-24 DIAGNOSIS — K219 Gastro-esophageal reflux disease without esophagitis: Secondary | ICD-10-CM | POA: Diagnosis not present

## 2017-07-24 MED ORDER — SODIUM CHLORIDE 0.9 % IV SOLN: 500.0000 mL | Freq: Once | INTRAVENOUS | Status: DC

## 2017-07-24 NOTE — Op Note (Signed)
[facility] Patient Name: Denise Hodges Procedure Date: 07/24/2017 9:11 AM MRN: [id number] Endoscopist: Remo Lipps P. Armbruster MD, MD Age: 71 Referring MD:  Date of Birth: 1946/04/03 Gender: Female Account #: [id number] Procedure:                Colonoscopy Indications:              Surveillance: Personal history of adenomatous                            polyps on last colonoscopy 5 years ago Medicines:                Monitored Anesthesia Care Procedure:                Pre-Anesthesia Assessment:                           - Prior to the procedure, a History and Physical                            was performed, and patient medications and                            allergies were reviewed. The patient's tolerance of                            previous anesthesia was also reviewed. The risks                            and benefits of the procedure and the sedation                            options and risks were discussed with the patient.                            All questions were answered, and informed consent                            was obtained. Prior Anticoagulants: The patient has                            taken no previous anticoagulant or antiplatelet                            agents. ASA Grade Assessment: II - A patient with                            mild systemic disease. After reviewing the risks                            and benefits, the patient was deemed in                            satisfactory condition to undergo the procedure.  After obtaining informed consent, the colonoscope                            was passed under direct vision. Throughout the                            procedure, the patient's blood pressure, pulse, and                            oxygen saturations were monitored continuously. The                            Colonoscope was introduced through the anus and                            advanced to the the  cecum, identified by                            appendiceal orifice and ileocecal valve. The                            colonoscopy was technically difficult and complex                            due to a tortuous colon. The patient tolerated the                            procedure well. The quality of the bowel                            preparation was good. The ileocecal valve,                            appendiceal orifice, and rectum were photographed. Scope In: 9:15:55 AM Scope Out: 9:40:34 AM Scope Withdrawal Time: 0 hours 14 minutes 57 seconds  Total Procedure Duration: 0 hours 24 minutes 39 seconds  Findings:                 The perianal and digital rectal examinations were                            normal.                           There was a medium-sized lipoma, in the ascending                            colon.                           A 5 mm polyp was found in the ascending colon. The                            polyp was sessile. The polyp was removed with a  cold snare. Resection and retrieval were complete.                           A 4 mm polyp was found in the descending colon. The                            polyp was sessile. The polyp was removed with a                            cold snare. Resection and retrieval were complete.                           Mild melanosis was found in the right and                            transverse colon.                           The colon was tortuous which prolonged the                            procedure.                           Internal hemorrhoids were found during retroflexion.                           The exam was otherwise without abnormality. Complications:            No immediate complications. Estimated blood loss:                            Minimal. Estimated Blood Loss:     Estimated blood loss was minimal. Impression:               - Medium-sized lipoma in the ascending colon.                            - One 5 mm polyp in the ascending colon, removed                            with a cold snare. Resected and retrieved.                           - One 4 mm polyp in the descending colon, removed                            with a cold snare. Resected and retrieved.                           - Melanosis in the right and transverse colon.                           - Tortuous colon.                           -  Internal hemorrhoids.                           - The examination was otherwise normal. Recommendation:           - Patient has a contact number available for                            emergencies. The signs and symptoms of potential                            delayed complications were discussed with the                            patient. Return to normal activities tomorrow.                            Written discharge instructions were provided to the                            patient.                           - Resume previous diet.                           - Continue present medications.                           - Await pathology results.                           - Repeat colonoscopy is recommended for                            surveillance. The colonoscopy date will be                            determined after pathology results from today's                            exam become available for review. Remo Lipps P. Armbruster MD, MD 07/24/2017 9:45:21 AM This report has been signed electronically.

## 2017-07-24 NOTE — Patient Instructions (Signed)
**  Handout given to patient on polyps** Also has a lipoma which doesn't cause problems You do have a "twisty" colon 2 small polyps today removed  YOU HAD AN ENDOSCOPIC PROCEDURE TODAY AT [facility]:   Refer to the procedure report that was given to you for any specific questions about what was found during the examination.  If the procedure report does not answer your questions, please call your gastroenterologist to clarify.  If you requested that your care partner not be given the details of your procedure findings, then the procedure report has been included in a sealed envelope for you to review at your convenience later.  YOU SHOULD EXPECT: Some feelings of bloating in the abdomen. Passage of more gas than usual.  Walking can help get rid of the air that was put into your GI tract during the procedure and reduce the bloating. If you had a lower endoscopy (such as a colonoscopy or flexible sigmoidoscopy) you may notice spotting of blood in your stool or on the toilet paper. If you underwent a bowel prep for your procedure, you may not have a normal bowel movement for a few days.  Please Note:  You might notice some irritation and congestion in your nose or some drainage.  This is from the oxygen used during your procedure.  There is no need for concern and it should clear up in a day or so.  SYMPTOMS TO REPORT IMMEDIATELY:   Following lower endoscopy (colonoscopy or flexible sigmoidoscopy):  Excessive amounts of blood in the stool  Significant tenderness or worsening of abdominal pains  Swelling of the abdomen that is new, acute  Fever of 100F or higher   For urgent or emergent issues, a gastroenterologist can be reached at any hour by calling [phone].   DIET:  We do recommend a small meal at first, but then you may proceed to your regular diet.  Drink plenty of fluids but you should avoid alcoholic beverages for 24 hours.  ACTIVITY:  You should plan to take it  easy for the rest of today and you should NOT DRIVE or use heavy machinery until tomorrow (because of the sedation medicines used during the test).    FOLLOW UP: Our staff will call the number listed on your records the next business day following your procedure to check on you and address any questions or concerns that you may have regarding the information given to you following your procedure. If we do not reach you, we will leave a message.  However, if you are feeling well and you are not experiencing any problems, there is no need to return our call.  We will assume that you have returned to your regular daily activities without incident.  If any biopsies were taken you will be contacted by phone or by letter within the next 1-3 weeks.  Please call us at [phone] if you have not heard about the biopsies in 3 weeks.    SIGNATURES/CONFIDENTIALITY: You and/or your care partner have signed paperwork which will be entered into your electronic medical record.  These signatures attest to the fact that that the information above on your After Visit Summary has been reviewed and is understood.  Full responsibility of the confidentiality of this discharge information lies with you and/or your care-partner.

## 2017-07-24 NOTE — Progress Notes (Signed)
Report to PACU, RN, vss, BBS= Clear.  

## 2017-07-24 NOTE — Progress Notes (Signed)
Called to room to assist during endoscopic procedure.  Patient ID and intended procedure confirmed with present staff. Received instructions for my participation in the procedure from the performing physician.  

## 2017-07-24 NOTE — Progress Notes (Signed)
Pt's states no medical or surgical changes since previsit or office visit. 

## 2017-07-27 ENCOUNTER — Telehealth: Payer: Self-pay

## 2017-07-27 ENCOUNTER — Ambulatory Visit (INDEPENDENT_AMBULATORY_CARE_PROVIDER_SITE_OTHER): Payer: Medicare HMO | Admitting: *Deleted

## 2017-07-27 DIAGNOSIS — I441 Atrioventricular block, second degree: Secondary | ICD-10-CM

## 2017-07-27 NOTE — Telephone Encounter (Signed)
  Follow up Call-  Call back number 07/24/2017  Post procedure Call Back phone  # [phone]  Permission to leave phone message Yes  Some recent data might be hidden     Patient questions:  Do you have a fever, pain , or abdominal swelling? No. Pain Score  0 *  Have you tolerated food without any problems? Yes.    Have you been able to return to your normal activities? Yes.    Do you have any questions about your discharge instructions: Diet   No. Medications  No. Follow up visit  No.  Do you have questions or concerns about your Care? No.  Actions: * If pain score is 4 or above: No action needed, pain <4.

## 2017-07-29 NOTE — Progress Notes (Signed)
Remote pacemaker transmission.   

## 2017-07-30 ENCOUNTER — Encounter: Payer: Self-pay | Admitting: Gastroenterology

## 2017-07-30 LAB — CUP PACEART REMOTE DEVICE CHECK
Battery Remaining Longevity: 114 mo
Battery Remaining Percentage: 95.5 %
Brady Statistic AP VS Percent: 3.4 %
Brady Statistic RA Percent Paced: 3.3 %
Date Time Interrogation Session: 20181231070012
Implantable Lead Implant Date: 20180404
Implantable Lead Location: 753859
Implantable Lead Model: 3830
Lead Channel Impedance Value: 560 Ohm
Lead Channel Pacing Threshold Amplitude: 0.75 V
Lead Channel Pacing Threshold Pulse Width: 0.4 ms
Lead Channel Sensing Intrinsic Amplitude: 5 mV
MDC IDC LEAD IMPLANT DT: 20180404
MDC IDC LEAD LOCATION: 753860
MDC IDC MSMT BATTERY VOLTAGE: 3.01 V
MDC IDC MSMT LEADCHNL RA PACING THRESHOLD AMPLITUDE: 0.75 V
MDC IDC MSMT LEADCHNL RV IMPEDANCE VALUE: 560 Ohm
MDC IDC MSMT LEADCHNL RV PACING THRESHOLD PULSEWIDTH: 1 ms
MDC IDC MSMT LEADCHNL RV SENSING INTR AMPL: 12 mV
MDC IDC PG IMPLANT DT: 20180404
MDC IDC SET LEADCHNL RA PACING AMPLITUDE: 2 V
MDC IDC SET LEADCHNL RV PACING AMPLITUDE: 2.5 V
MDC IDC SET LEADCHNL RV PACING PULSEWIDTH: 1 ms
MDC IDC SET LEADCHNL RV SENSING SENSITIVITY: 2 mV
MDC IDC STAT BRADY AP VP PERCENT: 1 %
MDC IDC STAT BRADY AS VP PERCENT: 1 %
MDC IDC STAT BRADY AS VS PERCENT: 96 %
MDC IDC STAT BRADY RV PERCENT PACED: 1 %
Pulse Gen Model: 2272
Pulse Gen Serial Number: 8002284

## 2017-07-31 ENCOUNTER — Encounter: Payer: Self-pay | Admitting: Cardiology

## 2017-09-08 ENCOUNTER — Other Ambulatory Visit: Payer: Self-pay | Admitting: Internal Medicine

## 2017-09-08 MED ORDER — CARVEDILOL 6.25 MG PO TABS: 6.2500 mg | ORAL_TABLET | Freq: Two times a day (BID) | ORAL | 1 refills | Status: DC

## 2017-09-29 ENCOUNTER — Ambulatory Visit: Payer: Medicare HMO | Admitting: Internal Medicine

## 2017-09-29 DIAGNOSIS — D649 Anemia, unspecified: Secondary | ICD-10-CM | POA: Diagnosis not present

## 2017-09-29 DIAGNOSIS — Z1389 Encounter for screening for other disorder: Secondary | ICD-10-CM | POA: Diagnosis not present

## 2017-09-29 DIAGNOSIS — M85859 Other specified disorders of bone density and structure, unspecified thigh: Secondary | ICD-10-CM | POA: Diagnosis not present

## 2017-09-29 DIAGNOSIS — I10 Essential (primary) hypertension: Secondary | ICD-10-CM | POA: Diagnosis not present

## 2017-09-29 DIAGNOSIS — Z950 Presence of cardiac pacemaker: Secondary | ICD-10-CM | POA: Diagnosis not present

## 2017-09-29 DIAGNOSIS — Z0000 Encounter for general adult medical examination without abnormal findings: Secondary | ICD-10-CM | POA: Diagnosis not present

## 2017-09-29 DIAGNOSIS — E782 Mixed hyperlipidemia: Secondary | ICD-10-CM | POA: Diagnosis not present

## 2017-10-01 DIAGNOSIS — R921 Mammographic calcification found on diagnostic imaging of breast: Secondary | ICD-10-CM | POA: Diagnosis not present

## 2017-10-06 ENCOUNTER — Ambulatory Visit (HOSPITAL_COMMUNITY)
Admission: EM | Admit: 2017-10-06 | Discharge: 2017-10-06 | Disposition: A | Discharge status: Home / Self Care | Payer: Medicare HMO | Attending: Family Medicine | Admitting: Family Medicine

## 2017-10-06 ENCOUNTER — Other Ambulatory Visit: Payer: Self-pay

## 2017-10-06 ENCOUNTER — Encounter (HOSPITAL_COMMUNITY): Payer: Self-pay | Admitting: Emergency Medicine

## 2017-10-06 DIAGNOSIS — J0100 Acute maxillary sinusitis, unspecified: Secondary | ICD-10-CM

## 2017-10-06 DIAGNOSIS — H9201 Otalgia, right ear: Secondary | ICD-10-CM

## 2017-10-06 MED ORDER — AZITHROMYCIN 250 MG PO TABS: 250.0000 mg | ORAL_TABLET | Freq: Every day | ORAL | 0 refills | Status: DC

## 2017-10-06 NOTE — ED Provider Notes (Signed)
[facility]   [id number] 10/06/17 Arrival Time: 1000  ASSESSMENT & PLAN:  1. Acute non-recurrent maxillary sinusitis   2. Acute otalgia, right     Meds ordered this encounter  Medications  . azithromycin (ZITHROMAX) 250 MG tablet    Sig: Take 1 tablet (250 mg total) by mouth daily. Take first 2 tablets together, then 1 every day until finished.    Dispense:  6 tablet    Refill:  0   Discussed typical duration of symptoms. OTC symptom care as needed. May f/u with PCP or here as needed.  Reviewed expectations re: course of current medical issues. Questions answered. Outlined signs and symptoms indicating need for more acute intervention. Patient verbalized understanding. After Visit Summary given.   SUBJECTIVE: History from: patient.  Denise Hodges is a 72 y.o. female who presents with complaint of nasal congestion, post-nasal drainage, and a persistent sinus pressure and right ear pressure without drainage. Onset gradual, lasting over the past week; questions if she has been congested longer. Mild dry cough. SOB: none. Wheezing: none. Fever: no. Overall normal PO intake without n/v. Sick contacts: no. OTC treatment: none reported.  Social History   Tobacco Use  Smoking Status Current Every Day Smoker  . Packs/day: 0.25  . Types: Cigarettes  Smokeless Tobacco Never Used    ROS: As per HPI.   OBJECTIVE:  Vitals:   10/06/17 1013  BP: 139/79  Pulse: 74  Resp: 16  Temp: 98.1 F (36.7 C)  TempSrc: Oral  SpO2: 100%     General appearance: alert; appears fatigued HEENT: nasal congestion; clear runny nose; throat irritation secondary to post-nasal drainage; bilateral maxillary sinus tenderness to palpation; turbinates boggy Neck: supple without LAD Lungs: unlabored respirations, symmetrical air entry; cough: mild; no respiratory distress Skin: warm and dry Psychological: alert and cooperative; normal mood and affect    Allergies  Allergen  Reactions  . Macrobid [Nitrofurantoin Macrocrystal] Hives and Shortness Of Breath    Past Medical History:  Diagnosis Date  . Allergy   . Arthritis   . Bradycardia    pacemaker placed in April 2018  [employer]  . GERD (gastroesophageal reflux disease)   . Hyperlipidemia   . Hypertension   . Seasonal allergies    Family History  Problem Relation Age of Onset  . Dementia Mother   . Colon cancer Maternal Aunt 5  . Colon cancer Cousin 17       maternal  . Rectal cancer Neg Hx   . Stomach cancer Neg Hx   . Colon polyps Neg Hx   . Esophageal cancer Neg Hx    Social History   Socioeconomic History  . Marital status: Married    Spouse name: Not on file  . Number of children: Not on file  . Years of education: Not on file  . Highest education level: Not on file  Social Needs  . Financial resource strain: Not on file  . Food insecurity - worry: Not on file  . Food insecurity - inability: Not on file  . Transportation needs - medical: Not on file  . Transportation needs - non-medical: Not on file  Occupational History  . Not on file  Tobacco Use  . Smoking status: Current Every Day Smoker    Packs/day: 0.25    Types: Cigarettes  . Smokeless tobacco: Never Used  Substance and Sexual Activity  . Alcohol use: No  . Drug use: No  . Sexual activity: No  Other Topics  Concern  . Not on file  Social History Narrative  . Not on file           Vanessa Kick, MD 10/06/17 1132

## 2017-10-06 NOTE — ED Triage Notes (Signed)
Sinus drainage and right ear pain.

## 2017-10-16 ENCOUNTER — Encounter: Payer: Self-pay | Admitting: Internal Medicine

## 2017-10-26 ENCOUNTER — Ambulatory Visit (INDEPENDENT_AMBULATORY_CARE_PROVIDER_SITE_OTHER): Payer: Medicare HMO | Admitting: *Deleted

## 2017-10-26 DIAGNOSIS — I441 Atrioventricular block, second degree: Secondary | ICD-10-CM | POA: Diagnosis not present

## 2017-10-26 NOTE — Progress Notes (Signed)
Remote pacemaker transmission.   

## 2017-10-27 ENCOUNTER — Encounter: Payer: Self-pay | Admitting: Cardiology

## 2017-10-27 ENCOUNTER — Encounter: Payer: Self-pay | Admitting: Internal Medicine

## 2017-10-27 ENCOUNTER — Ambulatory Visit (INDEPENDENT_AMBULATORY_CARE_PROVIDER_SITE_OTHER): Payer: Medicare HMO | Admitting: Internal Medicine

## 2017-10-27 VITALS — BP 122/62 | HR 74 | Ht 64.0 in | Wt 141.0 lb

## 2017-10-27 DIAGNOSIS — Z950 Presence of cardiac pacemaker: Secondary | ICD-10-CM

## 2017-10-27 DIAGNOSIS — I441 Atrioventricular block, second degree: Secondary | ICD-10-CM | POA: Diagnosis not present

## 2017-10-27 DIAGNOSIS — I10 Essential (primary) hypertension: Secondary | ICD-10-CM

## 2017-10-27 LAB — CUP PACEART INCLINIC DEVICE CHECK
Battery Remaining Longevity: 142 mo
Battery Voltage: 3.01 V
Brady Statistic RA Percent Paced: 2.8 %
Brady Statistic RV Percent Paced: 0.53 %
Implantable Lead Implant Date: 20180404
Implantable Lead Implant Date: 20180404
Implantable Lead Location: 753859
Implantable Pulse Generator Implant Date: 20180404
Lead Channel Impedance Value: 512.5 Ohm
Lead Channel Impedance Value: 625 Ohm
Lead Channel Pacing Threshold Amplitude: 0.5 V
Lead Channel Pacing Threshold Pulse Width: 0.4 ms
Lead Channel Pacing Threshold Pulse Width: 0.4 ms
Lead Channel Sensing Intrinsic Amplitude: 12 mV
Lead Channel Setting Pacing Amplitude: 2 V
Lead Channel Setting Sensing Sensitivity: 2 mV
MDC IDC LEAD LOCATION: 753860
MDC IDC MSMT LEADCHNL RA PACING THRESHOLD AMPLITUDE: 0.5 V
MDC IDC MSMT LEADCHNL RA SENSING INTR AMPL: 4.8 mV
MDC IDC MSMT LEADCHNL RV PACING THRESHOLD AMPLITUDE: 0.75 V
MDC IDC MSMT LEADCHNL RV PACING THRESHOLD AMPLITUDE: 0.75 V
MDC IDC MSMT LEADCHNL RV PACING THRESHOLD PULSEWIDTH: 1 ms
MDC IDC MSMT LEADCHNL RV PACING THRESHOLD PULSEWIDTH: 1 ms
MDC IDC SESS DTM: 20190402134620
MDC IDC SET LEADCHNL RV PACING AMPLITUDE: 2.5 V
MDC IDC SET LEADCHNL RV PACING PULSEWIDTH: 1 ms
Pulse Gen Model: 2272
Pulse Gen Serial Number: 8002284

## 2017-10-27 NOTE — Patient Instructions (Signed)

## 2017-10-27 NOTE — Progress Notes (Addendum)
HPI Denise Hodges returns today for followup. She is a pleasant 72 yo woman with RBBB who was found to have symptomatic 2:1 AV block and underwent PPM insertion 12 months ago. She has done well in the interim with no chest pain, sob, or syncope. He blood pressure is under better control. No edema. She is still smoking but states that mostly she lets her cigarettes burn up.  Allergies  Allergen Reactions  . Macrobid [Nitrofurantoin Macrocrystal] Hives and Shortness Of Breath     Current Outpatient Medications  Medication Sig Dispense Refill  . aspirin 81 MG tablet Take 81 mg by mouth daily.    Marland Kitchen azithromycin (ZITHROMAX) 250 MG tablet Take 1 tablet (250 mg total) by mouth daily. Take first 2 tablets together, then 1 every day until finished. 6 tablet 0  . Calcium Carbonate-Vitamin D (CALCIUM 600 + D PO) Take 1 tablet by mouth daily.    . carvedilol (COREG) 6.25 MG tablet Take 1 tablet (6.25 mg total) by mouth 2 (two) times daily with a meal. Please keep upcoming appt in April. Thank you 180 tablet 1  . cetirizine (ZYRTEC) 10 MG tablet Take 10 mg by mouth daily.    . cholecalciferol (VITAMIN D-400) 400 UNITS TABS Take 400 Units by mouth daily.    Marland Kitchen Cod Liver Oil 1000 MG CAPS Take 1 capsule by mouth daily.    . Fenofibrate 150 MG CAPS Take 1 capsule by mouth daily.    . Garlic 10 MG CAPS Take 1 tablet by mouth daily.    Marland Kitchen guaiFENesin (MUCINEX) 600 MG 12 hr tablet Take 600 mg by mouth 2 (two) times daily.    Marland Kitchen ibuprofen (ADVIL,MOTRIN) 200 MG tablet Take 400 mg by mouth every 6 (six) hours as needed for mild pain.    Marland Kitchen irbesartan (AVAPRO) 150 MG tablet Take 150 mg by mouth daily.  5  . naproxen sodium (ALEVE) 220 MG tablet Take 220 mg by mouth.    Marland Kitchen omeprazole (PRILOSEC) 20 MG capsule Take 20 mg by mouth daily.    . vitamin C (ASCORBIC ACID) 500 MG tablet Take 500 mg by mouth daily.     No current facility-administered medications for this visit.      Past Medical History:    Diagnosis Date  . Allergy   . Arthritis   . Bradycardia    pacemaker placed in April 2018  Medtronic  . GERD (gastroesophageal reflux disease)   . Hyperlipidemia   . Hypertension   . Seasonal allergies     ROS:   All systems reviewed and negative except as noted in the HPI.   Past Surgical History:  Procedure Laterality Date  . ABDOMINAL HYSTERECTOMY  1995  . COLONOSCOPY  2013   brodie  . FOOT SURGERY  1985   bilateral for flat feet with pain  . PACEMAKER IMPLANT N/A 10/29/2016   Procedure: Pacemaker Implant;  Surgeon: Evans Lance, MD;  Location: [facility];  Service: Cardiovascular;  Laterality: N/A;  . POLYPECTOMY    . TONSILLECTOMY  as child  . TOTAL SHOULDER ARTHROPLASTY Right 12/30/2012   Procedure: RIGHT TOTAL SHOULDER ARTHROPLASTY;  Surgeon: Nita Sells, MD;  Location: [facility];  Service: Orthopedics;  Laterality: Right;  interscaline block  . WISDOM TOOTH EXTRACTION       Family History  Problem Relation Age of Onset  . Dementia Mother   . Colon cancer Maternal Aunt 39  . Colon cancer  Cousin 80       maternal  . Rectal cancer Neg Hx   . Stomach cancer Neg Hx   . Colon polyps Neg Hx   . Esophageal cancer Neg Hx      Social History   Socioeconomic History  . Marital status: Married    Spouse name: Not on file  . Number of children: Not on file  . Years of education: Not on file  . Highest education level: Not on file  Occupational History  . Not on file  Social Needs  . Financial resource strain: Not on file  . Food insecurity:    Worry: Not on file    Inability: Not on file  . Transportation needs:    Medical: Not on file    Non-medical: Not on file  Tobacco Use  . Smoking status: Current Every Day Smoker    Packs/day: 0.25    Types: Cigarettes  . Smokeless tobacco: Never Used  Substance and Sexual Activity  . Alcohol use: No  . Drug use: No  . Sexual activity: Never  Lifestyle  . Physical activity:    Days per week:  Not on file    Minutes per session: Not on file  . Stress: Not on file  Relationships  . Social connections:    Talks on phone: Not on file    Gets together: Not on file    Attends religious service: Not on file    Active member of club or organization: Not on file    Attends meetings of clubs or organizations: Not on file    Relationship status: Not on file  . Intimate partner violence:    Fear of current or ex partner: Not on file    Emotionally abused: Not on file    Physically abused: Not on file    Forced sexual activity: Not on file  Other Topics Concern  . Not on file  Social History Narrative  . Not on file     BP 122/62   Pulse 74   Ht 5\' 4"  (1.626 m)   Wt 141 lb (64 kg)   BMI 24.20 kg/m   Physical Exam:  Well appearing NAD HEENT: Unremarkable Neck:  No JVD, no thyromegally Lymphatics:  No adenopathy Back:  No CVA tenderness Lungs:  Clear HEART:  Regular rate rhythm, no murmurs, no rubs, no clicks Abd:  soft, positive bowel sounds, no organomegally, no rebound, no guarding Ext:  2 plus pulses, no edema, no cyanosis, no clubbing Skin:  No rashes no nodules Neuro:  CN II through XII intact, motor grossly intact  EKG - NSR with RBBB  DEVICE  Normal device function.  See PaceArt for details.   Assess/Plan: 1. Symptomatic 2: 1 AV block - she is asymptomatic s/p PPM insertion. 2. PPM - her medtronic DDD PM is working normally. Will recheck in several months. 3. HTN - her blood pressure is well controlled. Will follow. She is encouraged to maintain a low sodium diet. 4. Tobacco abuse - I have strongly encouraged the patient to stop smoking.   Mikle Bosworth.D.

## 2017-11-10 LAB — CUP PACEART REMOTE DEVICE CHECK
Battery Remaining Longevity: 121 mo
Battery Voltage: 3.01 V
Brady Statistic AS VP Percent: 1 %
Brady Statistic RA Percent Paced: 2.9 %
Date Time Interrogation Session: 20190401060012
Implantable Lead Location: 753859
Implantable Lead Model: 3830
Implantable Pulse Generator Implant Date: 20180404
Lead Channel Impedance Value: 460 Ohm
Lead Channel Impedance Value: 600 Ohm
Lead Channel Pacing Threshold Amplitude: 0.75 V
Lead Channel Pacing Threshold Pulse Width: 0.4 ms
Lead Channel Sensing Intrinsic Amplitude: 12 mV
Lead Channel Setting Pacing Amplitude: 2 V
MDC IDC LEAD IMPLANT DT: 20180404
MDC IDC LEAD IMPLANT DT: 20180404
MDC IDC LEAD LOCATION: 753860
MDC IDC MSMT BATTERY REMAINING PERCENTAGE: 95.5 %
MDC IDC MSMT LEADCHNL RA SENSING INTR AMPL: 4.7 mV
MDC IDC MSMT LEADCHNL RV PACING THRESHOLD AMPLITUDE: 0.75 V
MDC IDC MSMT LEADCHNL RV PACING THRESHOLD PULSEWIDTH: 1 ms
MDC IDC SET LEADCHNL RV PACING AMPLITUDE: 2.5 V
MDC IDC SET LEADCHNL RV PACING PULSEWIDTH: 1 ms
MDC IDC SET LEADCHNL RV SENSING SENSITIVITY: 2 mV
MDC IDC STAT BRADY AP VP PERCENT: 1 %
MDC IDC STAT BRADY AP VS PERCENT: 3 %
MDC IDC STAT BRADY AS VS PERCENT: 97 %
MDC IDC STAT BRADY RV PERCENT PACED: 1 %
Pulse Gen Model: 2272
Pulse Gen Serial Number: 8002284

## 2017-12-03 DIAGNOSIS — M8588 Other specified disorders of bone density and structure, other site: Secondary | ICD-10-CM | POA: Diagnosis not present

## 2018-01-22 ENCOUNTER — Encounter (HOSPITAL_COMMUNITY): Payer: Self-pay | Admitting: Emergency Medicine

## 2018-01-22 ENCOUNTER — Ambulatory Visit (HOSPITAL_COMMUNITY)
Admission: EM | Admit: 2018-01-22 | Discharge: 2018-01-22 | Disposition: A | Discharge status: Home / Self Care | Payer: Medicare HMO | Attending: Family Medicine | Admitting: Family Medicine

## 2018-01-22 DIAGNOSIS — R195 Other fecal abnormalities: Secondary | ICD-10-CM

## 2018-01-22 DIAGNOSIS — K5641 Fecal impaction: Secondary | ICD-10-CM

## 2018-01-22 LAB — OCCULT BLOOD, POC DEVICE: FECAL OCCULT BLD: NEGATIVE

## 2018-01-22 MED ORDER — POLYETHYLENE GLYCOL 3350 17 G PO PACK: 17.0000 g | PACK | Freq: Every day | ORAL | 0 refills | Status: DC

## 2018-01-22 MED ORDER — ENEMA MINERAL OIL RE ENEM: 1.0000 "application " | ENEMA | Freq: Every day | RECTAL | 0 refills | Status: DC

## 2018-01-22 NOTE — ED Provider Notes (Addendum)
[place]   [id number] 01/22/18 Arrival Time: 0956  SUBJECTIVE:  Denise Hodges is a 72 y.o. female who presents with complaint of hard stool in her rectum that began one week ago.  States her symptoms began after eating large amounts of cheese.  Has tried doculax and senna without relief.  Denies aggravating factors.  Reports improvement with bowel movement.  Denies similar symptoms in the past.  Last BM yesterday normal for patient.  Complains of associated bloating.  Denies fever, chills, appetite changes, weight changes, nausea, vomiting, chest pain, SOB, diarrhea, hematochezia, melena, dysuria, difficulty urinating, increased frequency or urgency, flank pain, loss of bowel or bladder function.  Patient also has hx of hemorrhoids.    Last colonoscopy last year was positive with two polyps.  Next colonoscopy scheduled in five years.    No LMP recorded. Patient has had a hysterectomy.  ROS: As per HPI.  Past Medical History:  Diagnosis Date  . Allergy   . Arthritis   . Bradycardia    pacemaker placed in April 2018  Medtronic  . GERD (gastroesophageal reflux disease)   . Hyperlipidemia   . Hypertension   . Seasonal allergies    Past Surgical History:  Procedure Laterality Date  . ABDOMINAL HYSTERECTOMY  1995  . COLONOSCOPY  2013   brodie  . FOOT SURGERY  1985   bilateral for flat feet with pain  . PACEMAKER IMPLANT N/A 10/29/2016   Procedure: Pacemaker Implant;  Surgeon: Evans Lance, MD;  Location: [facility];  Service: Cardiovascular;  Laterality: N/A;  . POLYPECTOMY    . TONSILLECTOMY  as child  . TOTAL SHOULDER ARTHROPLASTY Right 12/30/2012   Procedure: RIGHT TOTAL SHOULDER ARTHROPLASTY;  Surgeon: Nita Sells, MD;  Location: [facility];  Service: Orthopedics;  Laterality: Right;  interscaline block  . WISDOM TOOTH EXTRACTION     Allergies  Allergen Reactions  . Macrobid [Nitrofurantoin Macrocrystal] Hives and Shortness Of Breath   No  current facility-administered medications on file prior to encounter.    Current Outpatient Medications on File Prior to Encounter  Medication Sig Dispense Refill  . aspirin 81 MG tablet Take 81 mg by mouth daily.    Marland Kitchen azithromycin (ZITHROMAX) 250 MG tablet Take 1 tablet (250 mg total) by mouth daily. Take first 2 tablets together, then 1 every day until finished. 6 tablet 0  . Calcium Carbonate-Vitamin D (CALCIUM 600 + D PO) Take 1 tablet by mouth daily.    . carvedilol (COREG) 6.25 MG tablet Take 1 tablet (6.25 mg total) by mouth 2 (two) times daily with a meal. Please keep upcoming appt in April. Thank you 180 tablet 1  . cetirizine (ZYRTEC) 10 MG tablet Take 10 mg by mouth daily.    . cholecalciferol (VITAMIN D-400) 400 UNITS TABS Take 400 Units by mouth daily.    Marland Kitchen Cod Liver Oil 1000 MG CAPS Take 1 capsule by mouth daily.    . Fenofibrate 150 MG CAPS Take 1 capsule by mouth daily.    . Garlic 10 MG CAPS Take 1 tablet by mouth daily.    Marland Kitchen guaiFENesin (MUCINEX) 600 MG 12 hr tablet Take 600 mg by mouth 2 (two) times daily.    Marland Kitchen ibuprofen (ADVIL,MOTRIN) 200 MG tablet Take 400 mg by mouth every 6 (six) hours as needed for mild pain.    Marland Kitchen irbesartan (AVAPRO) 150 MG tablet Take 150 mg by mouth daily.  5  . naproxen sodium (ALEVE) 220  MG tablet Take 220 mg by mouth.    Marland Kitchen omeprazole (PRILOSEC) 20 MG capsule Take 20 mg by mouth daily.    . vitamin C (ASCORBIC ACID) 500 MG tablet Take 500 mg by mouth daily.     Social History   Socioeconomic History  . Marital status: Married    Spouse name: Not on file  . Number of children: Not on file  . Years of education: Not on file  . Highest education level: Not on file  Occupational History  . Not on file  Social Needs  . Financial resource strain: Not on file  . Food insecurity:    Worry: Not on file    Inability: Not on file  . Transportation needs:    Medical: Not on file    Non-medical: Not on file  Tobacco Use  . Smoking status: Current  Every Day Smoker    Packs/day: 0.25    Types: Cigarettes  . Smokeless tobacco: Never Used  Substance and Sexual Activity  . Alcohol use: No  . Drug use: No  . Sexual activity: Never  Lifestyle  . Physical activity:    Days per week: Not on file    Minutes per session: Not on file  . Stress: Not on file  Relationships  . Social connections:    Talks on phone: Not on file    Gets together: Not on file    Attends religious service: Not on file    Active member of club or organization: Not on file    Attends meetings of clubs or organizations: Not on file    Relationship status: Not on file  . Intimate partner violence:    Fear of current or ex partner: Not on file    Emotionally abused: Not on file    Physically abused: Not on file    Forced sexual activity: Not on file  Other Topics Concern  . Not on file  Social History Narrative  . Not on file   Family History  Problem Relation Age of Onset  . Dementia Mother   . Colon cancer Maternal Aunt 110  . Colon cancer Cousin 58       maternal  . Rectal cancer Neg Hx   . Stomach cancer Neg Hx   . Colon polyps Neg Hx   . Esophageal cancer Neg Hx      OBJECTIVE:  Vitals:   01/22/18 1008  BP: (!) 152/87  Pulse: 72  Resp: 18  Temp: 98.2 F (36.8 C)  SpO2: 100%    General appearance: AOx3 in no acute distress HEENT: NCAT.  Oropharynx clear.  Lungs: clear to auscultation bilaterally without adventitious breath sounds Heart: regular rate and rhythm.  Radial pulses 2+ symmetrical bilaterally Abdomen: soft, non-distended; normal active bowel sounds; non-tender to light and deep palpation; nontender at McBurney's point; negative Murphy's sign; negative rebound; no guarding Rectal: External exam positive for 3-4 external hemorrhoids, none appear thrombosed, no obvious masses or fissures appreciated.  Internal: hard stool appreciated in the 12 o'clock position unable to disimpact, hemoccult negative  Extremities: no edema;  symmetrical with no gross deformities Skin: warm and dry Neurologic: normal gait Psychological: alert and cooperative; normal mood and affect  LABS: Results for orders placed or performed during the hospital encounter of 01/22/18 (from the past 24 hour(s))  Occult blood, poc device     Status: None   Collection Time: 01/22/18 10:44 AM  Result Value Ref Range   Fecal Occult Bld  NEGATIVE NEGATIVE    ASSESSMENT & PLAN:  1. Impacted stool in rectum (HCC)   2. Hard stool     Meds ordered this encounter  Medications  . polyethylene glycol (MIRALAX / GLYCOLAX) packet    Sig: Take 17 g by mouth daily.    Dispense:  14 each    Refill:  0    Order Specific Question:   Supervising Provider    Answer:   Wynona Luna [phone]  . Enema Mineral Oil ENEM    Sig: Place 1 application rectally daily.    Dispense:  133 mL    Refill:  0    Order Specific Question:   Supervising Provider    Answer:   Wynona Luna [[id number]]   Drink plenty of water and incorporate fiber rich foods in your diet Exercise daily; such as taking a 30 minute brisk walk Continue OTC stool softer daily OR miralax prescribed Use OTC fleet enema OR enema mineral oil enema prescribed Follow up with PCP if symptoms persists Return or go to the ED if you have any new or worsening symptoms  Reviewed expectations re: course of current medical issues. Questions answered. Outlined signs and symptoms indicating need for more acute intervention. Patient verbalized understanding. After Visit Summary given.   Lestine Box, PA-C 01/22/18 [place], [place], PA-C 01/22/18 1105

## 2018-01-22 NOTE — ED Triage Notes (Signed)
Pt states she feels like there is some stool sitting inside her rectum she is unable to push out.

## 2018-01-22 NOTE — Discharge Instructions (Addendum)
Drink plenty of water and incorporate fiber rich foods in your diet Exercise daily; such as taking a 30 minute brisk walk Continue OTC stool softer daily OR miralax prescribed Use OTC fleet enema OR enema mineral oil enema prescribed Follow up with PCP if symptoms persists Return or go to the ED if you have any new or worsening symptoms

## 2018-01-25 ENCOUNTER — Ambulatory Visit (INDEPENDENT_AMBULATORY_CARE_PROVIDER_SITE_OTHER): Payer: Medicare HMO | Admitting: *Deleted

## 2018-01-25 DIAGNOSIS — I441 Atrioventricular block, second degree: Secondary | ICD-10-CM | POA: Diagnosis not present

## 2018-01-25 NOTE — Progress Notes (Signed)
Remote pacemaker transmission.   

## 2018-02-13 LAB — CUP PACEART REMOTE DEVICE CHECK
Brady Statistic AP VS Percent: 2.2 %
Brady Statistic AS VP Percent: 5.3 %
Brady Statistic RA Percent Paced: 2.2 %
Brady Statistic RV Percent Paced: 5.4 %
Date Time Interrogation Session: 20190701082817
Implantable Lead Implant Date: 20180404
Implantable Lead Location: 753859
Implantable Lead Location: 753860
Implantable Lead Model: 3830
Lead Channel Pacing Threshold Amplitude: 0.75 V
Lead Channel Pacing Threshold Pulse Width: 0.4 ms
Lead Channel Pacing Threshold Pulse Width: 1 ms
Lead Channel Sensing Intrinsic Amplitude: 12 mV
Lead Channel Setting Pacing Amplitude: 2 V
Lead Channel Setting Pacing Amplitude: 2.5 V
Lead Channel Setting Pacing Pulse Width: 1 ms
Lead Channel Setting Sensing Sensitivity: 2 mV
MDC IDC LEAD IMPLANT DT: 20180404
MDC IDC MSMT BATTERY REMAINING LONGEVITY: 119 mo
MDC IDC MSMT BATTERY REMAINING PERCENTAGE: 95.5 %
MDC IDC MSMT BATTERY VOLTAGE: 3.01 V
MDC IDC MSMT LEADCHNL RA IMPEDANCE VALUE: 490 Ohm
MDC IDC MSMT LEADCHNL RA PACING THRESHOLD AMPLITUDE: 0.5 V
MDC IDC MSMT LEADCHNL RA SENSING INTR AMPL: 4.1 mV
MDC IDC MSMT LEADCHNL RV IMPEDANCE VALUE: 600 Ohm
MDC IDC PG IMPLANT DT: 20180404
MDC IDC STAT BRADY AP VP PERCENT: 1 %
MDC IDC STAT BRADY AS VS PERCENT: 92 %
Pulse Gen Model: 2272
Pulse Gen Serial Number: 8002284

## 2018-02-17 DIAGNOSIS — H04123 Dry eye syndrome of bilateral lacrimal glands: Secondary | ICD-10-CM | POA: Diagnosis not present

## 2018-02-17 DIAGNOSIS — H2513 Age-related nuclear cataract, bilateral: Secondary | ICD-10-CM | POA: Diagnosis not present

## 2018-02-17 DIAGNOSIS — H17822 Peripheral opacity of cornea, left eye: Secondary | ICD-10-CM | POA: Diagnosis not present

## 2018-03-02 DIAGNOSIS — K5901 Slow transit constipation: Secondary | ICD-10-CM | POA: Diagnosis not present

## 2018-03-10 ENCOUNTER — Other Ambulatory Visit: Payer: Self-pay

## 2018-03-10 ENCOUNTER — Encounter (HOSPITAL_BASED_OUTPATIENT_CLINIC_OR_DEPARTMENT_OTHER): Payer: Self-pay | Admitting: Emergency Medicine

## 2018-03-10 ENCOUNTER — Emergency Department (HOSPITAL_BASED_OUTPATIENT_CLINIC_OR_DEPARTMENT_OTHER): Payer: Medicare HMO

## 2018-03-10 ENCOUNTER — Emergency Department (HOSPITAL_BASED_OUTPATIENT_CLINIC_OR_DEPARTMENT_OTHER)
Admission: EM | Admit: 2018-03-10 | Discharge: 2018-03-10 | Disposition: A | Discharge status: Home / Self Care | Payer: Medicare HMO | Attending: Emergency Medicine | Admitting: Emergency Medicine

## 2018-03-10 DIAGNOSIS — I10 Essential (primary) hypertension: Secondary | ICD-10-CM | POA: Insufficient documentation

## 2018-03-10 DIAGNOSIS — K5900 Constipation, unspecified: Secondary | ICD-10-CM | POA: Insufficient documentation

## 2018-03-10 DIAGNOSIS — R1030 Lower abdominal pain, unspecified: Secondary | ICD-10-CM

## 2018-03-10 DIAGNOSIS — R109 Unspecified abdominal pain: Secondary | ICD-10-CM | POA: Diagnosis not present

## 2018-03-10 DIAGNOSIS — F17210 Nicotine dependence, cigarettes, uncomplicated: Secondary | ICD-10-CM | POA: Diagnosis not present

## 2018-03-10 LAB — CBC
HCT: 36.2 % (ref 36.0–46.0)
Hemoglobin: 11.7 g/dL — ABNORMAL LOW (ref 12.0–15.0)
MCH: 27 pg (ref 26.0–34.0)
MCHC: 32.3 g/dL (ref 30.0–36.0)
MCV: 83.6 fL (ref 78.0–100.0)
PLATELETS: 323 10*3/uL (ref 150–400)
RBC: 4.33 MIL/uL (ref 3.87–5.11)
RDW: 13.8 % (ref 11.5–15.5)
WBC: 7.4 10*3/uL (ref 4.0–10.5)

## 2018-03-10 LAB — COMPREHENSIVE METABOLIC PANEL
ALT: 10 U/L (ref 0–44)
AST: 17 U/L (ref 15–41)
Albumin: 3.8 g/dL (ref 3.5–5.0)
Alkaline Phosphatase: 54 U/L (ref 38–126)
Anion gap: 10 (ref 5–15)
BUN: 13 mg/dL (ref 8–23)
CHLORIDE: 107 mmol/L (ref 98–111)
CO2: 21 mmol/L — AB (ref 22–32)
CREATININE: 0.69 mg/dL (ref 0.44–1.00)
Calcium: 9 mg/dL (ref 8.9–10.3)
GFR calc Af Amer: >60 mL/min (ref >60)
GFR calc non Af Amer: >60 mL/min (ref >60)
Glucose, Bld: 99 mg/dL (ref 70–99)
Potassium: 4.8 mmol/L (ref 3.5–5.1)
SODIUM: 138 mmol/L (ref 135–145)
Total Bilirubin: 0.4 mg/dL (ref 0.3–1.2)
Total Protein: 6.7 g/dL (ref 6.5–8.1)

## 2018-03-10 LAB — URINALYSIS, ROUTINE W REFLEX MICROSCOPIC
Bilirubin Urine: NEGATIVE
Glucose, UA: NEGATIVE mg/dL
HGB URINE DIPSTICK: NEGATIVE
Ketones, ur: NEGATIVE mg/dL
Leukocytes, UA: NEGATIVE
Nitrite: NEGATIVE
Protein, ur: NEGATIVE mg/dL
SPECIFIC GRAVITY, URINE: 1.01 (ref 1.005–1.030)
pH: 6.5 (ref 5.0–8.0)

## 2018-03-10 LAB — LIPASE, BLOOD: Lipase: 60 U/L — ABNORMAL HIGH (ref 11–51)

## 2018-03-10 MED ORDER — IOPAMIDOL (ISOVUE-300) INJECTION 61%
100.0000 mL | Freq: Once | INTRAVENOUS | Status: AC | PRN
  Administered 2018-03-10: 100 mL via INTRAVENOUS

## 2018-03-10 MED ORDER — KETOROLAC TROMETHAMINE 15 MG/ML IJ SOLN
15.0000 mg | Freq: Once | INTRAMUSCULAR | Status: AC
  Administered 2018-03-10: 15 mg via INTRAVENOUS
  Filled 2018-03-10: qty 1

## 2018-03-10 MED ORDER — PEG 3350-KCL-NABCB-NACL-NASULF 236 G PO SOLR: 4000.0000 mL | Freq: Once | ORAL | 0 refills | Status: AC

## 2018-03-10 MED ORDER — IOPAMIDOL (ISOVUE-300) INJECTION 61%
30.0000 mL | Freq: Once | INTRAVENOUS | Status: AC | PRN
  Administered 2018-03-10: 30 mL via ORAL

## 2018-03-10 NOTE — ED Provider Notes (Signed)
[facility] Emergency Department Provider Note MRN:  [id number]  Arrival date & time: 03/10/18     Chief Complaint   Abdominal Pain   History of Present Illness   Denise Hodges is a 72 y.o. year-old female with a history of pacemaker, HTN presenting to the ED with chief complaint of abdominal pain.  The pain is located in the lower abdomen bilaterally, the pain is been constant for 1 week.  Gradual onset.  Had been struggling with constipation prior to this pain, which resolved with stool softeners.  Now with continued moderate to severe lower abdominal pain, associated with pain with urination.  Denies nausea or vomiting, no headache or vision change, no chest pain or shortness of breath, no upper abdominal pain, no numbness weakness in the arms or legs.  No exacerbating or alleviating factors.  Review of Systems  A complete 10 system review of systems was obtained and all systems are negative except as noted in the HPI and PMH.   Patient's Health History    Past Medical History:  Diagnosis Date  . Allergy   . Arthritis   . Bradycardia    pacemaker placed in April 2018  Medtronic  . GERD (gastroesophageal reflux disease)   . Hyperlipidemia   . Hypertension   . Seasonal allergies     Past Surgical History:  Procedure Laterality Date  . ABDOMINAL HYSTERECTOMY  1995  . COLONOSCOPY  2013   brodie  . FOOT SURGERY  1985   bilateral for flat feet with pain  . PACEMAKER IMPLANT N/A 10/29/2016   Procedure: Pacemaker Implant;  Surgeon: Evans Lance, MD;  Location: [facility];  Service: Cardiovascular;  Laterality: N/A;  . POLYPECTOMY    . TONSILLECTOMY  as child  . TOTAL SHOULDER ARTHROPLASTY Right 12/30/2012   Procedure: RIGHT TOTAL SHOULDER ARTHROPLASTY;  Surgeon: Nita Sells, MD;  Location: [facility];  Service: Orthopedics;  Laterality: Right;  interscaline block  . WISDOM TOOTH EXTRACTION      Family History  Problem Relation Age  of Onset  . Dementia Mother   . Colon cancer Maternal Aunt 43  . Colon cancer Cousin 8       maternal  . Rectal cancer Neg Hx   . Stomach cancer Neg Hx   . Colon polyps Neg Hx   . Esophageal cancer Neg Hx     Social History   Socioeconomic History  . Marital status: Married    Spouse name: Not on file  . Number of children: Not on file  . Years of education: Not on file  . Highest education level: Not on file  Occupational History  . Not on file  Social Needs  . Financial resource strain: Not on file  . Food insecurity:    Worry: Not on file    Inability: Not on file  . Transportation needs:    Medical: Not on file    Non-medical: Not on file  Tobacco Use  . Smoking status: Current Every Day Smoker    Packs/day: 0.25    Types: Cigarettes  . Smokeless tobacco: Never Used  Substance and Sexual Activity  . Alcohol use: No  . Drug use: No  . Sexual activity: Never  Lifestyle  . Physical activity:    Days per week: Not on file    Minutes per session: Not on file  . Stress: Not on file  Relationships  . Social connections:    Talks  on phone: Not on file    Gets together: Not on file    Attends religious service: Not on file    Active member of club or organization: Not on file    Attends meetings of clubs or organizations: Not on file    Relationship status: Not on file  . Intimate partner violence:    Fear of current or ex partner: Not on file    Emotionally abused: Not on file    Physically abused: Not on file    Forced sexual activity: Not on file  Other Topics Concern  . Not on file  Social History Narrative  . Not on file     Physical Exam  Vital Signs and Nursing Notes reviewed Vitals:   03/10/18 1209 03/10/18 1434  BP: (!) 169/75 (!) 154/95  Pulse: 60 62  Resp: 18 18  Temp:    SpO2: 100% 100%    CONSTITUTIONAL: Well-appearing, NAD NEURO:  Alert and oriented x 3, no focal deficits EYES:  eyes equal and reactive ENT/NECK:  no LAD, no  JVD CARDIO: Regular rate, well-perfused, normal S1 and S2 PULM:  CTAB no wheezing or rhonchi GI/GU:  normal bowel sounds, non-distended, non-tender MSK/SPINE:  No gross deformities, no edema SKIN:  no rash, atraumatic PSYCH:  Appropriate speech and behavior  Diagnostic and Interventional Summary    EKG Interpretation  Date/Time:    Ventricular Rate:    PR Interval:    QRS Duration:   QT Interval:    QTC Calculation:   R Axis:     Text Interpretation:        Labs Reviewed  CBC - Abnormal; Notable for the following components:      Result Value   Hemoglobin 11.7 (*)    All other components within normal limits  COMPREHENSIVE METABOLIC PANEL - Abnormal; Notable for the following components:   CO2 21 (*)    All other components within normal limits  LIPASE, BLOOD - Abnormal; Notable for the following components:   Lipase 60 (*)    All other components within normal limits  URINALYSIS, ROUTINE W REFLEX MICROSCOPIC    CT Abdomen Pelvis W Contrast  Final Result      Medications  ketorolac (TORADOL) 15 MG/ML injection 15 mg (15 mg Intravenous Given 03/10/18 1125)  iopamidol (ISOVUE-300) 61 % injection 30 mL (30 mLs Oral Contrast Given 03/10/18 1100)  iopamidol (ISOVUE-300) 61 % injection 100 mL (100 mLs Intravenous Contrast Given 03/10/18 1305)     Fecal disimpaction Date/Time: 03/10/2018 2:57 PM Performed by: Maudie Flakes, MD Authorized by: Maudie Flakes, MD  Consent: Verbal consent obtained. Risks and benefits: risks, benefits and alternatives were discussed Consent given by: patient Patient tolerance: Patient tolerated the procedure well with no immediate complications Comments: Solid stool adhered to rectal wall was loosened.     Critical Care  ED Course and Medical Decision Making  I have reviewed the triage vital signs and the nursing notes.  Pertinent labs & imaging results that were available during my care of the patient were reviewed by me and  considered in my medical decision making (see below for details).  72 year old female history of hypertension, pacemaker presenting with lower abdominal pain.  Vital signs stable, afebrile, well-appearing, moderate lower abdominal tenderness.  Considering UTI versus diverticulitis, work-up pending.  Clinical Course as of Mar 10 1458  Wed Mar 10, 5820  3285 72 year old female generally healthy here with constipation, lower abdominal pain, also with dysuria.  States that her constipation is improving, unclear etiology of pain, still considering diverticulitis given tenderness on exam.  CT reveals desiccated stool, moderate stool burden.  Upon further evaluation and questioning, patient feels that she has impacted stool.  Fecal disimpaction provided, enema to follow.   [MB]    Clinical Course User Index [MB] Maudie Flakes, MD    While sitting up for enema, patient became frustrated with the wait and requested discharge.  Given prescription for GoLYTELY, will return if symptoms worsen.  After the discussed management above, the patient was determined to be safe for discharge.  The patient was in agreement with this plan and all questions regarding their care were answered.  ED return precautions were discussed and the patient will return to the ED with any significant worsening of condition.  Barth Kirks. Sedonia Small, [place] [email or web address]  Final Clinical Impressions(s) / ED Diagnoses     ICD-10-CM   1. Lower abdominal pain R10.30   2. Constipation, unspecified constipation type K59.00     ED Discharge Orders         Ordered    polyethylene glycol (GOLYTELY) 236 g solution   Once     03/10/18 1453             Maudie Flakes, MD 03/10/18 1459

## 2018-03-10 NOTE — ED Notes (Signed)
Pt given oral contrast and instructed to sip on both bottles. She verbalized understanding.

## 2018-03-10 NOTE — ED Notes (Signed)
ED Provider at bedside. 

## 2018-03-10 NOTE — Discharge Instructions (Addendum)
You were evaluated in the Emergency Department and after careful evaluation, we did not find any emergent condition requiring admission or further testing in the hospital.  Your symptoms today seem to be due to constipation.  You also had some fecal impaction, which we attempted to remove.  Please use the medication provided to allow for resolution of your constipation.  Please return to the Emergency Department if you experience any worsening of your condition.  We encourage you to follow up with a primary care provider.  Thank you for allowing [place] to be a part of your care.

## 2018-03-10 NOTE — ED Triage Notes (Signed)
Lower abd pain x 1 week. Hx of chronic constipation. Saw her PCP this week and given medications. LBM this morning.

## 2018-03-10 NOTE — ED Notes (Signed)
Patient transported to CT 

## 2018-03-10 NOTE — ED Notes (Signed)
IV attempted x1 without success

## 2018-03-10 NOTE — ED Notes (Signed)
Pt refusing enema and requesting to speak with charge nurse.

## 2018-04-07 DIAGNOSIS — K5909 Other constipation: Secondary | ICD-10-CM | POA: Diagnosis not present

## 2018-04-07 DIAGNOSIS — R1030 Lower abdominal pain, unspecified: Secondary | ICD-10-CM | POA: Diagnosis not present

## 2018-04-07 DIAGNOSIS — Z86010 Personal history of colonic polyps: Secondary | ICD-10-CM | POA: Diagnosis not present

## 2018-04-07 DIAGNOSIS — R748 Abnormal levels of other serum enzymes: Secondary | ICD-10-CM | POA: Diagnosis not present

## 2018-04-09 ENCOUNTER — Other Ambulatory Visit: Payer: Self-pay | Admitting: Internal Medicine

## 2018-04-22 DIAGNOSIS — I10 Essential (primary) hypertension: Secondary | ICD-10-CM | POA: Diagnosis not present

## 2018-04-22 DIAGNOSIS — E782 Mixed hyperlipidemia: Secondary | ICD-10-CM | POA: Diagnosis not present

## 2018-04-22 DIAGNOSIS — K5901 Slow transit constipation: Secondary | ICD-10-CM | POA: Diagnosis not present

## 2018-04-22 DIAGNOSIS — Z23 Encounter for immunization: Secondary | ICD-10-CM | POA: Diagnosis not present

## 2018-04-26 ENCOUNTER — Ambulatory Visit (INDEPENDENT_AMBULATORY_CARE_PROVIDER_SITE_OTHER): Payer: Medicare HMO | Admitting: *Deleted

## 2018-04-26 DIAGNOSIS — I441 Atrioventricular block, second degree: Secondary | ICD-10-CM | POA: Diagnosis not present

## 2018-04-27 NOTE — Progress Notes (Signed)
Remote pacemaker transmission.   

## 2018-05-06 DIAGNOSIS — Z86010 Personal history of colonic polyps: Secondary | ICD-10-CM | POA: Diagnosis not present

## 2018-05-06 DIAGNOSIS — R748 Abnormal levels of other serum enzymes: Secondary | ICD-10-CM | POA: Diagnosis not present

## 2018-05-06 DIAGNOSIS — K5909 Other constipation: Secondary | ICD-10-CM | POA: Diagnosis not present

## 2018-05-06 DIAGNOSIS — R1030 Lower abdominal pain, unspecified: Secondary | ICD-10-CM | POA: Diagnosis not present

## 2018-05-20 LAB — CUP PACEART REMOTE DEVICE CHECK
Battery Remaining Longevity: 116 mo
Battery Remaining Percentage: 95.5 %
Brady Statistic AP VP Percent: 1 %
Brady Statistic AP VS Percent: 2.4 %
Brady Statistic AS VP Percent: 15 %
Brady Statistic RA Percent Paced: 2.5 %
Implantable Lead Implant Date: 20180404
Implantable Lead Location: 753859
Implantable Lead Location: 753860
Implantable Lead Model: 3830
Lead Channel Impedance Value: 480 Ohm
Lead Channel Pacing Threshold Amplitude: 0.75 V
Lead Channel Sensing Intrinsic Amplitude: 12 mV
Lead Channel Setting Pacing Amplitude: 2 V
Lead Channel Setting Pacing Amplitude: 2.5 V
Lead Channel Setting Pacing Pulse Width: 1 ms
MDC IDC LEAD IMPLANT DT: 20180404
MDC IDC MSMT BATTERY VOLTAGE: 2.99 V
MDC IDC MSMT LEADCHNL RA PACING THRESHOLD AMPLITUDE: 0.5 V
MDC IDC MSMT LEADCHNL RA PACING THRESHOLD PULSEWIDTH: 0.4 ms
MDC IDC MSMT LEADCHNL RA SENSING INTR AMPL: 4.9 mV
MDC IDC MSMT LEADCHNL RV IMPEDANCE VALUE: 610 Ohm
MDC IDC MSMT LEADCHNL RV PACING THRESHOLD PULSEWIDTH: 1 ms
MDC IDC PG IMPLANT DT: 20180404
MDC IDC PG SERIAL: 8002284
MDC IDC SESS DTM: 20190930070921
MDC IDC SET LEADCHNL RV SENSING SENSITIVITY: 2 mV
MDC IDC STAT BRADY AS VS PERCENT: 83 %
MDC IDC STAT BRADY RV PERCENT PACED: 15 %

## 2018-06-23 DIAGNOSIS — R1030 Lower abdominal pain, unspecified: Secondary | ICD-10-CM | POA: Diagnosis not present

## 2018-06-23 DIAGNOSIS — K5909 Other constipation: Secondary | ICD-10-CM | POA: Diagnosis not present

## 2018-06-23 DIAGNOSIS — K649 Unspecified hemorrhoids: Secondary | ICD-10-CM | POA: Diagnosis not present

## 2018-06-23 DIAGNOSIS — Z86010 Personal history of colonic polyps: Secondary | ICD-10-CM | POA: Diagnosis not present

## 2018-06-23 DIAGNOSIS — D649 Anemia, unspecified: Secondary | ICD-10-CM | POA: Diagnosis not present

## 2018-06-30 DIAGNOSIS — K644 Residual hemorrhoidal skin tags: Secondary | ICD-10-CM | POA: Diagnosis not present

## 2018-06-30 DIAGNOSIS — K649 Unspecified hemorrhoids: Secondary | ICD-10-CM | POA: Diagnosis not present

## 2018-06-30 DIAGNOSIS — K5900 Constipation, unspecified: Secondary | ICD-10-CM | POA: Diagnosis not present

## 2018-06-30 DIAGNOSIS — D124 Benign neoplasm of descending colon: Secondary | ICD-10-CM | POA: Diagnosis not present

## 2018-07-02 DIAGNOSIS — D124 Benign neoplasm of descending colon: Secondary | ICD-10-CM | POA: Diagnosis not present

## 2018-07-26 ENCOUNTER — Ambulatory Visit (INDEPENDENT_AMBULATORY_CARE_PROVIDER_SITE_OTHER): Payer: Medicare HMO

## 2018-07-26 DIAGNOSIS — D649 Anemia, unspecified: Secondary | ICD-10-CM | POA: Diagnosis not present

## 2018-07-26 DIAGNOSIS — I441 Atrioventricular block, second degree: Secondary | ICD-10-CM | POA: Diagnosis not present

## 2018-07-26 NOTE — Progress Notes (Signed)
Remote pacemaker transmission.   

## 2018-07-27 DIAGNOSIS — R921 Mammographic calcification found on diagnostic imaging of breast: Secondary | ICD-10-CM | POA: Diagnosis not present

## 2018-07-27 LAB — CUP PACEART REMOTE DEVICE CHECK
Battery Remaining Longevity: 117 mo
Brady Statistic AP VP Percent: 1 %
Brady Statistic AP VS Percent: 3 %
Brady Statistic AS VP Percent: 13 %
Brady Statistic RA Percent Paced: 3 %
Implantable Lead Location: 753859
Implantable Lead Location: 753860
Implantable Pulse Generator Implant Date: 20180404
Lead Channel Impedance Value: 490 Ohm
Lead Channel Pacing Threshold Amplitude: 0.5 V
Lead Channel Pacing Threshold Amplitude: 0.75 V
Lead Channel Pacing Threshold Pulse Width: 0.4 ms
Lead Channel Sensing Intrinsic Amplitude: 12 mV
Lead Channel Setting Pacing Amplitude: 2 V
Lead Channel Setting Pacing Pulse Width: 1 ms
MDC IDC LEAD IMPLANT DT: 20180404
MDC IDC LEAD IMPLANT DT: 20180404
MDC IDC MSMT BATTERY REMAINING PERCENTAGE: 95.5 %
MDC IDC MSMT BATTERY VOLTAGE: 3.01 V
MDC IDC MSMT LEADCHNL RA SENSING INTR AMPL: 4.6 mV
MDC IDC MSMT LEADCHNL RV IMPEDANCE VALUE: 600 Ohm
MDC IDC MSMT LEADCHNL RV PACING THRESHOLD PULSEWIDTH: 1 ms
MDC IDC PG SERIAL: 8002284
MDC IDC SESS DTM: 20191230070015
MDC IDC SET LEADCHNL RV PACING AMPLITUDE: 2.5 V
MDC IDC SET LEADCHNL RV SENSING SENSITIVITY: 2 mV
MDC IDC STAT BRADY AS VS PERCENT: 84 %
MDC IDC STAT BRADY RV PERCENT PACED: 13 %
Pulse Gen Model: 2272

## 2018-08-24 DIAGNOSIS — K5909 Other constipation: Secondary | ICD-10-CM | POA: Diagnosis not present

## 2018-08-24 DIAGNOSIS — D649 Anemia, unspecified: Secondary | ICD-10-CM | POA: Diagnosis not present

## 2018-08-24 DIAGNOSIS — Z86010 Personal history of colonic polyps: Secondary | ICD-10-CM | POA: Diagnosis not present

## 2018-09-01 ENCOUNTER — Other Ambulatory Visit: Payer: Self-pay | Admitting: Internal Medicine

## 2018-10-14 DIAGNOSIS — Z950 Presence of cardiac pacemaker: Secondary | ICD-10-CM | POA: Diagnosis not present

## 2018-10-14 DIAGNOSIS — E782 Mixed hyperlipidemia: Secondary | ICD-10-CM | POA: Diagnosis not present

## 2018-10-14 DIAGNOSIS — I10 Essential (primary) hypertension: Secondary | ICD-10-CM | POA: Diagnosis not present

## 2018-10-14 DIAGNOSIS — Z0000 Encounter for general adult medical examination without abnormal findings: Secondary | ICD-10-CM | POA: Diagnosis not present

## 2018-10-14 DIAGNOSIS — Z1389 Encounter for screening for other disorder: Secondary | ICD-10-CM | POA: Diagnosis not present

## 2018-10-14 DIAGNOSIS — F17200 Nicotine dependence, unspecified, uncomplicated: Secondary | ICD-10-CM | POA: Diagnosis not present

## 2018-10-18 DIAGNOSIS — E782 Mixed hyperlipidemia: Secondary | ICD-10-CM | POA: Diagnosis not present

## 2018-10-18 DIAGNOSIS — I10 Essential (primary) hypertension: Secondary | ICD-10-CM | POA: Diagnosis not present

## 2018-10-25 ENCOUNTER — Ambulatory Visit (INDEPENDENT_AMBULATORY_CARE_PROVIDER_SITE_OTHER): Payer: Medicare HMO | Admitting: *Deleted

## 2018-10-25 ENCOUNTER — Other Ambulatory Visit: Payer: Self-pay

## 2018-10-25 DIAGNOSIS — I441 Atrioventricular block, second degree: Secondary | ICD-10-CM | POA: Diagnosis not present

## 2018-10-25 DIAGNOSIS — Z950 Presence of cardiac pacemaker: Secondary | ICD-10-CM

## 2018-10-26 ENCOUNTER — Telehealth: Payer: Self-pay | Admitting: Internal Medicine

## 2018-10-26 LAB — CUP PACEART REMOTE DEVICE CHECK
Battery Remaining Percentage: 95.5 %
Battery Voltage: 2.99 V
Brady Statistic AS VS Percent: 71 %
Brady Statistic RV Percent Paced: 27 %
Date Time Interrogation Session: 20200330063340
Implantable Lead Implant Date: 20180404
Implantable Lead Implant Date: 20180404
Implantable Lead Location: 753859
Implantable Lead Model: 3830
Implantable Pulse Generator Implant Date: 20180404
Lead Channel Impedance Value: 610 Ohm
Lead Channel Pacing Threshold Amplitude: 0.5 V
Lead Channel Pacing Threshold Pulse Width: 0.4 ms
Lead Channel Pacing Threshold Pulse Width: 1 ms
Lead Channel Sensing Intrinsic Amplitude: 4.5 mV
Lead Channel Setting Pacing Amplitude: 2.5 V
Lead Channel Setting Sensing Sensitivity: 2 mV
MDC IDC LEAD LOCATION: 753860
MDC IDC MSMT BATTERY REMAINING LONGEVITY: 113 mo
MDC IDC MSMT LEADCHNL RA IMPEDANCE VALUE: 490 Ohm
MDC IDC MSMT LEADCHNL RV PACING THRESHOLD AMPLITUDE: 0.75 V
MDC IDC MSMT LEADCHNL RV SENSING INTR AMPL: 12 mV
MDC IDC SET LEADCHNL RA PACING AMPLITUDE: 2 V
MDC IDC SET LEADCHNL RV PACING PULSEWIDTH: 1 ms
MDC IDC STAT BRADY AP VP PERCENT: 1 %
MDC IDC STAT BRADY AP VS PERCENT: 2.6 %
MDC IDC STAT BRADY AS VP PERCENT: 26 %
MDC IDC STAT BRADY RA PERCENT PACED: 2.8 %
Pulse Gen Model: 2272
Pulse Gen Serial Number: 8002284

## 2018-10-26 NOTE — Telephone Encounter (Signed)
New message   Called patient about visit with Dr. Lovena Le on 04.02.20, she would prefer to have visit in the office instead of virtual visit. She said she sent her transmission. Informed her scheduler would call once we were able to see patients in the office. Home number is the best contact number.

## 2018-10-26 NOTE — Telephone Encounter (Signed)
Put in recall for 2 months.

## 2018-10-28 ENCOUNTER — Encounter: Payer: Medicare HMO | Admitting: Internal Medicine

## 2018-11-04 NOTE — Progress Notes (Signed)
Remote pacemaker transmission.   

## 2018-11-05 ENCOUNTER — Other Ambulatory Visit: Payer: Self-pay | Admitting: Internal Medicine

## 2018-11-16 ENCOUNTER — Other Ambulatory Visit: Payer: Self-pay | Admitting: Internal Medicine

## 2018-11-16 MED ORDER — CARVEDILOL 6.25 MG PO TABS: 6.2500 mg | ORAL_TABLET | Freq: Two times a day (BID) | ORAL | 0 refills | Status: DC

## 2018-11-16 NOTE — Telephone Encounter (Signed)
Pt's medication was sent to pt's pharmacy as requested. Confirmation received.  °

## 2018-12-21 DIAGNOSIS — D649 Anemia, unspecified: Secondary | ICD-10-CM | POA: Diagnosis not present

## 2019-01-01 ENCOUNTER — Other Ambulatory Visit: Payer: Self-pay | Admitting: *Deleted

## 2019-01-01 DIAGNOSIS — Z20822 Contact with and (suspected) exposure to covid-19: Secondary | ICD-10-CM

## 2019-01-03 LAB — NOVEL CORONAVIRUS, NAA: SARS-CoV-2, NAA: NOT DETECTED

## 2019-01-06 ENCOUNTER — Other Ambulatory Visit: Payer: Self-pay | Admitting: Internal Medicine

## 2019-01-24 ENCOUNTER — Ambulatory Visit (INDEPENDENT_AMBULATORY_CARE_PROVIDER_SITE_OTHER): Payer: Medicare HMO | Admitting: *Deleted

## 2019-01-24 DIAGNOSIS — I441 Atrioventricular block, second degree: Secondary | ICD-10-CM

## 2019-01-25 LAB — CUP PACEART REMOTE DEVICE CHECK
Battery Remaining Longevity: 109 mo
Battery Remaining Percentage: 95.5 %
Battery Voltage: 2.99 V
Brady Statistic AP VP Percent: 1 %
Brady Statistic AP VS Percent: 2.1 %
Brady Statistic AS VP Percent: 41 %
Brady Statistic AS VS Percent: 57 %
Brady Statistic RA Percent Paced: 2.7 %
Brady Statistic RV Percent Paced: 41 %
Date Time Interrogation Session: 20200629060013
Implantable Lead Implant Date: 20180404
Implantable Lead Implant Date: 20180404
Implantable Lead Location: 753859
Implantable Lead Location: 753860
Implantable Lead Model: 3830
Implantable Pulse Generator Implant Date: 20180404
Lead Channel Impedance Value: 490 Ohm
Lead Channel Impedance Value: 610 Ohm
Lead Channel Pacing Threshold Amplitude: 0.5 V
Lead Channel Pacing Threshold Amplitude: 0.75 V
Lead Channel Pacing Threshold Pulse Width: 0.4 ms
Lead Channel Pacing Threshold Pulse Width: 1 ms
Lead Channel Sensing Intrinsic Amplitude: 12 mV
Lead Channel Sensing Intrinsic Amplitude: 4.3 mV
Lead Channel Setting Pacing Amplitude: 2 V
Lead Channel Setting Pacing Amplitude: 2.5 V
Lead Channel Setting Pacing Pulse Width: 1 ms
Lead Channel Setting Sensing Sensitivity: 2 mV
Pulse Gen Model: 2272
Pulse Gen Serial Number: 8002284

## 2019-02-01 DIAGNOSIS — D649 Anemia, unspecified: Secondary | ICD-10-CM | POA: Diagnosis not present

## 2019-02-01 DIAGNOSIS — Z86010 Personal history of colonic polyps: Secondary | ICD-10-CM | POA: Diagnosis not present

## 2019-02-05 ENCOUNTER — Encounter: Payer: Self-pay | Admitting: Cardiology

## 2019-02-05 NOTE — Progress Notes (Signed)
Remote pacemaker transmission.   

## 2019-02-08 DIAGNOSIS — R921 Mammographic calcification found on diagnostic imaging of breast: Secondary | ICD-10-CM | POA: Diagnosis not present

## 2019-02-09 ENCOUNTER — Telehealth: Payer: Self-pay | Admitting: Internal Medicine

## 2019-02-09 NOTE — Telephone Encounter (Signed)

## 2019-02-10 ENCOUNTER — Encounter (INDEPENDENT_AMBULATORY_CARE_PROVIDER_SITE_OTHER): Payer: Self-pay

## 2019-02-10 ENCOUNTER — Other Ambulatory Visit: Payer: Self-pay

## 2019-02-10 ENCOUNTER — Ambulatory Visit (INDEPENDENT_AMBULATORY_CARE_PROVIDER_SITE_OTHER): Payer: Medicare HMO | Admitting: Internal Medicine

## 2019-02-10 ENCOUNTER — Encounter: Payer: Self-pay | Admitting: Internal Medicine

## 2019-02-10 VITALS — BP 152/76 | HR 60 | Ht 64.0 in | Wt 124.0 lb

## 2019-02-10 DIAGNOSIS — D649 Anemia, unspecified: Secondary | ICD-10-CM | POA: Diagnosis not present

## 2019-02-10 DIAGNOSIS — I10 Essential (primary) hypertension: Secondary | ICD-10-CM

## 2019-02-10 DIAGNOSIS — I441 Atrioventricular block, second degree: Secondary | ICD-10-CM

## 2019-02-10 DIAGNOSIS — Z950 Presence of cardiac pacemaker: Secondary | ICD-10-CM | POA: Diagnosis not present

## 2019-02-10 LAB — CUP PACEART INCLINIC DEVICE CHECK
Battery Remaining Longevity: 117 mo
Battery Voltage: 2.99 V
Brady Statistic RA Percent Paced: 2.6 %
Brady Statistic RV Percent Paced: 44 %
Date Time Interrogation Session: 20200716143308
Implantable Lead Implant Date: 20180404
Implantable Lead Implant Date: 20180404
Implantable Lead Location: 753859
Implantable Lead Location: 753860
Implantable Lead Model: 3830
Implantable Pulse Generator Implant Date: 20180404
Lead Channel Impedance Value: 575 Ohm
Lead Channel Impedance Value: 650 Ohm
Lead Channel Pacing Threshold Amplitude: 0.5 V
Lead Channel Pacing Threshold Amplitude: 0.5 V
Lead Channel Pacing Threshold Amplitude: 0.5 V
Lead Channel Pacing Threshold Amplitude: 0.5 V
Lead Channel Pacing Threshold Pulse Width: 0.4 ms
Lead Channel Pacing Threshold Pulse Width: 0.4 ms
Lead Channel Pacing Threshold Pulse Width: 1 ms
Lead Channel Pacing Threshold Pulse Width: 1 ms
Lead Channel Sensing Intrinsic Amplitude: 5 mV
Lead Channel Setting Pacing Amplitude: 2 V
Lead Channel Setting Pacing Amplitude: 2.5 V
Lead Channel Setting Pacing Pulse Width: 1 ms
Lead Channel Setting Sensing Sensitivity: 2 mV
Pulse Gen Model: 2272
Pulse Gen Serial Number: 8002284

## 2019-02-10 MED ORDER — CARVEDILOL 6.25 MG PO TABS: 6.2500 mg | ORAL_TABLET | Freq: Two times a day (BID) | ORAL | 3 refills | Status: DC

## 2019-02-10 MED ORDER — IRBESARTAN 150 MG PO TABS: 150.0000 mg | ORAL_TABLET | Freq: Every day | ORAL | 3 refills | Status: DC

## 2019-02-10 NOTE — Patient Instructions (Addendum)
Medication Instructions:  Your physician recommends that you continue on your current medications as directed. Please refer to the Current Medication list given to you today.  Labwork: None ordered.  Testing/Procedures: None ordered.  Follow-Up: Your physician wants you to follow-up in: one year with Dr. Lovena Le.   You will receive a reminder letter in the mail two months in advance. If you don't receive a letter, please call our office to schedule the follow-up appointment.  Remote monitoring is used to monitor your Pacemaker from home. This monitoring reduces the number of office visits required to check your device to one time per year. It allows [place] to keep an eye on the functioning of your device to ensure it is working properly. You are scheduled for a device check from home on 04/25/2019. You may send your transmission at any time that day. If you have a wireless device, the transmission will be sent automatically. After your physician reviews your transmission, you will receive a postcard with your next transmission date.  Any Other Special Instructions Will Be Listed Below (If Applicable).  If you need a refill on your cardiac medications before your next appointment, please call your pharmacy.

## 2019-02-10 NOTE — Progress Notes (Signed)
HPI [name] returns today for evaluation of 2:1 AV block, s/p PPM insertion, who returns today for followup. In the interim, she has done well with no chest pain or sob. No syncope or edema. She denies palpitations.  Allergies  Allergen Reactions  . Macrobid [Nitrofurantoin Macrocrystal] Hives and Shortness Of Breath     Current Outpatient Medications  Medication Sig Dispense Refill  . aspirin 81 MG tablet Take 81 mg by mouth daily.    Marland Kitchen azithromycin (ZITHROMAX) 250 MG tablet Take 1 tablet (250 mg total) by mouth daily. Take first 2 tablets together, then 1 every day until finished. 6 tablet 0  . Calcium Carbonate-Vitamin D (CALCIUM 600 + D PO) Take 1 tablet by mouth daily.    . carvedilol (COREG) 6.25 MG tablet Take 1 tablet (6.25 mg total) by mouth 2 (two) times daily with a meal. Please make yearly appt with Dr. Lovena Le for June for future refills. 1st attempt 180 tablet 0  . cetirizine (ZYRTEC) 10 MG tablet Take 10 mg by mouth daily.    . cholecalciferol (VITAMIN D-400) 400 UNITS TABS Take 400 Units by mouth daily.    Marland Kitchen Cod Liver Oil 1000 MG CAPS Take 1 capsule by mouth daily.    . Enema Mineral Oil ENEM Place 1 application rectally daily. 133 mL 0  . fenofibrate 160 MG tablet Take 1 tablet by mouth daily.    . Garlic 10 MG CAPS Take 1 tablet by mouth daily.    Marland Kitchen guaiFENesin (MUCINEX) 600 MG 12 hr tablet Take 600 mg by mouth 2 (two) times daily.    Marland Kitchen ibuprofen (ADVIL,MOTRIN) 200 MG tablet Take 400 mg by mouth every 6 (six) hours as needed for mild pain.    Marland Kitchen irbesartan (AVAPRO) 150 MG tablet Take 150 mg by mouth daily.  5  . naproxen sodium (ALEVE) 220 MG tablet Take 220 mg by mouth.    Marland Kitchen omeprazole (PRILOSEC) 20 MG capsule Take 20 mg by mouth daily.    . polyethylene glycol (MIRALAX / GLYCOLAX) packet Take 17 g by mouth daily. 14 each 0  . vitamin C (ASCORBIC ACID) 500 MG tablet Take 500 mg by mouth daily.     No current facility-administered medications for this  visit.      Past Medical History:  Diagnosis Date  . Allergy   . Arthritis   . Bradycardia    pacemaker placed in April 2018  Medtronic  . GERD (gastroesophageal reflux disease)   . Hyperlipidemia   . Hypertension   . Seasonal allergies     ROS:   All systems reviewed and negative except as noted in the HPI.   Past Surgical History:  Procedure Laterality Date  . ABDOMINAL HYSTERECTOMY  1995  . COLONOSCOPY  2013   brodie  . FOOT SURGERY  1985   bilateral for flat feet with pain  . PACEMAKER IMPLANT N/A 10/29/2016   Procedure: Pacemaker Implant;  Surgeon: Evans Lance, MD;  Location: [facility];  Service: Cardiovascular;  Laterality: N/A;  . POLYPECTOMY    . TONSILLECTOMY  as child  . TOTAL SHOULDER ARTHROPLASTY Right 12/30/2012   Procedure: RIGHT TOTAL SHOULDER ARTHROPLASTY;  Surgeon: Nita Sells, MD;  Location: [facility];  Service: Orthopedics;  Laterality: Right;  interscaline block  . WISDOM TOOTH EXTRACTION       Family History  Problem Relation Age of Onset  . Dementia Mother   . Colon cancer Maternal  Aunt 70  . Colon cancer Cousin 84       maternal  . Rectal cancer Neg Hx   . Stomach cancer Neg Hx   . Colon polyps Neg Hx   . Esophageal cancer Neg Hx      Social History   Socioeconomic History  . Marital status: Married    Spouse name: Not on file  . Number of children: Not on file  . Years of education: Not on file  . Highest education level: Not on file  Occupational History  . Not on file  Social Needs  . Financial resource strain: Not on file  . Food insecurity    Worry: Not on file    Inability: Not on file  . Transportation needs    Medical: Not on file    Non-medical: Not on file  Tobacco Use  . Smoking status: Current Every Day Smoker    Packs/day: 0.25    Types: Cigarettes  . Smokeless tobacco: Never Used  Substance and Sexual Activity  . Alcohol use: No  . Drug use: No  . Sexual activity: Never  Lifestyle  .  Physical activity    Days per week: Not on file    Minutes per session: Not on file  . Stress: Not on file  Relationships  . Social Herbalist on phone: Not on file    Gets together: Not on file    Attends religious service: Not on file    Active member of club or organization: Not on file    Attends meetings of clubs or organizations: Not on file    Relationship status: Not on file  . Intimate partner violence    Fear of current or ex partner: Not on file    Emotionally abused: Not on file    Physically abused: Not on file    Forced sexual activity: Not on file  Other Topics Concern  . Not on file  Social History Narrative  . Not on file     BP (!) 152/76   Pulse 60   Ht 5\' 4"  (1.626 m)   Wt 124 lb (56.2 kg)   SpO2 99%   BMI 21.28 kg/m   Physical Exam:  Well appearing NAD HEENT: Unremarkable Neck:  No JVD, no thyromegally Lymphatics:  No adenopathy Back:  No CVA tenderness Lungs:  Clear HEART:  Regular rate rhythm, no murmurs, no rubs, no clicks Abd:  soft, positive bowel sounds, no organomegally, no rebound, no guarding Ext:  2 plus pulses, no edema, no cyanosis, no clubbing Skin:  No rashes no nodules Neuro:  CN II through XII intact, motor grossly intact  EKG - NSR with AV pacing  DEVICE  Normal device function.  See PaceArt for details.   Assess/Plan: 1. CHB - she is s/p PPM insertion and doing well.  2. PPM - her [employer] Jude DDD PM is working normally 3. HTN - she is encouraged to reduce her salt intake. I do not want her pressure too low.  Mikle Bosworth.D.

## 2019-02-16 DIAGNOSIS — M19021 Primary osteoarthritis, right elbow: Secondary | ICD-10-CM | POA: Diagnosis not present

## 2019-02-16 DIAGNOSIS — M25511 Pain in right shoulder: Secondary | ICD-10-CM | POA: Diagnosis not present

## 2019-02-16 DIAGNOSIS — Z96611 Presence of right artificial shoulder joint: Secondary | ICD-10-CM | POA: Diagnosis not present

## 2019-02-17 IMAGING — CT CT ABD-PELV W/ CM
2 of 5 series · 16 of 46 positions shown, 18 images · IV contrast (APPLIED)
Comparison: None.

CLINICAL DATA: Abdominal pain with diverticulitis suspected

EXAM:
CT ABDOMEN AND PELVIS WITH CONTRAST
TECHNIQUE: Multidetector CT imaging of the abdomen and pelvis was performed
using the standard protocol following bolus administration of
intravenous contrast.
CONTRAST:  30mL [id number] IOPAMIDOL ([id number]) INJECTION 61%,
100mL [id number] IOPAMIDOL ([id number]) INJECTION 61%

[Series 2: axial st · axial · 0.86mm/px · z∈[-381,+4]mm · 13 of 87 slices shown, 15 images]
[im 5/87  soft-tissue]
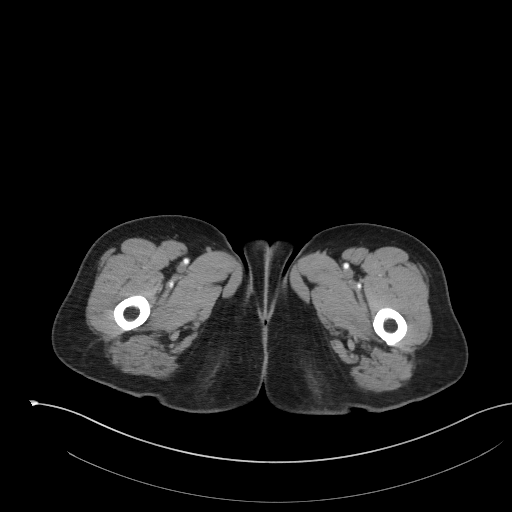
[im 5/87  bone]
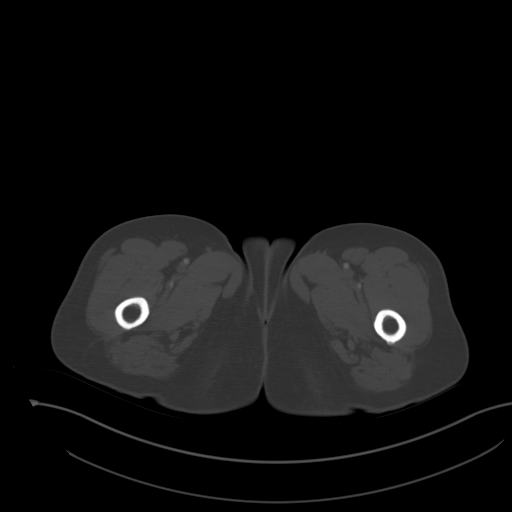
[im 14/87  soft-tissue]
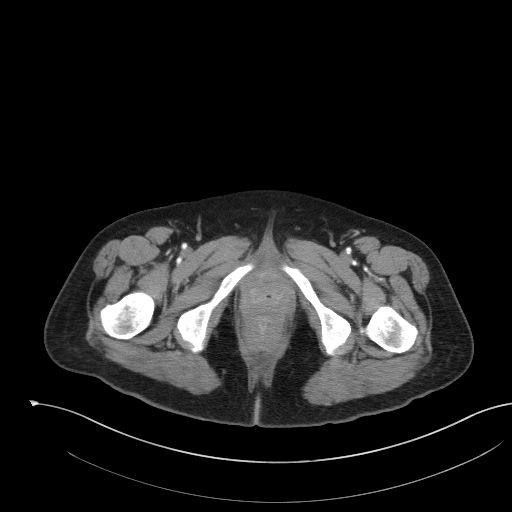
[im 19/87  soft-tissue]
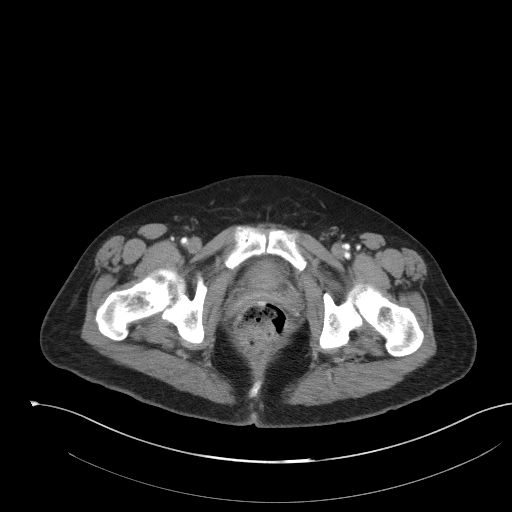
[im 23/87  soft-tissue]
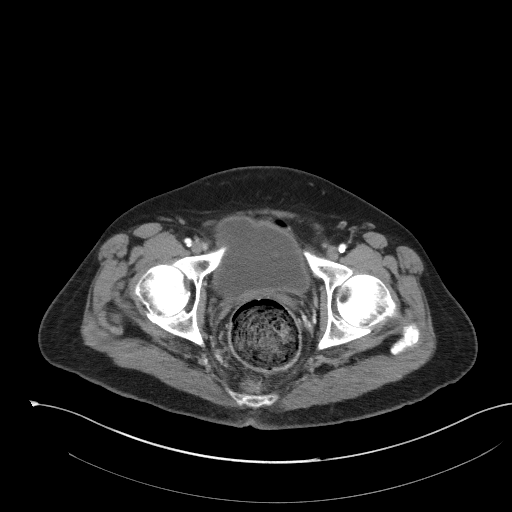
[im 32/87  soft-tissue]
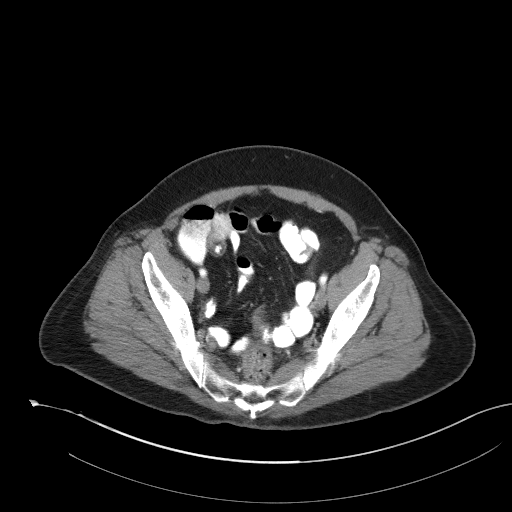
[im 37/87  soft-tissue]
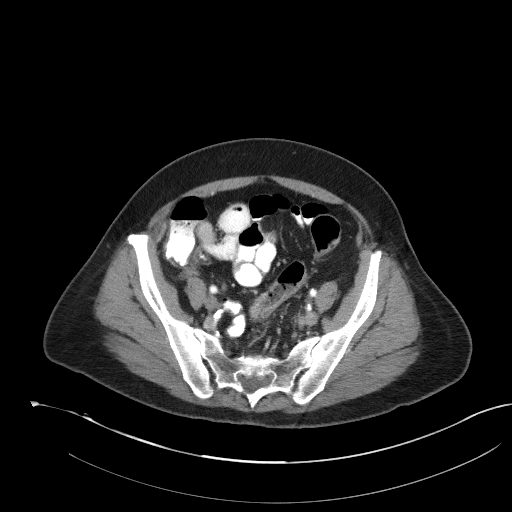
[im 46/87  soft-tissue]
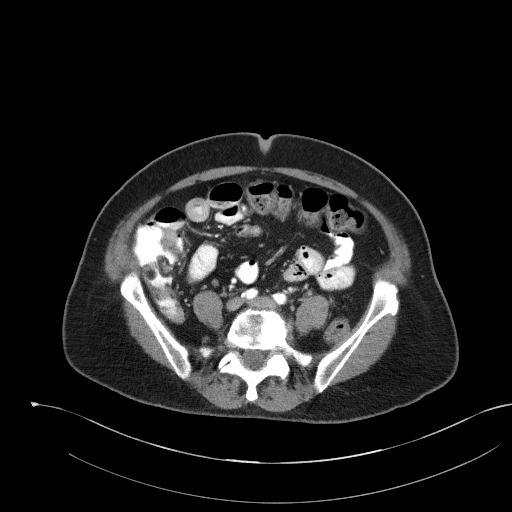
[im 50/87  soft-tissue]
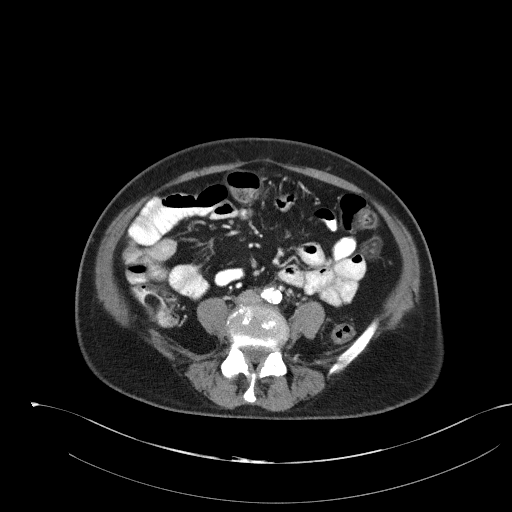
[im 55/87  soft-tissue]
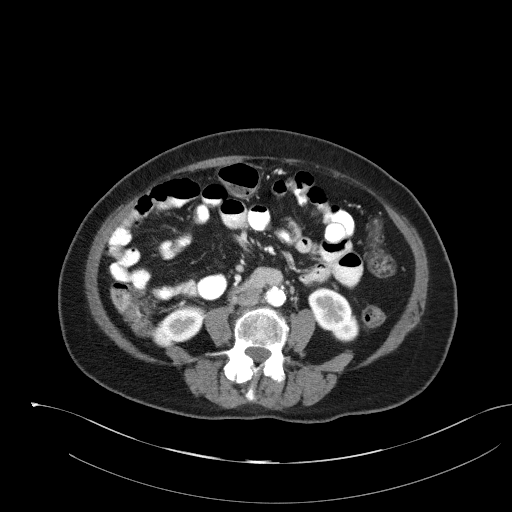
[im 55/87  bone]
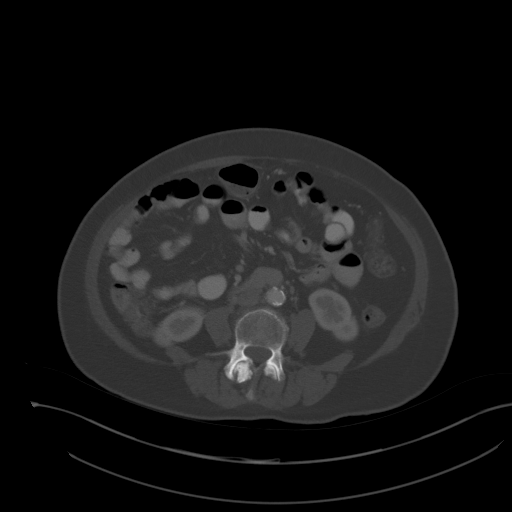
[im 64/87  soft-tissue]
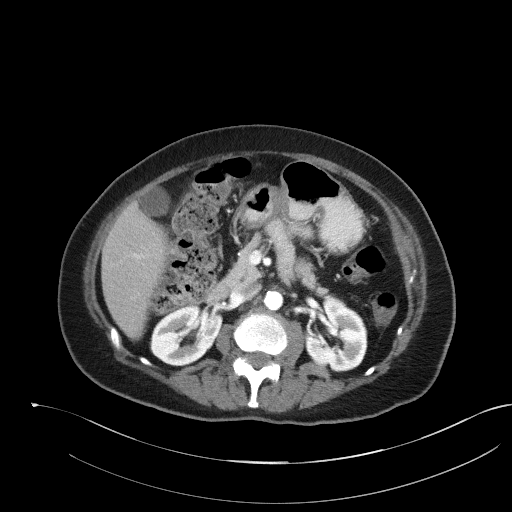
[im 68/87  soft-tissue]
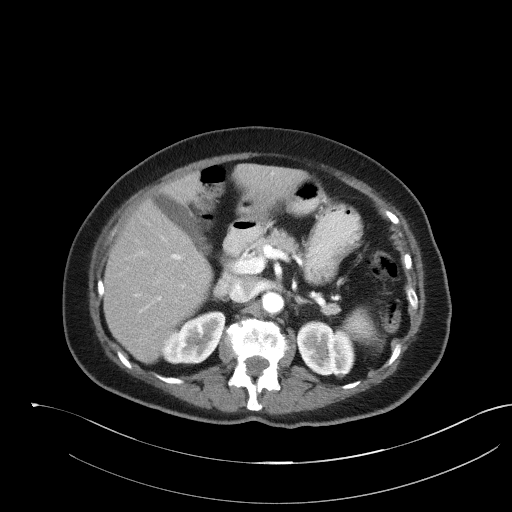
[im 73/87  soft-tissue]
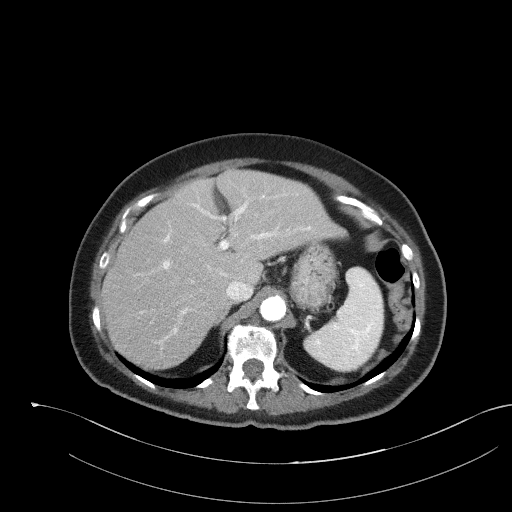
[im 82/87  soft-tissue]
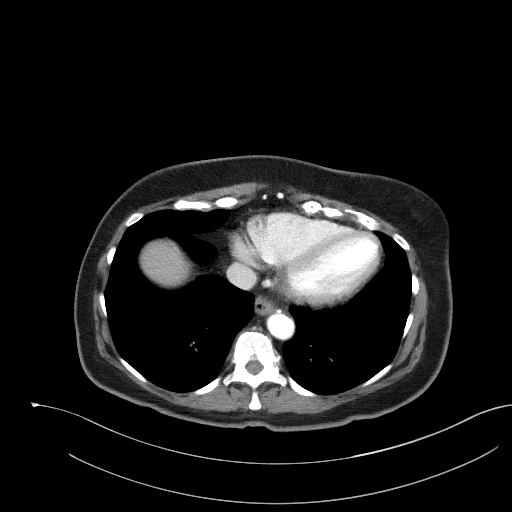

[Series 5: coronal st · coronal · 0.74mm/px · 3 of 88 slices shown]
[im 30/88  soft-tissue]
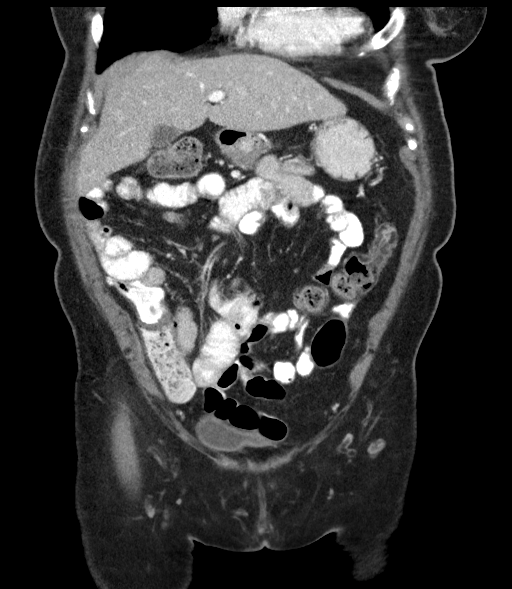
[im 39/88  soft-tissue]
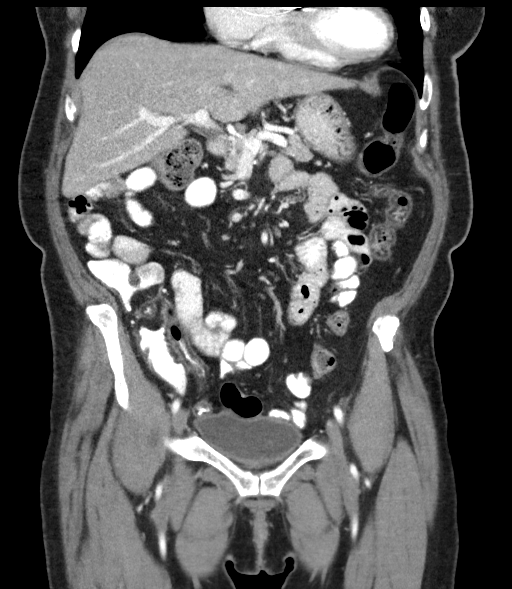
[im 49/88  soft-tissue]
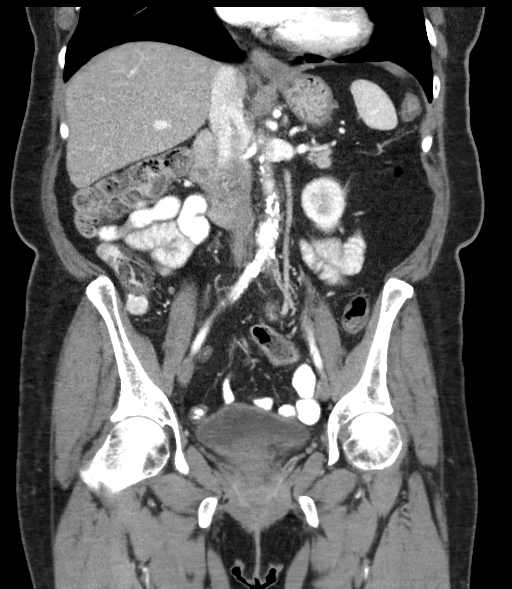

[16 of 46 positions shown; findings below may reference images not displayed]

FINDINGS: Lower chest: Pacer lead into the right ventricle. Atherosclerotic
calcification. Fatty Bochdalek's hernia on the left.

Hepatobiliary: Low-density of the liver that resolves on the delayed
phase. Tiny low-density in the left liver much too small for
accurate densitometry.No evidence of biliary obstruction or stone.

Pancreas: Unremarkable.

Spleen: Unremarkable.

Adrenals/Urinary Tract: Negative adrenals. No hydronephrosis or
stone. Unremarkable bladder.

Stomach/Bowel: The rectum is distended by desiccated stool to 6 cm.
No superimposed wall thickening/inflammation noted. Stool volume is
overall moderate throughout the colon. Somewhat thick appearance of
the appendix but based on reformats this is from submucosal fat
deposition, which is also seen in the terminal ileum and proximal
colon. No surrounding inflammatory changes.

Vascular/Lymphatic: No acute vascular abnormality. Extensive
atherosclerotic calcification of the aorta and iliacs. No mass or
adenopathy.

Reproductive:Hysterectomy.  Negative adnexae.

Other: No ascites or pneumoperitoneum.

Musculoskeletal: No acute abnormalities. Diffuse lumbar disc and
facet degeneration.
IMPRESSION: 1. No acute finding.  Negative for diverticulosis.
2. Moderate stool volume with rectal distention by desiccated stool.
3.  Aortic Atherosclerosis ([id number]-[id number]).

## 2019-02-18 DIAGNOSIS — Z1159 Encounter for screening for other viral diseases: Secondary | ICD-10-CM | POA: Diagnosis not present

## 2019-02-23 DIAGNOSIS — D509 Iron deficiency anemia, unspecified: Secondary | ICD-10-CM | POA: Diagnosis not present

## 2019-02-23 DIAGNOSIS — K2970 Gastritis, unspecified, without bleeding: Secondary | ICD-10-CM | POA: Diagnosis not present

## 2019-02-23 DIAGNOSIS — K922 Gastrointestinal hemorrhage, unspecified: Secondary | ICD-10-CM | POA: Diagnosis not present

## 2019-02-23 DIAGNOSIS — K449 Diaphragmatic hernia without obstruction or gangrene: Secondary | ICD-10-CM | POA: Diagnosis not present

## 2019-02-23 DIAGNOSIS — K2930 Chronic superficial gastritis without bleeding: Secondary | ICD-10-CM | POA: Diagnosis not present

## 2019-02-23 DIAGNOSIS — K222 Esophageal obstruction: Secondary | ICD-10-CM | POA: Diagnosis not present

## 2019-04-25 ENCOUNTER — Ambulatory Visit (INDEPENDENT_AMBULATORY_CARE_PROVIDER_SITE_OTHER): Payer: Medicare HMO | Admitting: *Deleted

## 2019-04-25 DIAGNOSIS — I4430 Unspecified atrioventricular block: Secondary | ICD-10-CM

## 2019-04-25 LAB — CUP PACEART REMOTE DEVICE CHECK
Battery Remaining Longevity: 106 mo
Battery Remaining Percentage: 95.5 %
Battery Voltage: 2.99 V
Brady Statistic AP VP Percent: 1 %
Brady Statistic AP VS Percent: 1.8 %
Brady Statistic AS VP Percent: 50 %
Brady Statistic AS VS Percent: 47 %
Brady Statistic RA Percent Paced: 2.6 %
Brady Statistic RV Percent Paced: 51 %
Date Time Interrogation Session: 20200928060033
Implantable Lead Implant Date: 20180404
Implantable Lead Implant Date: 20180404
Implantable Lead Location: 753859
Implantable Lead Location: 753860
Implantable Lead Model: 3830
Implantable Pulse Generator Implant Date: 20180404
Lead Channel Impedance Value: 510 Ohm
Lead Channel Impedance Value: 590 Ohm
Lead Channel Pacing Threshold Amplitude: 0.5 V
Lead Channel Pacing Threshold Amplitude: 0.5 V
Lead Channel Pacing Threshold Pulse Width: 0.4 ms
Lead Channel Pacing Threshold Pulse Width: 1 ms
Lead Channel Sensing Intrinsic Amplitude: 12 mV
Lead Channel Sensing Intrinsic Amplitude: 4.2 mV
Lead Channel Setting Pacing Amplitude: 2 V
Lead Channel Setting Pacing Amplitude: 2.5 V
Lead Channel Setting Pacing Pulse Width: 1 ms
Lead Channel Setting Sensing Sensitivity: 2 mV
Pulse Gen Model: 2272
Pulse Gen Serial Number: 8002284

## 2019-05-04 NOTE — Progress Notes (Signed)
Remote pacemaker transmission.   

## 2019-05-05 DIAGNOSIS — K2970 Gastritis, unspecified, without bleeding: Secondary | ICD-10-CM | POA: Diagnosis not present

## 2019-05-05 DIAGNOSIS — F5101 Primary insomnia: Secondary | ICD-10-CM | POA: Diagnosis not present

## 2019-05-05 DIAGNOSIS — E782 Mixed hyperlipidemia: Secondary | ICD-10-CM | POA: Diagnosis not present

## 2019-05-05 DIAGNOSIS — I10 Essential (primary) hypertension: Secondary | ICD-10-CM | POA: Diagnosis not present

## 2019-05-05 DIAGNOSIS — F17200 Nicotine dependence, unspecified, uncomplicated: Secondary | ICD-10-CM | POA: Diagnosis not present

## 2019-06-10 DIAGNOSIS — Z23 Encounter for immunization: Secondary | ICD-10-CM | POA: Diagnosis not present

## 2019-06-10 DIAGNOSIS — I10 Essential (primary) hypertension: Secondary | ICD-10-CM | POA: Diagnosis not present

## 2019-07-05 ENCOUNTER — Other Ambulatory Visit: Payer: Self-pay

## 2019-07-05 DIAGNOSIS — I10 Essential (primary) hypertension: Secondary | ICD-10-CM

## 2019-07-05 MED ORDER — CARVEDILOL 6.25 MG PO TABS: 6.2500 mg | ORAL_TABLET | Freq: Two times a day (BID) | ORAL | 3 refills | Status: DC

## 2019-07-25 ENCOUNTER — Ambulatory Visit (INDEPENDENT_AMBULATORY_CARE_PROVIDER_SITE_OTHER): Payer: Medicare HMO | Admitting: *Deleted

## 2019-07-25 ENCOUNTER — Telehealth: Payer: Self-pay | Admitting: Internal Medicine

## 2019-07-25 DIAGNOSIS — I4430 Unspecified atrioventricular block: Secondary | ICD-10-CM

## 2019-07-25 LAB — CUP PACEART REMOTE DEVICE CHECK
Battery Remaining Longevity: 106 mo
Battery Remaining Percentage: 95.5 %
Battery Voltage: 2.99 V
Brady Statistic AP VP Percent: 1.3 %
Brady Statistic AP VS Percent: 1.5 %
Brady Statistic AS VP Percent: 57 %
Brady Statistic AS VS Percent: 40 %
Brady Statistic RA Percent Paced: 2.7 %
Brady Statistic RV Percent Paced: 58 %
Date Time Interrogation Session: 20201228020014
Implantable Lead Implant Date: 20180404
Implantable Lead Implant Date: 20180404
Implantable Lead Location: 753859
Implantable Lead Location: 753860
Implantable Lead Model: 3830
Implantable Pulse Generator Implant Date: 20180404
Lead Channel Impedance Value: 580 Ohm
Lead Channel Impedance Value: 630 Ohm
Lead Channel Pacing Threshold Amplitude: 0.5 V
Lead Channel Pacing Threshold Amplitude: 0.5 V
Lead Channel Pacing Threshold Pulse Width: 0.4 ms
Lead Channel Pacing Threshold Pulse Width: 1 ms
Lead Channel Sensing Intrinsic Amplitude: 12 mV
Lead Channel Sensing Intrinsic Amplitude: 5 mV
Lead Channel Setting Pacing Amplitude: 2 V
Lead Channel Setting Pacing Amplitude: 2.5 V
Lead Channel Setting Pacing Pulse Width: 1 ms
Lead Channel Setting Sensing Sensitivity: 2 mV
Pulse Gen Model: 2272
Pulse Gen Serial Number: 8002284

## 2019-07-25 NOTE — Telephone Encounter (Signed)
Patient states that she is trying to stop smoking. She has been chewing the 4mg  nicotine gum. She wants to make sure this is okay for her to do since she does have a pacemaker.

## 2019-07-26 NOTE — Telephone Encounter (Signed)
Returned call to Pt.  Advised she could use nicotine gum to quit smoking.  Encouraged Pt to quit.

## 2019-07-26 NOTE — Progress Notes (Signed)
PPM remote 

## 2019-08-17 DIAGNOSIS — M25512 Pain in left shoulder: Secondary | ICD-10-CM | POA: Diagnosis not present

## 2019-08-17 DIAGNOSIS — M25511 Pain in right shoulder: Secondary | ICD-10-CM | POA: Diagnosis not present

## 2019-08-17 DIAGNOSIS — M19021 Primary osteoarthritis, right elbow: Secondary | ICD-10-CM | POA: Diagnosis not present

## 2019-08-20 ENCOUNTER — Ambulatory Visit: Payer: Medicare HMO | Attending: Internal Medicine

## 2019-08-20 DIAGNOSIS — Z23 Encounter for immunization: Secondary | ICD-10-CM | POA: Insufficient documentation

## 2019-09-14 DIAGNOSIS — M19021 Primary osteoarthritis, right elbow: Secondary | ICD-10-CM | POA: Diagnosis not present

## 2019-09-14 DIAGNOSIS — M67912 Unspecified disorder of synovium and tendon, left shoulder: Secondary | ICD-10-CM | POA: Diagnosis not present

## 2019-09-17 ENCOUNTER — Ambulatory Visit: Payer: Medicare HMO

## 2019-09-24 ENCOUNTER — Ambulatory Visit: Payer: Medicare HMO | Attending: Internal Medicine

## 2019-09-24 DIAGNOSIS — Z23 Encounter for immunization: Secondary | ICD-10-CM

## 2019-09-24 NOTE — Progress Notes (Signed)
   Covid-19 Vaccination Clinic  Name:  Denise Hodges    MRN: [id number] DOB: 04/02/1946  09/24/2019  Ms. [name] was observed post Covid-19 immunization for 15 minutes without incidence. She was provided with Vaccine Information Sheet and instruction to access the V-Safe system.   Ms. [name] was instructed to call 911 with any severe reactions post vaccine: Marland Kitchen Difficulty breathing  . Swelling of your face and throat  . A fast heartbeat  . A bad rash all over your body  . Dizziness and weakness    Immunizations Administered    Name Date Dose VIS Date Route   Moderna COVID-19 Vaccine 09/24/2019  9:19 AM 0.5 mL 06/28/2019 Intramuscular   Manufacturer: Moderna   Lot: [id number]   [id number]

## 2019-10-05 DIAGNOSIS — D649 Anemia, unspecified: Secondary | ICD-10-CM | POA: Diagnosis not present

## 2019-10-05 DIAGNOSIS — Z86010 Personal history of colonic polyps: Secondary | ICD-10-CM | POA: Diagnosis not present

## 2019-10-05 DIAGNOSIS — K5900 Constipation, unspecified: Secondary | ICD-10-CM | POA: Diagnosis not present

## 2019-10-07 DIAGNOSIS — D649 Anemia, unspecified: Secondary | ICD-10-CM | POA: Diagnosis not present

## 2019-10-17 DIAGNOSIS — I10 Essential (primary) hypertension: Secondary | ICD-10-CM | POA: Diagnosis not present

## 2019-10-17 DIAGNOSIS — E782 Mixed hyperlipidemia: Secondary | ICD-10-CM | POA: Diagnosis not present

## 2019-10-17 DIAGNOSIS — D649 Anemia, unspecified: Secondary | ICD-10-CM | POA: Diagnosis not present

## 2019-10-24 ENCOUNTER — Ambulatory Visit (INDEPENDENT_AMBULATORY_CARE_PROVIDER_SITE_OTHER): Payer: Medicare HMO | Admitting: *Deleted

## 2019-10-24 DIAGNOSIS — I4430 Unspecified atrioventricular block: Secondary | ICD-10-CM | POA: Diagnosis not present

## 2019-10-24 LAB — CUP PACEART REMOTE DEVICE CHECK
Battery Remaining Longevity: 104 mo
Battery Remaining Percentage: 95.5 %
Battery Voltage: 2.99 V
Brady Statistic AP VP Percent: 1.5 %
Brady Statistic AP VS Percent: 1.4 %
Brady Statistic AS VP Percent: 62 %
Brady Statistic AS VS Percent: 36 %
Brady Statistic RA Percent Paced: 2.8 %
Brady Statistic RV Percent Paced: 63 %
Date Time Interrogation Session: 20210329020013
Implantable Lead Implant Date: 20180404
Implantable Lead Implant Date: 20180404
Implantable Lead Location: 753859
Implantable Lead Location: 753860
Implantable Lead Model: 3830
Implantable Pulse Generator Implant Date: 20180404
Lead Channel Impedance Value: 540 Ohm
Lead Channel Impedance Value: 600 Ohm
Lead Channel Pacing Threshold Amplitude: 0.5 V
Lead Channel Pacing Threshold Amplitude: 0.5 V
Lead Channel Pacing Threshold Pulse Width: 0.4 ms
Lead Channel Pacing Threshold Pulse Width: 1 ms
Lead Channel Sensing Intrinsic Amplitude: 12 mV
Lead Channel Sensing Intrinsic Amplitude: 4.5 mV
Lead Channel Setting Pacing Amplitude: 2 V
Lead Channel Setting Pacing Amplitude: 2.5 V
Lead Channel Setting Pacing Pulse Width: 1 ms
Lead Channel Setting Sensing Sensitivity: 2 mV
Pulse Gen Model: 2272
Pulse Gen Serial Number: 8002284

## 2019-10-24 NOTE — Progress Notes (Signed)
PPM Remote  

## 2019-11-03 DIAGNOSIS — Z23 Encounter for immunization: Secondary | ICD-10-CM | POA: Diagnosis not present

## 2019-11-03 DIAGNOSIS — F17210 Nicotine dependence, cigarettes, uncomplicated: Secondary | ICD-10-CM | POA: Diagnosis not present

## 2019-11-03 DIAGNOSIS — F5101 Primary insomnia: Secondary | ICD-10-CM | POA: Diagnosis not present

## 2019-11-03 DIAGNOSIS — I10 Essential (primary) hypertension: Secondary | ICD-10-CM | POA: Diagnosis not present

## 2019-11-03 DIAGNOSIS — E782 Mixed hyperlipidemia: Secondary | ICD-10-CM | POA: Diagnosis not present

## 2019-11-03 DIAGNOSIS — Z1389 Encounter for screening for other disorder: Secondary | ICD-10-CM | POA: Diagnosis not present

## 2019-11-03 DIAGNOSIS — Z0000 Encounter for general adult medical examination without abnormal findings: Secondary | ICD-10-CM | POA: Diagnosis not present

## 2019-11-15 DIAGNOSIS — H17822 Peripheral opacity of cornea, left eye: Secondary | ICD-10-CM | POA: Diagnosis not present

## 2019-11-15 DIAGNOSIS — H2513 Age-related nuclear cataract, bilateral: Secondary | ICD-10-CM | POA: Diagnosis not present

## 2019-11-15 DIAGNOSIS — H04123 Dry eye syndrome of bilateral lacrimal glands: Secondary | ICD-10-CM | POA: Diagnosis not present

## 2020-01-10 DIAGNOSIS — D649 Anemia, unspecified: Secondary | ICD-10-CM | POA: Diagnosis not present

## 2020-01-23 ENCOUNTER — Ambulatory Visit (INDEPENDENT_AMBULATORY_CARE_PROVIDER_SITE_OTHER): Payer: Medicare HMO | Admitting: *Deleted

## 2020-01-23 DIAGNOSIS — I4430 Unspecified atrioventricular block: Secondary | ICD-10-CM

## 2020-01-24 LAB — CUP PACEART REMOTE DEVICE CHECK
Battery Remaining Longevity: 103 mo
Battery Remaining Percentage: 95.5 %
Battery Voltage: 2.99 V
Brady Statistic AP VP Percent: 1.5 %
Brady Statistic AP VS Percent: 1.2 %
Brady Statistic AS VP Percent: 66 %
Brady Statistic AS VS Percent: 32 %
Brady Statistic RA Percent Paced: 2.6 %
Brady Statistic RV Percent Paced: 67 %
Date Time Interrogation Session: 20210628020014
Implantable Lead Implant Date: 20180404
Implantable Lead Implant Date: 20180404
Implantable Lead Location: 753859
Implantable Lead Location: 753860
Implantable Lead Model: 3830
Implantable Pulse Generator Implant Date: 20180404
Lead Channel Impedance Value: 540 Ohm
Lead Channel Impedance Value: 590 Ohm
Lead Channel Pacing Threshold Amplitude: 0.5 V
Lead Channel Pacing Threshold Amplitude: 0.5 V
Lead Channel Pacing Threshold Pulse Width: 0.4 ms
Lead Channel Pacing Threshold Pulse Width: 1 ms
Lead Channel Sensing Intrinsic Amplitude: 12 mV
Lead Channel Sensing Intrinsic Amplitude: 4.8 mV
Lead Channel Setting Pacing Amplitude: 2 V
Lead Channel Setting Pacing Amplitude: 2.5 V
Lead Channel Setting Pacing Pulse Width: 1 ms
Lead Channel Setting Sensing Sensitivity: 2 mV
Pulse Gen Model: 2272
Pulse Gen Serial Number: 8002284

## 2020-01-25 NOTE — Progress Notes (Signed)
Remote pacemaker transmission.   

## 2020-02-17 DIAGNOSIS — M19021 Primary osteoarthritis, right elbow: Secondary | ICD-10-CM | POA: Diagnosis not present

## 2020-02-17 DIAGNOSIS — M19012 Primary osteoarthritis, left shoulder: Secondary | ICD-10-CM | POA: Diagnosis not present

## 2020-02-22 DIAGNOSIS — I10 Essential (primary) hypertension: Secondary | ICD-10-CM | POA: Diagnosis not present

## 2020-02-22 DIAGNOSIS — D509 Iron deficiency anemia, unspecified: Secondary | ICD-10-CM | POA: Diagnosis not present

## 2020-02-22 DIAGNOSIS — D649 Anemia, unspecified: Secondary | ICD-10-CM | POA: Diagnosis not present

## 2020-02-22 DIAGNOSIS — E782 Mixed hyperlipidemia: Secondary | ICD-10-CM | POA: Diagnosis not present

## 2020-03-04 ENCOUNTER — Ambulatory Visit (HOSPITAL_COMMUNITY)
Admission: EM | Admit: 2020-03-04 | Discharge: 2020-03-04 | Disposition: A | Discharge status: Home / Self Care | Payer: Medicare HMO | Attending: Emergency Medicine | Admitting: Emergency Medicine

## 2020-03-04 ENCOUNTER — Other Ambulatory Visit: Payer: Self-pay

## 2020-03-04 ENCOUNTER — Encounter (HOSPITAL_COMMUNITY): Payer: Self-pay

## 2020-03-04 DIAGNOSIS — H6121 Impacted cerumen, right ear: Secondary | ICD-10-CM

## 2020-03-04 DIAGNOSIS — R42 Dizziness and giddiness: Secondary | ICD-10-CM

## 2020-03-04 MED ORDER — MECLIZINE HCL 12.5 MG PO TABS: 12.5000 mg | ORAL_TABLET | Freq: Three times a day (TID) | ORAL | 0 refills | Status: DC | PRN

## 2020-03-04 NOTE — ED Provider Notes (Signed)
[facility]    CSN: [id number] Arrival date & time: 03/04/20  1412      History   Chief Complaint Chief Complaint  Patient presents with  . Dizziness    HPI Denise Hodges is a 74 y.o. female presenting with Dizziness and sinus drainage. Reports similar symptoms in the past wioth sinus infections or clogged ears. Denies slurred speech, numbness, weakness, tingling, headache, neck pain, fever, sore throat, cough.   HPI  Past Medical History:  Diagnosis Date  . Allergy   . Arthritis   . Bradycardia    pacemaker placed in April 2018  Medtronic  . GERD (gastroesophageal reflux disease)   . Hyperlipidemia   . Hypertension   . Seasonal allergies     Patient Active Problem List   Diagnosis Date Noted  . Pacemaker 02/10/2019  . Heart block, AV 10/29/2016  . Mobitz type 2 second degree atrioventricular block 10/29/2016  . Arthritis of shoulder 12/31/2012  . HTN (hypertension) 10/22/2012  . Pure hypercholesterolemia 10/22/2012    Past Surgical History:  Procedure Laterality Date  . ABDOMINAL HYSTERECTOMY  1995  . COLONOSCOPY  2013   brodie  . FOOT SURGERY  1985   bilateral for flat feet with pain  . PACEMAKER IMPLANT N/A 10/29/2016   Procedure: Pacemaker Implant;  Surgeon: Evans Lance, MD;  Location: [facility];  Service: Cardiovascular;  Laterality: N/A;  . POLYPECTOMY    . TONSILLECTOMY  as child  . TOTAL SHOULDER ARTHROPLASTY Right 12/30/2012   Procedure: RIGHT TOTAL SHOULDER ARTHROPLASTY;  Surgeon: Nita Sells, MD;  Location: [facility];  Service: Orthopedics;  Laterality: Right;  interscaline block  . WISDOM TOOTH EXTRACTION      OB History   No obstetric history on file.      Home Medications    Prior to Admission medications   Medication Sig Start Date End Date Taking? Authorizing Provider  aspirin 81 MG tablet Take 81 mg by mouth daily.    [provider]  azithromycin (ZITHROMAX) 250 MG tablet Take 1 tablet (250  mg total) by mouth daily. Take first 2 tablets together, then 1 every day until finished. 10/06/17   Vanessa Kick, MD  Calcium Carbonate-Vitamin D (CALCIUM 600 + D PO) Take 1 tablet by mouth daily.    [provider]  carvedilol (COREG) 6.25 MG tablet Take 1 tablet (6.25 mg total) by mouth 2 (two) times daily with a meal. 07/05/19   Evans Lance, MD  cetirizine (ZYRTEC) 10 MG tablet Take 10 mg by mouth daily.    [provider]  cholecalciferol (VITAMIN D-400) 400 UNITS TABS Take 400 Units by mouth daily.    [provider]  Cod Liver Oil 1000 MG CAPS Take 1 capsule by mouth daily.    [provider]  Enema Mineral Oil ENEM Place 1 application rectally daily. 01/22/18   Wurst, [place], PA-C  fenofibrate 160 MG tablet Take 1 tablet by mouth daily. 02/06/19   [provider]  Garlic 10 MG CAPS Take 1 tablet by mouth daily.    [provider]  guaiFENesin (MUCINEX) 600 MG 12 hr tablet Take 600 mg by mouth 2 (two) times daily.    [provider]  ibuprofen (ADVIL,MOTRIN) 200 MG tablet Take 400 mg by mouth every 6 (six) hours as needed for mild pain.    [provider]  irbesartan (AVAPRO) 150 MG tablet Take 1 tablet (150 mg total) by mouth daily.  02/10/19   Evans Lance, MD  meclizine (ANTIVERT) 12.5 MG tablet Take 1 tablet (12.5 mg total) by mouth 3 (three) times daily as needed for dizziness. 03/04/20   Domingo Dimes, PA-C  naproxen sodium (ALEVE) 220 MG tablet Take 220 mg by mouth.    [provider]  omeprazole (PRILOSEC) 20 MG capsule Take 20 mg by mouth daily.    [provider]  polyethylene glycol (MIRALAX / GLYCOLAX) packet Take 17 g by mouth daily. 01/22/18   Wurst, [place], PA-C  vitamin C (ASCORBIC ACID) 500 MG tablet Take 500 mg by mouth daily.    [provider]    Family History Family History  Problem Relation Age of Onset  . Dementia Mother   . Colon cancer Maternal Aunt 93  .  Colon cancer Cousin 43       maternal  . Rectal cancer Neg Hx   . Stomach cancer Neg Hx   . Colon polyps Neg Hx   . Esophageal cancer Neg Hx     Social History Social History   Tobacco Use  . Smoking status: Current Every Day Smoker    Packs/day: 0.25    Types: Cigarettes  . Smokeless tobacco: Never Used  Vaping Use  . Vaping Use: Never used  Substance Use Topics  . Alcohol use: No  . Drug use: No     Allergies   Macrobid [nitrofurantoin macrocrystal]   Review of Systems Review of Systems  HENT: Positive for hearing loss and sinus pressure. Negative for postnasal drip and rhinorrhea.   Respiratory: Negative for cough, chest tightness and shortness of breath.   Cardiovascular: Negative for chest pain.  Gastrointestinal: Negative for abdominal pain, nausea and vomiting.  Neurological: Positive for dizziness. Negative for facial asymmetry, speech difficulty, weakness, light-headedness, numbness and headaches.  Hematological: Does not bruise/bleed easily.     Physical Exam Triage Vital Signs ED Triage Vitals  Enc Vitals Group     BP 03/04/20 1430 (!) 144/63     Pulse Rate 03/04/20 1430 65     Resp 03/04/20 1430 16     Temp 03/04/20 1430 98.2 F (36.8 C)     Temp src --      SpO2 03/04/20 1430 100 %     Weight --      Height --      Head Circumference --      Peak Flow --      Pain Score 03/04/20 1433 0     Pain Loc --      Pain Edu? --      Excl. in [place]? --    No data found.  Updated Vital Signs BP (!) 144/63 (BP Location: Right Arm)   Pulse 65   Temp 98.2 F (36.8 C)   Resp 16   SpO2 100%   Visual Acuity Right Eye Distance:   Left Eye Distance:   Bilateral Distance:    Right Eye Near:   Left Eye Near:    Bilateral Near:     Physical Exam Vitals reviewed.  Constitutional:      General: She is not in acute distress.    Appearance: Normal appearance. She is normal weight. She is not ill-appearing.  HENT:     Right Ear: There is impacted  cerumen.     Left Ear: Tympanic membrane, ear canal and external ear normal.     Nose: Nose normal.     Mouth/Throat:     Mouth:  Mucous membranes are moist.     Pharynx: No oropharyngeal exudate or posterior oropharyngeal erythema.  Eyes:     Extraocular Movements: Extraocular movements intact.     Conjunctiva/sclera: Conjunctivae normal.     Pupils: Pupils are equal, round, and reactive to light.  Neurological:     General: No focal deficit present.     Mental Status: She is alert and oriented to person, place, and time. Mental status is at baseline.     Cranial Nerves: No cranial nerve deficit.     Sensory: No sensory deficit.     Motor: No weakness.     Coordination: Coordination normal.     Gait: Gait normal.  Psychiatric:        Mood and Affect: Mood normal.        Behavior: Behavior normal.        Thought Content: Thought content normal.        Initial Impression / Assessment and Plan / UC Course  I have reviewed the triage vital signs and the nursing notes.  Pertinent labs & imaging results that were available during my care of the patient were reviewed by me and considered in my medical decision making (see chart for details).     Cerumen impaction and dizziness Unable to clear cerumen impaction. Some cerumen was removed and she reports some improvement in symptoms. She will follow-up with her PCP regarding removal of remaining cerumen. I prescribed Meclizine to help with dizziness until then. Advised her to go to the ER if symptoms worsen  Final Clinical Impressions(s) / UC Diagnoses   Final diagnoses:  Dizziness  Impacted cerumen of right ear   Discharge Instructions   None    ED Prescriptions    Medication Sig Dispense Auth. Provider   meclizine (ANTIVERT) 12.5 MG tablet Take 1 tablet (12.5 mg total) by mouth 3 (three) times daily as needed for dizziness. 30 tablet Domingo Dimes, [place]     PDMP not reviewed this encounter.   Domingo Dimes,  PA-C 03/04/20 1737

## 2020-03-04 NOTE — ED Triage Notes (Signed)
Pt presents to UC for dizziness, nasal congestion, and sinus pressure. Pt states she has been feeling dizzy for appox 1 week, but sinus symptoms started a few days ago. Pt denies fever, chill, n/v/d, body aches, sore throat, loss of taste or smell.   Pt also complaining of possibly r/t walking into glass door. Pt states she did not see door and walked into it, bumping her head. Pt denies LOC.

## 2020-03-07 DIAGNOSIS — J0140 Acute pansinusitis, unspecified: Secondary | ICD-10-CM | POA: Diagnosis not present

## 2020-03-29 DIAGNOSIS — R42 Dizziness and giddiness: Secondary | ICD-10-CM | POA: Diagnosis not present

## 2020-03-29 DIAGNOSIS — I10 Essential (primary) hypertension: Secondary | ICD-10-CM | POA: Diagnosis not present

## 2020-03-29 DIAGNOSIS — J0140 Acute pansinusitis, unspecified: Secondary | ICD-10-CM | POA: Diagnosis not present

## 2020-04-10 DIAGNOSIS — D509 Iron deficiency anemia, unspecified: Secondary | ICD-10-CM | POA: Diagnosis not present

## 2020-04-23 ENCOUNTER — Ambulatory Visit (INDEPENDENT_AMBULATORY_CARE_PROVIDER_SITE_OTHER): Payer: Medicare HMO | Admitting: Emergency Medicine

## 2020-04-23 DIAGNOSIS — I4430 Unspecified atrioventricular block: Secondary | ICD-10-CM

## 2020-04-24 LAB — CUP PACEART REMOTE DEVICE CHECK
Battery Remaining Longevity: 104 mo
Battery Remaining Percentage: 95.5 %
Battery Voltage: 2.99 V
Brady Statistic AP VP Percent: 2.2 %
Brady Statistic AP VS Percent: 1.1 %
Brady Statistic AS VP Percent: 68 %
Brady Statistic AS VS Percent: 29 %
Brady Statistic RA Percent Paced: 3.2 %
Brady Statistic RV Percent Paced: 70 %
Date Time Interrogation Session: 20210927020012
Implantable Lead Implant Date: 20180404
Implantable Lead Implant Date: 20180404
Implantable Lead Location: 753859
Implantable Lead Location: 753860
Implantable Lead Model: 3830
Implantable Pulse Generator Implant Date: 20180404
Lead Channel Impedance Value: 580 Ohm
Lead Channel Impedance Value: 610 Ohm
Lead Channel Pacing Threshold Amplitude: 0.5 V
Lead Channel Pacing Threshold Amplitude: 0.5 V
Lead Channel Pacing Threshold Pulse Width: 0.4 ms
Lead Channel Pacing Threshold Pulse Width: 1 ms
Lead Channel Sensing Intrinsic Amplitude: 12 mV
Lead Channel Sensing Intrinsic Amplitude: 5 mV
Lead Channel Setting Pacing Amplitude: 2 V
Lead Channel Setting Pacing Amplitude: 2.5 V
Lead Channel Setting Pacing Pulse Width: 1 ms
Lead Channel Setting Sensing Sensitivity: 2 mV
Pulse Gen Model: 2272
Pulse Gen Serial Number: 8002284

## 2020-04-25 DIAGNOSIS — I10 Essential (primary) hypertension: Secondary | ICD-10-CM | POA: Diagnosis not present

## 2020-04-25 DIAGNOSIS — D649 Anemia, unspecified: Secondary | ICD-10-CM | POA: Diagnosis not present

## 2020-04-25 DIAGNOSIS — D509 Iron deficiency anemia, unspecified: Secondary | ICD-10-CM | POA: Diagnosis not present

## 2020-04-25 DIAGNOSIS — E782 Mixed hyperlipidemia: Secondary | ICD-10-CM | POA: Diagnosis not present

## 2020-04-26 NOTE — Progress Notes (Signed)
Remote pacemaker transmission.   

## 2020-05-04 DIAGNOSIS — M19012 Primary osteoarthritis, left shoulder: Secondary | ICD-10-CM | POA: Diagnosis not present

## 2020-05-04 DIAGNOSIS — M79642 Pain in left hand: Secondary | ICD-10-CM | POA: Diagnosis not present

## 2020-05-04 DIAGNOSIS — M19021 Primary osteoarthritis, right elbow: Secondary | ICD-10-CM | POA: Diagnosis not present

## 2020-05-07 DIAGNOSIS — F17200 Nicotine dependence, unspecified, uncomplicated: Secondary | ICD-10-CM | POA: Diagnosis not present

## 2020-05-07 DIAGNOSIS — J069 Acute upper respiratory infection, unspecified: Secondary | ICD-10-CM | POA: Diagnosis not present

## 2020-05-07 DIAGNOSIS — E782 Mixed hyperlipidemia: Secondary | ICD-10-CM | POA: Diagnosis not present

## 2020-05-07 DIAGNOSIS — Z950 Presence of cardiac pacemaker: Secondary | ICD-10-CM | POA: Diagnosis not present

## 2020-05-07 DIAGNOSIS — I10 Essential (primary) hypertension: Secondary | ICD-10-CM | POA: Diagnosis not present

## 2020-05-07 DIAGNOSIS — F5101 Primary insomnia: Secondary | ICD-10-CM | POA: Diagnosis not present

## 2020-05-07 DIAGNOSIS — Z23 Encounter for immunization: Secondary | ICD-10-CM | POA: Diagnosis not present

## 2020-06-19 DIAGNOSIS — I10 Essential (primary) hypertension: Secondary | ICD-10-CM | POA: Diagnosis not present

## 2020-06-26 DIAGNOSIS — D649 Anemia, unspecified: Secondary | ICD-10-CM | POA: Diagnosis not present

## 2020-06-26 DIAGNOSIS — I10 Essential (primary) hypertension: Secondary | ICD-10-CM | POA: Diagnosis not present

## 2020-06-26 DIAGNOSIS — G4700 Insomnia, unspecified: Secondary | ICD-10-CM | POA: Diagnosis not present

## 2020-06-26 DIAGNOSIS — D509 Iron deficiency anemia, unspecified: Secondary | ICD-10-CM | POA: Diagnosis not present

## 2020-06-26 DIAGNOSIS — E782 Mixed hyperlipidemia: Secondary | ICD-10-CM | POA: Diagnosis not present

## 2020-06-27 DIAGNOSIS — M19012 Primary osteoarthritis, left shoulder: Secondary | ICD-10-CM | POA: Diagnosis not present

## 2020-06-27 DIAGNOSIS — M79642 Pain in left hand: Secondary | ICD-10-CM | POA: Diagnosis not present

## 2020-06-29 DIAGNOSIS — Z1231 Encounter for screening mammogram for malignant neoplasm of breast: Secondary | ICD-10-CM | POA: Diagnosis not present

## 2020-07-05 ENCOUNTER — Other Ambulatory Visit: Payer: Self-pay

## 2020-07-05 ENCOUNTER — Encounter (HOSPITAL_COMMUNITY): Payer: Self-pay

## 2020-07-05 ENCOUNTER — Ambulatory Visit (HOSPITAL_COMMUNITY)
Admission: EM | Admit: 2020-07-05 | Discharge: 2020-07-05 | Disposition: A | Discharge status: Home / Self Care | Payer: Medicare HMO | Attending: Family Medicine | Admitting: Family Medicine

## 2020-07-05 DIAGNOSIS — N3001 Acute cystitis with hematuria: Secondary | ICD-10-CM | POA: Diagnosis not present

## 2020-07-05 LAB — POCT URINALYSIS DIPSTICK, ED / UC
Bilirubin Urine: NEGATIVE
Glucose, UA: NEGATIVE mg/dL
Ketones, ur: NEGATIVE mg/dL
Nitrite: POSITIVE — AB
Protein, ur: NEGATIVE mg/dL
Specific Gravity, Urine: 1.025 (ref 1.005–1.030)
Urobilinogen, UA: 0.2 mg/dL (ref 0.0–1.0)
pH: 5.5 (ref 5.0–8.0)

## 2020-07-05 MED ORDER — SULFAMETHOXAZOLE-TRIMETHOPRIM 800-160 MG PO TABS: 1.0000 | ORAL_TABLET | Freq: Two times a day (BID) | ORAL | 0 refills | Status: AC

## 2020-07-05 NOTE — Discharge Instructions (Signed)
Take your antibiotic once in the morning and once in the evening. Continue to drink plenty of water. Follow-up with your primary care provider to discuss your diuretic. Follow-up with [place] or your PCP if your urinary symptoms fail to improve. We'll follow-up with the result of your urine culture.

## 2020-07-05 NOTE — ED Provider Notes (Signed)
[place]    CSN: [id number] Arrival date & time: 07/05/20  1497      History   Chief Complaint Chief Complaint  Patient presents with  . Dysuria    HPI Denise Hodges is a 74 y.o. female presenting with urinary urgency and dysuria x2 days. She states that she started a diuretic one month ago. 1 week ago, she started developing urinary urgency and dysuria. Also endorses lower abd pressure. She was concerned that diuretic was contributing to symptoms, and so stopped her diuretic. Symptoms improved, but have continued to persist. Pt denies fevers/chills, hematuria, back pain, n/v/d. She's had a UTI in the past, but not for 20 years.   HPI  Past Medical History:  Diagnosis Date  . Allergy   . Arthritis   . Bradycardia    pacemaker placed in April 2018  [employer]tr[employer]  . GERD (gastroesophageal reflux disease)   . Hyperlipidemia   . Hypertension   . Seasonal allergies     Patient Active Problem List   Diagnosis Date Noted  . Pacemaker 02/10/2019  . Heart block, AV 10/29/2016  . Mobitz type 2 second degree atrioventricular block 10/29/2016  . Arthritis of shoulder 12/31/2012  . HTN (hypertension) 10/22/2012  . Pure hypercholesterolemia 10/22/2012    Past Surgical History:  Procedure Laterality Date  . ABDOMINAL HYSTERECTOMY  1995  . COLONOSCOPY  2013   [facility]  . FOOT SURGERY  1985   bilateral for flat feet with pain  . PACEMAKER IMPLANT N/A 10/29/2016   Procedure: Pacemaker Implant;  Surgeon: Evans Lance, MD;  Location: [facility];  Service: Cardiovascular;  Laterality: N/A;  . POLYPECTOMY    . TONSILLECTOMY  as child  . TOTAL SHOULDER ARTHROPLASTY Right 12/30/2012   Procedure: RIGHT TOTAL SHOULDER ARTHROPLASTY;  Surgeon: Nita Sells, MD;  Location: [facility];  Service: Orthopedics;  Laterality: Right;  interscaline block  . WISDOM TOOTH EXTRACTION      OB History   No obstetric history on file.      Home Medications    Prior to  Admission medications   Medication Sig Start Date End Date Taking? Authorizing Provider  aspirin 81 MG tablet Take 81 mg by mouth daily.    [provider]  Calcium Carbonate-Vitamin D (CALCIUM 600 + D PO) Take 1 tablet by mouth daily.    [provider]  carvedilol (COREG) 6.25 MG tablet Take 1 tablet (6.25 mg total) by mouth 2 (two) times daily with a meal. 07/05/19   Evans Lance, MD  cetirizine (ZYRTEC) 10 MG tablet Take 10 mg by mouth daily.    [provider]  cholecalciferol (VITAMIN D-400) 400 UNITS TABS Take 400 Units by mouth daily.    [provider]  Cod Liver Oil 1000 MG CAPS Take 1 capsule by mouth daily.    [provider]  Enema Mineral Oil ENEM Place 1 application rectally daily. 01/22/18   Wurst, [place], PA-C  fenofibrate 160 MG tablet Take 1 tablet by mouth daily. 02/06/19   [provider]  Garlic 10 MG CAPS Take 1 tablet by mouth daily.    [provider]  guaiFENesin (MUCINEX) 600 MG 12 hr tablet Take 600 mg by mouth 2 (two) times daily.    [provider]  ibuprofen (ADVIL,MOTRIN) 200 MG tablet Take 400 mg by mouth every 6 (six) hours as needed for mild pain.    [provider]  irbesartan (AVAPRO) 150 MG  tablet Take 1 tablet (150 mg total) by mouth daily. 02/10/19   Evans Lance, MD  meclizine (ANTIVERT) 12.5 MG tablet Take 1 tablet (12.5 mg total) by mouth 3 (three) times daily as needed for dizziness. 03/04/20   Domingo Dimes, PA-C  naproxen sodium (ALEVE) 220 MG tablet Take 220 mg by mouth.    [provider]  omeprazole (PRILOSEC) 20 MG capsule Take 20 mg by mouth daily.    [provider]  polyethylene glycol (MIRALAX / GLYCOLAX) packet Take 17 g by mouth daily. 01/22/18   Wurst, [place], PA-C  sulfamethoxazole-trimethoprim (BACTRIM DS) 800-160 MG tablet Take 1 tablet by mouth 2 (two) times daily for 7 days. 07/05/20 07/12/20  Hazel Sams, PA-C  vitamin C  (ASCORBIC ACID) 500 MG tablet Take 500 mg by mouth daily.    [provider]    Family History Family History  Problem Relation Age of Onset  . Dementia Mother   . Colon cancer Maternal Aunt 62  . Colon cancer Cousin 29       maternal  . Rectal cancer Neg Hx   . Stomach cancer Neg Hx   . Colon polyps Neg Hx   . Esophageal cancer Neg Hx     Social History Social History   Tobacco Use  . Smoking status: Current Every Day Smoker    Packs/day: 0.25    Types: Cigarettes  . Smokeless tobacco: Never Used  Vaping Use  . Vaping Use: Never used  Substance Use Topics  . Alcohol use: No  . Drug use: No     Allergies   Macrobid [nitrofurantoin macrocrystal]   Review of Systems Review of Systems  Constitutional: Negative for chills and fever.  Gastrointestinal: Positive for abdominal pain. Negative for nausea and vomiting.  Genitourinary: Positive for dysuria and frequency. Negative for decreased urine volume, flank pain, pelvic pain and urgency.  All other systems reviewed and are negative.    Physical Exam Triage Vital Signs ED Triage Vitals  Enc Vitals Group     BP 07/05/20 1033 129/67     Pulse Rate 07/05/20 1033 71     Resp 07/05/20 1033 17     Temp 07/05/20 1033 97.8 F (36.6 C)     Temp Source 07/05/20 1033 Oral     SpO2 07/05/20 1033 100 %     Weight --      Height --      Head Circumference --      Peak Flow --      Pain Score 07/05/20 1030 4     Pain Loc --      Pain Edu? --      Excl. in [place]? --    No data found.  Updated Vital Signs BP 129/67 (BP Location: Right Arm)   Pulse 71   Temp 97.8 F (36.6 C) (Oral)   Resp 17   SpO2 100%   Visual Acuity Right Eye Distance:   Left Eye Distance:   Bilateral Distance:    Right Eye Near:   Left Eye Near:    Bilateral Near:     Physical Exam Vitals reviewed.  Constitutional:      Appearance: Normal appearance. She is normal weight.  HENT:     Head: Normocephalic and atraumatic.   Cardiovascular:     Rate and Rhythm: Normal rate and regular rhythm.     Heart sounds: Normal heart sounds.  Pulmonary:     Effort: Pulmonary effort is  normal.     Breath sounds: Normal breath sounds.  Abdominal:     General: Abdomen is flat. Bowel sounds are normal.     Palpations: Abdomen is soft.     Tenderness: There is abdominal tenderness (suprapubic tenderness). There is no right CVA tenderness, left CVA tenderness, guarding or rebound.  Neurological:     Mental Status: She is alert.      UC Treatments / Results  Labs (all labs ordered are listed, but only abnormal results are displayed) Labs Reviewed  POCT URINALYSIS DIPSTICK, ED / UC - Abnormal; Notable for the following components:      Result Value   Hgb urine dipstick SMALL (*)    Nitrite POSITIVE (*)    Leukocytes,Ua TRACE (*)    All other components within normal limits  URINE CULTURE    EKG   Radiology No results found.  Procedures Procedures (including critical care time)  Medications Ordered in UC Medications - No data to display  Initial Impression / Assessment and Plan / UC Course  I have reviewed the triage vital signs and the nursing notes.  Pertinent labs & imaging results that were available during my care of the patient were reviewed by me and considered in my medical decision making (see chart for details).     UA consistent with acute cystitis. Pt is allergic to [place]; Bactrim sent as below. Creatinine was in normal range 2 years ago, and pt reports it was also normal at her PCP 1 month ago. Also encouraged good hydration. She will f/u with PCP if symptoms worsen or persist. Return precautions- worsening of abd pain, new onset of back pain/fevers/etc. Pt verbalizes understanding and agreement with treatment plan. Culture sent.   Final Clinical Impressions(s) / UC Diagnoses   Final diagnoses:  Acute cystitis with hematuria     Discharge Instructions     Take your antibiotic once  in the morning and once in the evening. Continue to drink plenty of water. Follow-up with your primary care provider to discuss your diuretic. Follow-up with [place] or your PCP if your urinary symptoms fail to improve. We'll follow-up with the result of your urine culture.    ED Prescriptions    Medication Sig Dispense Auth. Provider   sulfamethoxazole-trimethoprim (BACTRIM DS) 800-160 MG tablet Take 1 tablet by mouth 2 (two) times daily for 7 days. 14 tablet Hazel Sams, PA-C     PDMP not reviewed this encounter.   Hazel Sams, PA-C 07/05/20 1108

## 2020-07-05 NOTE — ED Triage Notes (Signed)
Pt presents with urinary urgency and pain with urination X 2 days.

## 2020-07-07 LAB — URINE CULTURE: Culture: >=100000 — AB

## 2020-07-23 ENCOUNTER — Ambulatory Visit (INDEPENDENT_AMBULATORY_CARE_PROVIDER_SITE_OTHER): Payer: Medicare HMO

## 2020-07-23 DIAGNOSIS — I4430 Unspecified atrioventricular block: Secondary | ICD-10-CM | POA: Diagnosis not present

## 2020-07-24 LAB — CUP PACEART REMOTE DEVICE CHECK
Battery Remaining Longevity: 103 mo
Battery Remaining Percentage: 95.5 %
Battery Voltage: 2.98 V
Brady Statistic AP VP Percent: 2.4 %
Brady Statistic AP VS Percent: 1.1 %
Brady Statistic AS VP Percent: 70 %
Brady Statistic AS VS Percent: 26 %
Brady Statistic RA Percent Paced: 3.5 %
Brady Statistic RV Percent Paced: 73 %
Date Time Interrogation Session: 20211227020015
Implantable Lead Implant Date: 20180404
Implantable Lead Implant Date: 20180404
Implantable Lead Location: 753859
Implantable Lead Location: 753860
Implantable Lead Model: 3830
Implantable Pulse Generator Implant Date: 20180404
Lead Channel Impedance Value: 560 Ohm
Lead Channel Impedance Value: 600 Ohm
Lead Channel Pacing Threshold Amplitude: 0.5 V
Lead Channel Pacing Threshold Amplitude: 0.5 V
Lead Channel Pacing Threshold Pulse Width: 0.4 ms
Lead Channel Pacing Threshold Pulse Width: 1 ms
Lead Channel Sensing Intrinsic Amplitude: 12 mV
Lead Channel Sensing Intrinsic Amplitude: 5 mV
Lead Channel Setting Pacing Amplitude: 2 V
Lead Channel Setting Pacing Amplitude: 2.5 V
Lead Channel Setting Pacing Pulse Width: 1 ms
Lead Channel Setting Sensing Sensitivity: 2 mV
Pulse Gen Model: 2272
Pulse Gen Serial Number: 8002284

## 2020-08-02 NOTE — Progress Notes (Signed)
Remote pacemaker transmission.   

## 2020-08-20 DIAGNOSIS — M19012 Primary osteoarthritis, left shoulder: Secondary | ICD-10-CM | POA: Diagnosis not present

## 2020-08-20 DIAGNOSIS — M1812 Unilateral primary osteoarthritis of first carpometacarpal joint, left hand: Secondary | ICD-10-CM | POA: Diagnosis not present

## 2020-08-26 ENCOUNTER — Ambulatory Visit (HOSPITAL_COMMUNITY)
Admission: EM | Admit: 2020-08-26 | Discharge: 2020-08-26 | Disposition: A | Discharge status: Home / Self Care | Payer: BC Managed Care – PPO | Attending: Student | Admitting: Student

## 2020-08-26 ENCOUNTER — Encounter (HOSPITAL_COMMUNITY): Payer: Self-pay

## 2020-08-26 ENCOUNTER — Other Ambulatory Visit: Payer: Self-pay

## 2020-08-26 DIAGNOSIS — K449 Diaphragmatic hernia without obstruction or gangrene: Secondary | ICD-10-CM

## 2020-08-26 DIAGNOSIS — Z950 Presence of cardiac pacemaker: Secondary | ICD-10-CM

## 2020-08-26 DIAGNOSIS — Z79899 Other long term (current) drug therapy: Secondary | ICD-10-CM

## 2020-08-26 DIAGNOSIS — Z87440 Personal history of urinary (tract) infections: Secondary | ICD-10-CM

## 2020-08-26 DIAGNOSIS — K219 Gastro-esophageal reflux disease without esophagitis: Secondary | ICD-10-CM | POA: Diagnosis not present

## 2020-08-26 DIAGNOSIS — I441 Atrioventricular block, second degree: Secondary | ICD-10-CM | POA: Diagnosis not present

## 2020-08-26 DIAGNOSIS — J301 Allergic rhinitis due to pollen: Secondary | ICD-10-CM | POA: Diagnosis not present

## 2020-08-26 MED ORDER — PANTOPRAZOLE SODIUM 20 MG PO TBEC: 20.0000 mg | DELAYED_RELEASE_TABLET | Freq: Every day | ORAL | 0 refills | Status: DC

## 2020-08-26 MED ORDER — LIDOCAINE VISCOUS HCL 2 % MT SOLN
OROMUCOSAL | Status: AC
  Filled 2020-08-26: qty 15

## 2020-08-26 MED ORDER — ALUM & MAG HYDROXIDE-SIMETH 200-200-20 MG/5ML PO SUSP
30.0000 mL | Freq: Once | ORAL | Status: AC
  Administered 2020-08-26: 30 mL via ORAL

## 2020-08-26 MED ORDER — ALUM & MAG HYDROXIDE-SIMETH 200-200-20 MG/5ML PO SUSP
ORAL | Status: AC
  Filled 2020-08-26: qty 30

## 2020-08-26 NOTE — Discharge Instructions (Addendum)
-  Take the protonix, one pill daily  -You can also take Pepcid as needed -Continue with the Zyrtec. Try stopping the flonase and see if that helps with your nasal symptoms -Follow-up with primary care provider if symptoms worsen/persist -Head straight to ED if left-sided chest pain, ripping chest pain, pain down left arm, worst headache of life, vision changes, etc.

## 2020-08-26 NOTE — ED Triage Notes (Signed)
Pt c/o possible sinus infection, she states she has a hernia that is causing her to have acid reflux. Pt states it has been going since Wednesday. Pt states she has been having headaches.

## 2020-08-26 NOTE — ED Provider Notes (Signed)
[place]    CSN: [id number] Arrival date & time: 08/26/20  1005      History   Chief Complaint Chief Complaint  Patient presents with  . Gastroesophageal Reflux    HPI Denise Hodges is a 75 y.o. female presenting for multiple complaints. History allergies taking zyrtec/flonase, arthritis, bradycardia/Mobitz II with pacemaker in place, hypertension taking carvedilol irbesartan, hiatal hernia followed by GI, GERD.  -Pt presenting with complaint about her last visit with [place]. She states that she was prescribed a medication that she was told would stop her heart if she takes it. Upon chart review, it appears that I treated patient 06/2020 for UTI with bactrim (due to Melissa allergy); she had taken bactrim before without issue and her kidney function was wnl. Patient states that the pharmacist prescribed her bactrim and she took it as prescribed. Patient had no issues with this medication. I discussed that if the pharmacist had an issue with my treatment plan, they would have called me and not prescribed pt the medication. Patient verbalizes understanding. I suspect there was a miscommunication between patient/pt's husband/pharmacist.  -Hypertension- taking carvedilol and irbesartan as directed by PCP. Denies chest pain, vision changes, severe headaches, shortness of breath, urinary symptoms. Followed by PCP for this. -Allergic rhinitis- currently exacerbated despite flonase and zyrtec. Pt endorses nasal congestion and pressure behind nose/eyes.  -GERD- patient notes long history of GERD, poorly controlled on current regimen of no medications. Has taken protonix for this in the past. States she's followed by GI for her GERD and hiatal hernia. Has not taken anything for this today and has not eaten today. Describes sensation of gnawing in stomach and acid that intermittently burns up her esophagus. Denies n/v/d.   HPI  Past Medical History:  Diagnosis Date  . Allergy   .  Arthritis   . Bradycardia    pacemaker placed in April 2018  [employer]  . GERD (gastroesophageal reflux disease)   . Hyperlipidemia   . Hypertension   . Seasonal allergies     Patient Active Problem List   Diagnosis Date Noted  . Pacemaker 02/10/2019  . Heart block, AV 10/29/2016  . Mobitz type 2 second degree atrioventricular block 10/29/2016  . Arthritis of shoulder 12/31/2012  . HTN (hypertension) 10/22/2012  . Pure hypercholesterolemia 10/22/2012    Past Surgical History:  Procedure Laterality Date  . ABDOMINAL HYSTERECTOMY  1995  . COLONOSCOPY  2013   brodie  . FOOT SURGERY  1985   bilateral for flat feet with pain  . PACEMAKER IMPLANT N/A 10/29/2016   Procedure: Pacemaker Implant;  Surgeon: Evans Lance, MD;  Location: [facility];  Service: Cardiovascular;  Laterality: N/A;  . POLYPECTOMY    . TONSILLECTOMY  as child  . TOTAL SHOULDER ARTHROPLASTY Right 12/30/2012   Procedure: RIGHT TOTAL SHOULDER ARTHROPLASTY;  Surgeon: Nita Sells, MD;  Location: [facility];  Service: Orthopedics;  Laterality: Right;  interscaline block  . WISDOM TOOTH EXTRACTION      OB History   No obstetric history on file.      Home Medications    Prior to Admission medications   Medication Sig Start Date End Date Taking? Authorizing Provider  carvedilol (COREG) 6.25 MG tablet Take 1 tablet (6.25 mg total) by mouth 2 (two) times daily with a meal. 07/05/19  Yes Evans Lance, MD  pantoprazole (PROTONIX) 20 MG tablet Take 1 tablet (20 mg total) by mouth daily. 08/26/20  Yes Marin Roberts  E, PA-C  aspirin 81 MG tablet Take 81 mg by mouth daily.    [provider]  Calcium Carbonate-Vitamin D (CALCIUM 600 + D PO) Take 1 tablet by mouth daily.    [provider]  cetirizine (ZYRTEC) 10 MG tablet Take 10 mg by mouth daily.    [provider]  cholecalciferol (VITAMIN D-400) 400 UNITS TABS Take 400 Units by mouth daily.    [provider]  Cod  Liver Oil 1000 MG CAPS Take 1 capsule by mouth daily.    [provider]  Enema Mineral Oil ENEM Place 1 application rectally daily. 01/22/18   Wurst, [place], PA-C  fenofibrate 160 MG tablet Take 1 tablet by mouth daily. 02/06/19   [provider]  Garlic 10 MG CAPS Take 1 tablet by mouth daily.    [provider]  guaiFENesin (MUCINEX) 600 MG 12 hr tablet Take 600 mg by mouth 2 (two) times daily.    [provider]  ibuprofen (ADVIL,MOTRIN) 200 MG tablet Take 400 mg by mouth every 6 (six) hours as needed for mild pain.    [provider]  irbesartan (AVAPRO) 150 MG tablet Take 1 tablet (150 mg total) by mouth daily. 02/10/19   Evans Lance, MD  meclizine (ANTIVERT) 12.5 MG tablet Take 1 tablet (12.5 mg total) by mouth 3 (three) times daily as needed for dizziness. 03/04/20   Domingo Dimes, PA-C  naproxen sodium (ALEVE) 220 MG tablet Take 220 mg by mouth.    [provider]  polyethylene glycol (MIRALAX / GLYCOLAX) packet Take 17 g by mouth daily. 01/22/18   Wurst, [place], PA-C  vitamin C (ASCORBIC ACID) 500 MG tablet Take 500 mg by mouth daily.    [provider]  omeprazole (PRILOSEC) 20 MG capsule Take 20 mg by mouth daily.  08/26/20  [provider]    Family History Family History  Problem Relation Age of Onset  . Dementia Mother   . Colon cancer Maternal Aunt 43  . Colon cancer Cousin 40       maternal  . Rectal cancer Neg Hx   . Stomach cancer Neg Hx   . Colon polyps Neg Hx   . Esophageal cancer Neg Hx     Social History Social History   Tobacco Use  . Smoking status: Current Every Day Smoker    Packs/day: 0.25    Types: Cigarettes  . Smokeless tobacco: Never Used  Vaping Use  . Vaping Use: Never used  Substance Use Topics  . Alcohol use: No  . Drug use: No     Allergies   Macrobid [nitrofurantoin macrocrystal]   Review of Systems Review of Systems  Constitutional: Negative for  appetite change, chills, diaphoresis, fever and unexpected weight change.  HENT: Positive for congestion. Negative for ear pain, sinus pressure, sinus pain, sneezing, sore throat and trouble swallowing.   Respiratory: Negative for cough, chest tightness and shortness of breath.   Cardiovascular: Negative for chest pain.  Gastrointestinal: Negative for abdominal distention, abdominal pain, anal bleeding, blood in stool, constipation, diarrhea, nausea, rectal pain and vomiting.       Gnawing sensation in stomach, intermittent reflux  Genitourinary: Negative for decreased urine volume, difficulty urinating, dysuria, flank pain, frequency, genital sores, hematuria and urgency.  Musculoskeletal: Negative for back pain and myalgias.  Neurological: Negative for dizziness, weakness, light-headedness and headaches.  All other systems reviewed and are negative.    Physical Exam Triage Vital Signs ED  Triage Vitals  Enc Vitals Group     BP 08/26/20 1039 (!) 182/75     Pulse Rate 08/26/20 1039 66     Resp 08/26/20 1039 17     Temp 08/26/20 1039 98.3 F (36.8 C)     Temp Source 08/26/20 1039 Oral     SpO2 08/26/20 1039 100 %     Weight --      Height --      Head Circumference --      Peak Flow --      Pain Score 08/26/20 1034 0     Pain Loc --      Pain Edu? --      Excl. in [place]? --    No data found.  Updated Vital Signs BP (!) 182/75 (BP Location: Left Arm)   Pulse 66   Temp 98.3 F (36.8 C) (Oral)   Resp 17   LMP  (Exact Date)   SpO2 100%   Visual Acuity Right Eye Distance:   Left Eye Distance:   Bilateral Distance:    Right Eye Near:   Left Eye Near:    Bilateral Near:     Physical Exam Vitals reviewed.  Constitutional:      General: She is not in acute distress.    Appearance: Normal appearance. She is not ill-appearing.  HENT:     Head: Normocephalic and atraumatic.     Right Ear: Hearing, tympanic membrane, ear canal and external ear normal. No swelling or  tenderness. There is no impacted cerumen. No mastoid tenderness. Tympanic membrane is not perforated, erythematous, retracted or bulging.     Left Ear: Hearing, tympanic membrane, ear canal and external ear normal. No swelling or tenderness. There is no impacted cerumen. No mastoid tenderness. Tympanic membrane is not perforated, erythematous, retracted or bulging.     Nose:     Right Sinus: No maxillary sinus tenderness or frontal sinus tenderness.     Left Sinus: No maxillary sinus tenderness or frontal sinus tenderness.     Mouth/Throat:     Mouth: Mucous membranes are moist.     Pharynx: Uvula midline. No oropharyngeal exudate or posterior oropharyngeal erythema.     Tonsils: No tonsillar exudate.  Cardiovascular:     Rate and Rhythm: Normal rate and regular rhythm.     Pulses:          Radial pulses are 2+ on the right side and 2+ on the left side.     Heart sounds: Normal heart sounds.  Pulmonary:     Breath sounds: Normal breath sounds and air entry. No wheezing, rhonchi or rales.  Chest:     Chest wall: No tenderness.  Abdominal:     General: Abdomen is flat. Bowel sounds are normal.     Tenderness: There is abdominal tenderness in the epigastric area. There is no right CVA tenderness, left CVA tenderness, guarding or rebound. Negative signs include Murphy's sign, Rovsing's sign and McBurney's sign.  Musculoskeletal:     Right lower leg: No edema.     Left lower leg: No edema.  Lymphadenopathy:     Cervical: No cervical adenopathy.  Neurological:     General: No focal deficit present.     Mental Status: She is alert and oriented to person, place, and time.  Psychiatric:        Attention and Perception: Attention and perception normal.        Mood and Affect: Mood and affect normal.  Behavior: Behavior normal. Behavior is cooperative.        Thought Content: Thought content normal.        Judgment: Judgment normal.      UC Treatments / Results  Labs (all labs  ordered are listed, but only abnormal results are displayed) Labs Reviewed - No data to display  EKG   Radiology No results found.  Procedures Procedures (including critical care time)  Medications Ordered in UC Medications  alum & mag hydroxide-simeth (MAALOX/MYLANTA) 200-200-20 MG/5ML suspension 30 mL (30 mLs Oral Given 08/26/20 1155)    Initial Impression / Assessment and Plan / UC Course  I have reviewed the triage vital signs and the nursing notes.  Pertinent labs & imaging results that were available during my care of the patient were reviewed by me and considered in my medical decision making (see chart for details).      GI cocktail administered with resolution of pain. Pt has not eaten today; rec eating bland food upon returning home. Also sent refill of protonix. Pt is already followed by GI for GERD and hiatal hernia; please f/u with them if symptoms worsen/persist.  For allegic rhinitis, continue Zyrtec. Trial of stopping flonase. F/u with PCP if symptoms worsen/persist.   This patient has a poor understanding of medications that she currently takes for her multiple chronic conditions. I discussed this with her in detail today. rec pt f/u with PCP for further discussion of medications she takes, side effects, etc.   Spent over 60 minutes obtaining H&P regarding multiple complicated acute and chronic conditions, performing physical, discussing results, treatment plan and plan for follow-up with patient. Patient agrees with plan.   Final Clinical Impressions(s) / UC Diagnoses   Final diagnoses:  Gastroesophageal reflux disease, unspecified whether esophagitis present  History of urinary tract infection  Mobitz type 2 second degree atrioventricular block  Cardiac pacemaker in situ  Allergic rhinitis due to pollen, unspecified seasonality  Hiatal hernia  Medication management     Discharge Instructions     -Take the protonix, one pill daily  -You can also take  Pepcid as needed -Continue with the Zyrtec. Try stopping the flonase and see if that helps with your nasal symptoms -Follow-up with primary care provider if symptoms worsen/persist -Head straight to ED if left-sided chest pain, ripping chest pain, pain down left arm, worst headache of life, vision changes, etc.     ED Prescriptions    Medication Sig Dispense Auth. Provider   pantoprazole (PROTONIX) 20 MG tablet Take 1 tablet (20 mg total) by mouth daily. 30 tablet Hazel Sams, PA-C     PDMP not reviewed this encounter.   Hazel Sams, PA-C 08/26/20 1238

## 2020-08-26 NOTE — ED Triage Notes (Signed)
Pt states she would like to get her heart checked.

## 2020-08-27 DIAGNOSIS — Z6821 Body mass index (BMI) 21.0-21.9, adult: Secondary | ICD-10-CM | POA: Diagnosis not present

## 2020-08-27 DIAGNOSIS — M25521 Pain in right elbow: Secondary | ICD-10-CM | POA: Diagnosis not present

## 2020-08-27 DIAGNOSIS — M2550 Pain in unspecified joint: Secondary | ICD-10-CM | POA: Diagnosis not present

## 2020-08-27 DIAGNOSIS — M150 Primary generalized (osteo)arthritis: Secondary | ICD-10-CM | POA: Diagnosis not present

## 2020-08-30 DIAGNOSIS — M150 Primary generalized (osteo)arthritis: Secondary | ICD-10-CM | POA: Diagnosis not present

## 2020-08-30 DIAGNOSIS — M0579 Rheumatoid arthritis with rheumatoid factor of multiple sites without organ or systems involvement: Secondary | ICD-10-CM | POA: Diagnosis not present

## 2020-08-30 DIAGNOSIS — M25521 Pain in right elbow: Secondary | ICD-10-CM | POA: Diagnosis not present

## 2020-08-30 DIAGNOSIS — Z6820 Body mass index (BMI) 20.0-20.9, adult: Secondary | ICD-10-CM | POA: Diagnosis not present

## 2020-09-20 DIAGNOSIS — G4700 Insomnia, unspecified: Secondary | ICD-10-CM | POA: Diagnosis not present

## 2020-09-20 DIAGNOSIS — E782 Mixed hyperlipidemia: Secondary | ICD-10-CM | POA: Diagnosis not present

## 2020-09-20 DIAGNOSIS — I10 Essential (primary) hypertension: Secondary | ICD-10-CM | POA: Diagnosis not present

## 2020-09-20 DIAGNOSIS — D509 Iron deficiency anemia, unspecified: Secondary | ICD-10-CM | POA: Diagnosis not present

## 2020-09-20 DIAGNOSIS — D649 Anemia, unspecified: Secondary | ICD-10-CM | POA: Diagnosis not present

## 2020-09-27 DIAGNOSIS — F17200 Nicotine dependence, unspecified, uncomplicated: Secondary | ICD-10-CM | POA: Diagnosis not present

## 2020-09-27 DIAGNOSIS — E782 Mixed hyperlipidemia: Secondary | ICD-10-CM | POA: Diagnosis not present

## 2020-09-27 DIAGNOSIS — F5101 Primary insomnia: Secondary | ICD-10-CM | POA: Diagnosis not present

## 2020-09-27 DIAGNOSIS — I10 Essential (primary) hypertension: Secondary | ICD-10-CM | POA: Diagnosis not present

## 2020-10-11 DIAGNOSIS — Z6821 Body mass index (BMI) 21.0-21.9, adult: Secondary | ICD-10-CM | POA: Diagnosis not present

## 2020-10-11 DIAGNOSIS — M150 Primary generalized (osteo)arthritis: Secondary | ICD-10-CM | POA: Diagnosis not present

## 2020-10-11 DIAGNOSIS — M25521 Pain in right elbow: Secondary | ICD-10-CM | POA: Diagnosis not present

## 2020-10-11 DIAGNOSIS — M0579 Rheumatoid arthritis with rheumatoid factor of multiple sites without organ or systems involvement: Secondary | ICD-10-CM | POA: Diagnosis not present

## 2020-10-22 ENCOUNTER — Ambulatory Visit (INDEPENDENT_AMBULATORY_CARE_PROVIDER_SITE_OTHER): Payer: Medicare HMO

## 2020-10-22 DIAGNOSIS — I4430 Unspecified atrioventricular block: Secondary | ICD-10-CM

## 2020-10-22 LAB — CUP PACEART REMOTE DEVICE CHECK
Battery Remaining Longevity: 103 mo
Battery Remaining Percentage: 95.5 %
Battery Voltage: 2.98 V
Brady Statistic AP VP Percent: 2.9 %
Brady Statistic AP VS Percent: 1.3 %
Brady Statistic AS VP Percent: 70 %
Brady Statistic AS VS Percent: 25 %
Brady Statistic RA Percent Paced: 4.1 %
Brady Statistic RV Percent Paced: 73 %
Date Time Interrogation Session: 20220328020035
Implantable Lead Implant Date: 20180404
Implantable Lead Implant Date: 20180404
Implantable Lead Location: 753859
Implantable Lead Location: 753860
Implantable Lead Model: 3830
Implantable Pulse Generator Implant Date: 20180404
Lead Channel Impedance Value: 590 Ohm
Lead Channel Impedance Value: 600 Ohm
Lead Channel Pacing Threshold Amplitude: 0.5 V
Lead Channel Pacing Threshold Amplitude: 0.5 V
Lead Channel Pacing Threshold Pulse Width: 0.4 ms
Lead Channel Pacing Threshold Pulse Width: 1 ms
Lead Channel Sensing Intrinsic Amplitude: 12 mV
Lead Channel Sensing Intrinsic Amplitude: 5 mV
Lead Channel Setting Pacing Amplitude: 2 V
Lead Channel Setting Pacing Amplitude: 2.5 V
Lead Channel Setting Pacing Pulse Width: 1 ms
Lead Channel Setting Sensing Sensitivity: 2 mV
Pulse Gen Model: 2272
Pulse Gen Serial Number: 8002284

## 2020-11-01 NOTE — Progress Notes (Signed)
Remote pacemaker transmission.   

## 2020-11-15 DIAGNOSIS — H04123 Dry eye syndrome of bilateral lacrimal glands: Secondary | ICD-10-CM | POA: Diagnosis not present

## 2020-11-15 DIAGNOSIS — H17822 Peripheral opacity of cornea, left eye: Secondary | ICD-10-CM | POA: Diagnosis not present

## 2020-11-15 DIAGNOSIS — H2513 Age-related nuclear cataract, bilateral: Secondary | ICD-10-CM | POA: Diagnosis not present

## 2020-11-19 DIAGNOSIS — I700 Atherosclerosis of aorta: Secondary | ICD-10-CM | POA: Diagnosis not present

## 2020-11-19 DIAGNOSIS — Z0000 Encounter for general adult medical examination without abnormal findings: Secondary | ICD-10-CM | POA: Diagnosis not present

## 2020-11-19 DIAGNOSIS — Z1159 Encounter for screening for other viral diseases: Secondary | ICD-10-CM | POA: Diagnosis not present

## 2020-11-19 DIAGNOSIS — E782 Mixed hyperlipidemia: Secondary | ICD-10-CM | POA: Diagnosis not present

## 2020-11-19 DIAGNOSIS — I10 Essential (primary) hypertension: Secondary | ICD-10-CM | POA: Diagnosis not present

## 2020-11-19 DIAGNOSIS — F17200 Nicotine dependence, unspecified, uncomplicated: Secondary | ICD-10-CM | POA: Diagnosis not present

## 2020-11-19 DIAGNOSIS — Z1389 Encounter for screening for other disorder: Secondary | ICD-10-CM | POA: Diagnosis not present

## 2020-11-19 DIAGNOSIS — F5101 Primary insomnia: Secondary | ICD-10-CM | POA: Diagnosis not present

## 2020-11-19 DIAGNOSIS — Z950 Presence of cardiac pacemaker: Secondary | ICD-10-CM | POA: Diagnosis not present

## 2020-12-25 DIAGNOSIS — I10 Essential (primary) hypertension: Secondary | ICD-10-CM | POA: Diagnosis not present

## 2020-12-25 DIAGNOSIS — E782 Mixed hyperlipidemia: Secondary | ICD-10-CM | POA: Diagnosis not present

## 2020-12-25 DIAGNOSIS — G4700 Insomnia, unspecified: Secondary | ICD-10-CM | POA: Diagnosis not present

## 2020-12-25 DIAGNOSIS — D509 Iron deficiency anemia, unspecified: Secondary | ICD-10-CM | POA: Diagnosis not present

## 2020-12-25 DIAGNOSIS — D649 Anemia, unspecified: Secondary | ICD-10-CM | POA: Diagnosis not present

## 2021-01-17 DIAGNOSIS — M25521 Pain in right elbow: Secondary | ICD-10-CM | POA: Diagnosis not present

## 2021-01-17 DIAGNOSIS — M150 Primary generalized (osteo)arthritis: Secondary | ICD-10-CM | POA: Diagnosis not present

## 2021-01-17 DIAGNOSIS — M0579 Rheumatoid arthritis with rheumatoid factor of multiple sites without organ or systems involvement: Secondary | ICD-10-CM | POA: Diagnosis not present

## 2021-01-21 ENCOUNTER — Ambulatory Visit (INDEPENDENT_AMBULATORY_CARE_PROVIDER_SITE_OTHER): Payer: Medicare HMO

## 2021-01-21 DIAGNOSIS — G4700 Insomnia, unspecified: Secondary | ICD-10-CM | POA: Diagnosis not present

## 2021-01-21 DIAGNOSIS — D649 Anemia, unspecified: Secondary | ICD-10-CM | POA: Diagnosis not present

## 2021-01-21 DIAGNOSIS — D509 Iron deficiency anemia, unspecified: Secondary | ICD-10-CM | POA: Diagnosis not present

## 2021-01-21 DIAGNOSIS — I10 Essential (primary) hypertension: Secondary | ICD-10-CM | POA: Diagnosis not present

## 2021-01-21 DIAGNOSIS — E782 Mixed hyperlipidemia: Secondary | ICD-10-CM | POA: Diagnosis not present

## 2021-01-21 DIAGNOSIS — I4430 Unspecified atrioventricular block: Secondary | ICD-10-CM

## 2021-01-22 LAB — CUP PACEART REMOTE DEVICE CHECK
Battery Remaining Longevity: 52 mo
Battery Remaining Percentage: 53 %
Battery Voltage: 2.98 V
Brady Statistic AP VP Percent: 3.3 %
Brady Statistic AP VS Percent: 1.2 %
Brady Statistic AS VP Percent: 72 %
Brady Statistic AS VS Percent: 23 %
Brady Statistic RA Percent Paced: 4.4 %
Brady Statistic RV Percent Paced: 75 %
Date Time Interrogation Session: 20220627025019
Implantable Lead Implant Date: 20180404
Implantable Lead Implant Date: 20180404
Implantable Lead Location: 753859
Implantable Lead Location: 753860
Implantable Lead Model: 3830
Implantable Pulse Generator Implant Date: 20180404
Lead Channel Impedance Value: 530 Ohm
Lead Channel Impedance Value: 600 Ohm
Lead Channel Pacing Threshold Amplitude: 0.5 V
Lead Channel Pacing Threshold Amplitude: 0.5 V
Lead Channel Pacing Threshold Pulse Width: 0.4 ms
Lead Channel Pacing Threshold Pulse Width: 1 ms
Lead Channel Sensing Intrinsic Amplitude: 12 mV
Lead Channel Sensing Intrinsic Amplitude: 5 mV
Lead Channel Setting Pacing Amplitude: 2 V
Lead Channel Setting Pacing Amplitude: 2.5 V
Lead Channel Setting Pacing Pulse Width: 1 ms
Lead Channel Setting Sensing Sensitivity: 2 mV
Pulse Gen Model: 2272
Pulse Gen Serial Number: 8002284

## 2021-02-11 DIAGNOSIS — D649 Anemia, unspecified: Secondary | ICD-10-CM | POA: Diagnosis not present

## 2021-02-11 DIAGNOSIS — Z86010 Personal history of colonic polyps: Secondary | ICD-10-CM | POA: Diagnosis not present

## 2021-02-11 DIAGNOSIS — K219 Gastro-esophageal reflux disease without esophagitis: Secondary | ICD-10-CM | POA: Diagnosis not present

## 2021-02-11 DIAGNOSIS — K2970 Gastritis, unspecified, without bleeding: Secondary | ICD-10-CM | POA: Diagnosis not present

## 2021-02-11 NOTE — Progress Notes (Signed)
Remote pacemaker transmission.   

## 2021-02-21 DIAGNOSIS — D649 Anemia, unspecified: Secondary | ICD-10-CM | POA: Diagnosis not present

## 2021-02-22 DIAGNOSIS — I10 Essential (primary) hypertension: Secondary | ICD-10-CM | POA: Diagnosis not present

## 2021-02-22 DIAGNOSIS — E782 Mixed hyperlipidemia: Secondary | ICD-10-CM | POA: Diagnosis not present

## 2021-02-22 DIAGNOSIS — F5101 Primary insomnia: Secondary | ICD-10-CM | POA: Diagnosis not present

## 2021-02-22 DIAGNOSIS — K219 Gastro-esophageal reflux disease without esophagitis: Secondary | ICD-10-CM | POA: Diagnosis not present

## 2021-03-01 DIAGNOSIS — M19021 Primary osteoarthritis, right elbow: Secondary | ICD-10-CM | POA: Diagnosis not present

## 2021-03-01 DIAGNOSIS — M4692 Unspecified inflammatory spondylopathy, cervical region: Secondary | ICD-10-CM | POA: Diagnosis not present

## 2021-03-01 DIAGNOSIS — M19012 Primary osteoarthritis, left shoulder: Secondary | ICD-10-CM | POA: Diagnosis not present

## 2021-03-04 ENCOUNTER — Telehealth: Payer: Self-pay | Admitting: Internal Medicine

## 2021-03-04 NOTE — Telephone Encounter (Signed)
Left message requesting  call back.

## 2021-03-04 NOTE — Telephone Encounter (Signed)
Call back received from rep with [facility].  Pt had carvedilol increased to 12.5 mg BID, but Pt did not tolerate d/t fatigue.  She is not take carvedilol 6.25 mg TID.  Verapamil in Am and irbesartan at bedtime.  Question regarding increasing verapamil and concern for bradycardia (?)  Advised Pt has pacemaker.  So pharmacist does not need to be concerned by bradycardia.  Bretlyn will advise pharmacist.  Advised if any questions or if Pt needs to be seen by our pharmacists for HTN management please let office know.

## 2021-03-04 NOTE — Telephone Encounter (Signed)
  Pt c/o medication issue:  1. Name of Medication:  Verapamil carvedilol (COREG) 6.25 MG tablet  2. How are you currently taking this medication (dosage and times per day)? Verapamil 1 tablet daily, carvedilol 1 tablet twice a day (once in the morning and once in the evening)  3. Are you having a reaction (difficulty breathing--STAT)? no  4. What is your medication issue? Bretlyn from [facility] states the patient's medications were changed by the pharmacist at their office. She states she was taking 2 tablets of the carvedilol at once and her BP looked good, but she was very tired. She states now her BP is elevating since the medication was changed. She states they are concerned for bradycardia as well. Phone: [phone] BP readings:  8/5 170/89 HR 66 8/4 169/80 HR 60 8/3 180/92 HR 63

## 2021-03-13 DIAGNOSIS — D509 Iron deficiency anemia, unspecified: Secondary | ICD-10-CM | POA: Diagnosis not present

## 2021-03-13 DIAGNOSIS — I10 Essential (primary) hypertension: Secondary | ICD-10-CM | POA: Diagnosis not present

## 2021-03-13 DIAGNOSIS — G4700 Insomnia, unspecified: Secondary | ICD-10-CM | POA: Diagnosis not present

## 2021-03-13 DIAGNOSIS — E782 Mixed hyperlipidemia: Secondary | ICD-10-CM | POA: Diagnosis not present

## 2021-03-13 DIAGNOSIS — K219 Gastro-esophageal reflux disease without esophagitis: Secondary | ICD-10-CM | POA: Diagnosis not present

## 2021-03-13 DIAGNOSIS — D649 Anemia, unspecified: Secondary | ICD-10-CM | POA: Diagnosis not present

## 2021-03-14 DIAGNOSIS — D649 Anemia, unspecified: Secondary | ICD-10-CM | POA: Diagnosis not present

## 2021-03-21 DIAGNOSIS — M0579 Rheumatoid arthritis with rheumatoid factor of multiple sites without organ or systems involvement: Secondary | ICD-10-CM | POA: Diagnosis not present

## 2021-04-22 ENCOUNTER — Ambulatory Visit (INDEPENDENT_AMBULATORY_CARE_PROVIDER_SITE_OTHER): Payer: Medicare HMO

## 2021-04-22 DIAGNOSIS — I4430 Unspecified atrioventricular block: Secondary | ICD-10-CM

## 2021-04-22 LAB — CUP PACEART REMOTE DEVICE CHECK
Battery Remaining Longevity: 49 mo
Battery Remaining Percentage: 50 %
Battery Voltage: 2.99 V
Brady Statistic AP VP Percent: 3.3 %
Brady Statistic AP VS Percent: 1.7 %
Brady Statistic AS VP Percent: 69 %
Brady Statistic AS VS Percent: 26 %
Brady Statistic RA Percent Paced: 5 %
Brady Statistic RV Percent Paced: 72 %
Date Time Interrogation Session: 20220926020016
Implantable Lead Implant Date: 20180404
Implantable Lead Implant Date: 20180404
Implantable Lead Location: 753859
Implantable Lead Location: 753860
Implantable Lead Model: 3830
Implantable Pulse Generator Implant Date: 20180404
Lead Channel Impedance Value: 510 Ohm
Lead Channel Impedance Value: 590 Ohm
Lead Channel Pacing Threshold Amplitude: 0.5 V
Lead Channel Pacing Threshold Amplitude: 0.5 V
Lead Channel Pacing Threshold Pulse Width: 0.4 ms
Lead Channel Pacing Threshold Pulse Width: 1 ms
Lead Channel Sensing Intrinsic Amplitude: 12 mV
Lead Channel Sensing Intrinsic Amplitude: 5 mV
Lead Channel Setting Pacing Amplitude: 2 V
Lead Channel Setting Pacing Amplitude: 2.5 V
Lead Channel Setting Pacing Pulse Width: 1 ms
Lead Channel Setting Sensing Sensitivity: 2 mV
Pulse Gen Model: 2272
Pulse Gen Serial Number: 8002284

## 2021-04-29 NOTE — Progress Notes (Signed)
Remote pacemaker transmission.   

## 2021-05-14 DIAGNOSIS — H17822 Peripheral opacity of cornea, left eye: Secondary | ICD-10-CM | POA: Diagnosis not present

## 2021-05-14 DIAGNOSIS — H04123 Dry eye syndrome of bilateral lacrimal glands: Secondary | ICD-10-CM | POA: Diagnosis not present

## 2021-05-14 DIAGNOSIS — H2513 Age-related nuclear cataract, bilateral: Secondary | ICD-10-CM | POA: Diagnosis not present

## 2021-05-21 DIAGNOSIS — F17200 Nicotine dependence, unspecified, uncomplicated: Secondary | ICD-10-CM | POA: Diagnosis not present

## 2021-05-21 DIAGNOSIS — I700 Atherosclerosis of aorta: Secondary | ICD-10-CM | POA: Diagnosis not present

## 2021-05-21 DIAGNOSIS — Z23 Encounter for immunization: Secondary | ICD-10-CM | POA: Diagnosis not present

## 2021-05-21 DIAGNOSIS — I10 Essential (primary) hypertension: Secondary | ICD-10-CM | POA: Diagnosis not present

## 2021-05-21 DIAGNOSIS — K219 Gastro-esophageal reflux disease without esophagitis: Secondary | ICD-10-CM | POA: Diagnosis not present

## 2021-05-21 DIAGNOSIS — G4700 Insomnia, unspecified: Secondary | ICD-10-CM | POA: Diagnosis not present

## 2021-05-21 DIAGNOSIS — E782 Mixed hyperlipidemia: Secondary | ICD-10-CM | POA: Diagnosis not present

## 2021-05-21 DIAGNOSIS — D509 Iron deficiency anemia, unspecified: Secondary | ICD-10-CM | POA: Diagnosis not present

## 2021-05-21 DIAGNOSIS — D649 Anemia, unspecified: Secondary | ICD-10-CM | POA: Diagnosis not present

## 2021-05-24 DIAGNOSIS — I10 Essential (primary) hypertension: Secondary | ICD-10-CM | POA: Diagnosis not present

## 2021-06-25 DIAGNOSIS — I10 Essential (primary) hypertension: Secondary | ICD-10-CM | POA: Diagnosis not present

## 2021-07-01 DIAGNOSIS — Z1231 Encounter for screening mammogram for malignant neoplasm of breast: Secondary | ICD-10-CM | POA: Diagnosis not present

## 2021-07-18 DIAGNOSIS — H2511 Age-related nuclear cataract, right eye: Secondary | ICD-10-CM | POA: Diagnosis not present

## 2021-07-23 ENCOUNTER — Ambulatory Visit (INDEPENDENT_AMBULATORY_CARE_PROVIDER_SITE_OTHER): Payer: Medicare HMO

## 2021-07-23 DIAGNOSIS — I4430 Unspecified atrioventricular block: Secondary | ICD-10-CM

## 2021-07-23 LAB — CUP PACEART REMOTE DEVICE CHECK
Battery Remaining Longevity: 47 mo
Battery Remaining Percentage: 48 %
Battery Voltage: 2.98 V
Brady Statistic AP VP Percent: 3.6 %
Brady Statistic AP VS Percent: 1.7 %
Brady Statistic AS VP Percent: 69 %
Brady Statistic AS VS Percent: 26 %
Brady Statistic RA Percent Paced: 5.2 %
Brady Statistic RV Percent Paced: 72 %
Date Time Interrogation Session: 20221226020013
Implantable Lead Implant Date: 20180404
Implantable Lead Implant Date: 20180404
Implantable Lead Location: 753859
Implantable Lead Location: 753860
Implantable Lead Model: 3830
Implantable Pulse Generator Implant Date: 20180404
Lead Channel Impedance Value: 530 Ohm
Lead Channel Impedance Value: 580 Ohm
Lead Channel Pacing Threshold Amplitude: 0.5 V
Lead Channel Pacing Threshold Amplitude: 0.5 V
Lead Channel Pacing Threshold Pulse Width: 0.4 ms
Lead Channel Pacing Threshold Pulse Width: 1 ms
Lead Channel Sensing Intrinsic Amplitude: 12 mV
Lead Channel Sensing Intrinsic Amplitude: 4.4 mV
Lead Channel Setting Pacing Amplitude: 2 V
Lead Channel Setting Pacing Amplitude: 2.5 V
Lead Channel Setting Pacing Pulse Width: 1 ms
Lead Channel Setting Sensing Sensitivity: 2 mV
Pulse Gen Model: 2272
Pulse Gen Serial Number: 8002284

## 2021-07-25 DIAGNOSIS — M25521 Pain in right elbow: Secondary | ICD-10-CM | POA: Diagnosis not present

## 2021-07-25 DIAGNOSIS — E782 Mixed hyperlipidemia: Secondary | ICD-10-CM | POA: Diagnosis not present

## 2021-07-25 DIAGNOSIS — M150 Primary generalized (osteo)arthritis: Secondary | ICD-10-CM | POA: Diagnosis not present

## 2021-07-25 DIAGNOSIS — G4700 Insomnia, unspecified: Secondary | ICD-10-CM | POA: Diagnosis not present

## 2021-07-25 DIAGNOSIS — K219 Gastro-esophageal reflux disease without esophagitis: Secondary | ICD-10-CM | POA: Diagnosis not present

## 2021-07-25 DIAGNOSIS — D509 Iron deficiency anemia, unspecified: Secondary | ICD-10-CM | POA: Diagnosis not present

## 2021-07-25 DIAGNOSIS — M0579 Rheumatoid arthritis with rheumatoid factor of multiple sites without organ or systems involvement: Secondary | ICD-10-CM | POA: Diagnosis not present

## 2021-07-25 DIAGNOSIS — Z6821 Body mass index (BMI) 21.0-21.9, adult: Secondary | ICD-10-CM | POA: Diagnosis not present

## 2021-07-25 DIAGNOSIS — I10 Essential (primary) hypertension: Secondary | ICD-10-CM | POA: Diagnosis not present

## 2021-07-26 DIAGNOSIS — I10 Essential (primary) hypertension: Secondary | ICD-10-CM | POA: Diagnosis not present

## 2021-07-31 NOTE — Progress Notes (Signed)
Remote pacemaker transmission.   

## 2021-08-14 DIAGNOSIS — I10 Essential (primary) hypertension: Secondary | ICD-10-CM | POA: Diagnosis not present

## 2021-08-14 DIAGNOSIS — K219 Gastro-esophageal reflux disease without esophagitis: Secondary | ICD-10-CM | POA: Diagnosis not present

## 2021-08-14 DIAGNOSIS — G4700 Insomnia, unspecified: Secondary | ICD-10-CM | POA: Diagnosis not present

## 2021-08-14 DIAGNOSIS — E782 Mixed hyperlipidemia: Secondary | ICD-10-CM | POA: Diagnosis not present

## 2021-08-26 DIAGNOSIS — I10 Essential (primary) hypertension: Secondary | ICD-10-CM | POA: Diagnosis not present

## 2021-09-17 ENCOUNTER — Ambulatory Visit (HOSPITAL_COMMUNITY)
Admission: EM | Admit: 2021-09-17 | Discharge: 2021-09-17 | Disposition: A | Discharge status: Home / Self Care | Payer: Medicare PPO | Attending: Nurse Practitioner | Admitting: Nurse Practitioner

## 2021-09-17 ENCOUNTER — Encounter (HOSPITAL_COMMUNITY): Payer: Self-pay | Admitting: Emergency Medicine

## 2021-09-17 ENCOUNTER — Other Ambulatory Visit: Payer: Self-pay

## 2021-09-17 DIAGNOSIS — R300 Dysuria: Secondary | ICD-10-CM | POA: Diagnosis not present

## 2021-09-17 DIAGNOSIS — I10 Essential (primary) hypertension: Secondary | ICD-10-CM | POA: Diagnosis not present

## 2021-09-17 DIAGNOSIS — E782 Mixed hyperlipidemia: Secondary | ICD-10-CM | POA: Diagnosis not present

## 2021-09-17 LAB — POCT URINALYSIS DIPSTICK, ED / UC
Bilirubin Urine: NEGATIVE
Glucose, UA: NEGATIVE mg/dL
Hgb urine dipstick: NEGATIVE
Ketones, ur: NEGATIVE mg/dL
Nitrite: NEGATIVE
Protein, ur: NEGATIVE mg/dL
Specific Gravity, Urine: 1.015 (ref 1.005–1.030)
Urobilinogen, UA: 0.2 mg/dL (ref 0.0–1.0)
pH: 5.5 (ref 5.0–8.0)

## 2021-09-17 MED ORDER — SULFAMETHOXAZOLE-TRIMETHOPRIM 800-160 MG PO TABS: 1.0000 | ORAL_TABLET | Freq: Two times a day (BID) | ORAL | 0 refills | Status: AC

## 2021-09-17 NOTE — ED Provider Notes (Signed)
[facility]    CSN: [id number] Arrival date & time: 09/17/21  1724      History   Chief Complaint Chief Complaint  Patient presents with   Urinary Tract Infection    HPI Denise Hodges is a 76 y.o. female.   Patient reports urinary symptoms for the past 4 days.  She reports increased urinary frequency, urgency, voiding small amounts, significant burning with urination.  Denies urinary incontinence, change in the odor of her urine, blood in her urine.  She does endorse some bilateral back pain and suprapubic pressure.  Denies nausea, vomiting, fevers.  She has tried drinking more water without complete relief of symptoms.  Denies any vaginal discharge.    She reports her blood pressure has been elevated all day today at home.  She thinks her blood pressure is elevated related to her urinary pain.     Past Medical History:  Diagnosis Date   Allergy    Arthritis    Bradycardia    pacemaker placed in April 2018  [employer]   GERD (gastroesophageal reflux disease)    Hyperlipidemia    Hypertension    Seasonal allergies     Patient Active Problem List   Diagnosis Date Noted   Pacemaker 02/10/2019   Heart block, AV 10/29/2016   Mobitz type 2 second degree atrioventricular block 10/29/2016   Arthritis of shoulder 12/31/2012   HTN (hypertension) 10/22/2012   Pure hypercholesterolemia 10/22/2012    Past Surgical History:  Procedure Laterality Date   ABDOMINAL HYSTERECTOMY  1995   COLONOSCOPY  2013   [place]   bilateral for flat feet with pain   PACEMAKER IMPLANT N/A 10/29/2016   Procedure: Pacemaker Implant;  Surgeon: Evans Lance, MD;  Location: [facility];  Service: Cardiovascular;  Laterality: N/A;   POLYPECTOMY     TONSILLECTOMY  as child   TOTAL SHOULDER ARTHROPLASTY Right 12/30/2012   Procedure: RIGHT TOTAL SHOULDER ARTHROPLASTY;  Surgeon: Nita Sells, MD;  Location: [facility];  Service: Orthopedics;  Laterality:  Right;  interscaline block   WISDOM TOOTH EXTRACTION      OB History   No obstetric history on file.      Home Medications    Prior to Admission medications   Medication Sig Start Date End Date Taking? Authorizing Provider  sulfamethoxazole-trimethoprim (BACTRIM DS) 800-160 MG tablet Take 1 tablet by mouth 2 (two) times daily for 3 days. 09/17/21 09/20/21 Yes Eulogio Bear, NP  aspirin 81 MG tablet Take 81 mg by mouth daily. Patient not taking: Reported on 09/17/2021    [provider]  Calcium Carbonate-Vitamin D (CALCIUM 600 + D PO) Take 1 tablet by mouth daily.    [provider]  carvedilol (COREG) 6.25 MG tablet Take 1 tablet (6.25 mg total) by mouth 2 (two) times daily with a meal. 07/05/19   Evans Lance, MD  cetirizine (ZYRTEC) 10 MG tablet Take 10 mg by mouth daily.    [provider]  cholecalciferol (VITAMIN D-400) 400 UNITS TABS Take 400 Units by mouth daily.    [provider]  Cod Liver Oil 1000 MG CAPS Take 1 capsule by mouth daily.    [provider]  Enema Mineral Oil ENEM Place 1 application rectally daily. 01/22/18   Wurst, [place], PA-C  fenofibrate 160 MG tablet Take 1 tablet by mouth daily. 02/06/19   [provider]  Garlic 10 MG CAPS Take 1 tablet  by mouth daily.    [provider]  guaiFENesin (MUCINEX) 600 MG 12 hr tablet Take 600 mg by mouth 2 (two) times daily.    [provider]  ibuprofen (ADVIL,MOTRIN) 200 MG tablet Take 400 mg by mouth every 6 (six) hours as needed for mild pain.    [provider]  irbesartan (AVAPRO) 150 MG tablet Take 1 tablet (150 mg total) by mouth daily. 02/10/19   Evans Lance, MD  meclizine (ANTIVERT) 12.5 MG tablet Take 1 tablet (12.5 mg total) by mouth 3 (three) times daily as needed for dizziness. Patient not taking: Reported on 09/17/2021 03/04/20   Domingo Dimes, PA-C  methotrexate 2.5 MG tablet Take 15 mg by mouth once a week. 08/14/21    [provider]  naproxen sodium (ALEVE) 220 MG tablet Take 220 mg by mouth.    [provider]  pantoprazole (PROTONIX) 20 MG tablet Take 1 tablet (20 mg total) by mouth daily. 08/26/20   Hazel Sams, PA-C  polyethylene glycol (MIRALAX / GLYCOLAX) packet Take 17 g by mouth daily. 01/22/18   Wurst, [place], PA-C  predniSONE (DELTASONE) 5 MG tablet Take 5 mg by mouth daily. 08/19/21   [provider]  vitamin C (ASCORBIC ACID) 500 MG tablet Take 500 mg by mouth daily.    [provider]  omeprazole (PRILOSEC) 20 MG capsule Take 20 mg by mouth daily.  08/26/20  [provider]    Family History Family History  Problem Relation Age of Onset   Dementia Mother    Colon cancer Maternal Aunt 10   Colon cancer Cousin 65       maternal   Rectal cancer Neg Hx    Stomach cancer Neg Hx    Colon polyps Neg Hx    Esophageal cancer Neg Hx     Social History Social History   Tobacco Use   Smoking status: Every Day    Packs/day: 0.25    Types: Cigarettes   Smokeless tobacco: Never  Vaping Use   Vaping Use: Never used  Substance Use Topics   Alcohol use: No   Drug use: No     Allergies   Macrobid [nitrofurantoin macrocrystal]   Review of Systems Review of Systems Per HPI  Physical Exam Triage Vital Signs ED Triage Vitals  Enc Vitals Group     BP 09/17/21 1847 (!) 173/81     Pulse Rate 09/17/21 1847 65     Resp 09/17/21 1847 18     Temp 09/17/21 1847 98.4 F (36.9 C)     Temp Source 09/17/21 1847 Oral     SpO2 09/17/21 1847 97 %     Weight --      Height --      Head Circumference --      Peak Flow --      Pain Score 09/17/21 1843 8     Pain Loc --      Pain Edu? --      Excl. in [place]? --    No data found.  Updated Vital Signs BP (!) 173/81 (BP Location: Right Arm)    Pulse 65    Temp 98.4 F (36.9 C) (Oral)    Resp 18    SpO2 97%   Visual Acuity Right Eye Distance:   Left Eye Distance:   Bilateral Distance:     Right Eye Near:   Left Eye Near:    Bilateral Near:  Physical Exam Vitals and nursing note reviewed.  Constitutional:      General: She is not in acute distress.    Appearance: Normal appearance. She is not toxic-appearing.  Abdominal:     General: Abdomen is flat. Bowel sounds are normal. There is no distension.     Palpations: Abdomen is soft.     Tenderness: There is no abdominal tenderness. There is no right CVA tenderness, left CVA tenderness or guarding.  Skin:    General: Skin is warm and dry.     Coloration: Skin is not jaundiced or pale.     Findings: No erythema.  Neurological:     Mental Status: She is alert and oriented to person, place, and time.     Motor: No weakness.     Gait: Gait normal.  Psychiatric:        Mood and Affect: Mood normal.        Behavior: Behavior normal.        Thought Content: Thought content normal.        Judgment: Judgment normal.     UC Treatments / Results  Labs (all labs ordered are listed, but only abnormal results are displayed) Labs Reviewed  POCT URINALYSIS DIPSTICK, ED / UC - Abnormal; Notable for the following components:      Result Value   Leukocytes,Ua SMALL (*)    All other components within normal limits  URINE CULTURE    EKG   Radiology No results found.  Procedures Procedures (including critical care time)  Medications Ordered in UC Medications - No data to display  Initial Impression / Assessment and Plan / UC Course  I have reviewed the triage vital signs and the nursing notes.  Pertinent labs & imaging results that were available during my care of the patient were reviewed by me and considered in my medical decision making (see chart for details).    Dipstick urinalysis today shows small amount leukocytes.  Send urine for culture and in the meantime, treat with Bactrim DS twice daily for 3 days.  Previous urine culture shows resistance amoxicillin and cephalosporin, previously treated with  Bactrim and tolerated well.  If symptoms of pyelonephritis develop like nausea/vomiting, and unable to keep fluids down, go to the ER immediately.    Final Clinical Impressions(s) / UC Diagnoses   Final diagnoses:  Dysuria   Discharge Instructions   None    ED Prescriptions     Medication Sig Dispense Auth. Provider   sulfamethoxazole-trimethoprim (BACTRIM DS) 800-160 MG tablet Take 1 tablet by mouth 2 (two) times daily for 3 days. 6 tablet Eulogio Bear, NP      PDMP not reviewed this encounter.   Eulogio Bear, NP 09/17/21 1921

## 2021-09-17 NOTE — ED Triage Notes (Signed)
History of UTI's.  Saturday started having symptoms .  Complains of burning with urination, pain in lower back, and pressure on bladder

## 2021-09-17 NOTE — Discharge Instructions (Signed)
Please start the Bactrim and take all of it.  If your symptoms worsen, please go to the Emergency Room.  Please continue to push hydration with water.  Follow up with your PCP if your blood pressure does not improve after the pain improves.

## 2021-09-20 LAB — URINE CULTURE: Culture: >=100000 — AB

## 2021-10-21 ENCOUNTER — Ambulatory Visit (INDEPENDENT_AMBULATORY_CARE_PROVIDER_SITE_OTHER): Payer: Medicare PPO

## 2021-10-21 DIAGNOSIS — I4430 Unspecified atrioventricular block: Secondary | ICD-10-CM | POA: Diagnosis not present

## 2021-10-23 LAB — CUP PACEART REMOTE DEVICE CHECK
Battery Remaining Longevity: 43 mo
Battery Remaining Percentage: 44 %
Battery Voltage: 2.96 V
Brady Statistic AP VP Percent: 3.7 %
Brady Statistic AP VS Percent: 1.6 %
Brady Statistic AS VP Percent: 70 %
Brady Statistic AS VS Percent: 24 %
Brady Statistic RA Percent Paced: 5.2 %
Brady Statistic RV Percent Paced: 74 %
Date Time Interrogation Session: 20230327020015
Implantable Lead Implant Date: 20180404
Implantable Lead Implant Date: 20180404
Implantable Lead Location: 753859
Implantable Lead Location: 753860
Implantable Lead Model: 3830
Implantable Pulse Generator Implant Date: 20180404
Lead Channel Impedance Value: 490 Ohm
Lead Channel Impedance Value: 590 Ohm
Lead Channel Pacing Threshold Amplitude: 0.5 V
Lead Channel Pacing Threshold Amplitude: 0.5 V
Lead Channel Pacing Threshold Pulse Width: 0.4 ms
Lead Channel Pacing Threshold Pulse Width: 1 ms
Lead Channel Sensing Intrinsic Amplitude: 12 mV
Lead Channel Sensing Intrinsic Amplitude: 4.7 mV
Lead Channel Setting Pacing Amplitude: 2 V
Lead Channel Setting Pacing Amplitude: 2.5 V
Lead Channel Setting Pacing Pulse Width: 1 ms
Lead Channel Setting Sensing Sensitivity: 2 mV
Pulse Gen Model: 2272
Pulse Gen Serial Number: 8002284

## 2021-10-24 DIAGNOSIS — I10 Essential (primary) hypertension: Secondary | ICD-10-CM | POA: Diagnosis not present

## 2021-10-30 NOTE — Progress Notes (Signed)
Remote pacemaker transmission.   

## 2021-11-03 DIAGNOSIS — H2512 Age-related nuclear cataract, left eye: Secondary | ICD-10-CM | POA: Diagnosis not present

## 2021-11-07 DIAGNOSIS — H2512 Age-related nuclear cataract, left eye: Secondary | ICD-10-CM | POA: Diagnosis not present

## 2021-11-13 DIAGNOSIS — M1991 Primary osteoarthritis, unspecified site: Secondary | ICD-10-CM | POA: Diagnosis not present

## 2021-11-13 DIAGNOSIS — Z6822 Body mass index (BMI) 22.0-22.9, adult: Secondary | ICD-10-CM | POA: Diagnosis not present

## 2021-11-13 DIAGNOSIS — M25521 Pain in right elbow: Secondary | ICD-10-CM | POA: Diagnosis not present

## 2021-11-13 DIAGNOSIS — M0579 Rheumatoid arthritis with rheumatoid factor of multiple sites without organ or systems involvement: Secondary | ICD-10-CM | POA: Diagnosis not present

## 2021-12-10 DIAGNOSIS — F17200 Nicotine dependence, unspecified, uncomplicated: Secondary | ICD-10-CM | POA: Diagnosis not present

## 2021-12-10 DIAGNOSIS — E782 Mixed hyperlipidemia: Secondary | ICD-10-CM | POA: Diagnosis not present

## 2021-12-10 DIAGNOSIS — Z0000 Encounter for general adult medical examination without abnormal findings: Secondary | ICD-10-CM | POA: Diagnosis not present

## 2021-12-10 DIAGNOSIS — Z950 Presence of cardiac pacemaker: Secondary | ICD-10-CM | POA: Diagnosis not present

## 2021-12-10 DIAGNOSIS — I700 Atherosclerosis of aorta: Secondary | ICD-10-CM | POA: Diagnosis not present

## 2021-12-10 DIAGNOSIS — I10 Essential (primary) hypertension: Secondary | ICD-10-CM | POA: Diagnosis not present

## 2021-12-10 DIAGNOSIS — F5101 Primary insomnia: Secondary | ICD-10-CM | POA: Diagnosis not present

## 2021-12-10 DIAGNOSIS — Z1331 Encounter for screening for depression: Secondary | ICD-10-CM | POA: Diagnosis not present

## 2021-12-25 DIAGNOSIS — I10 Essential (primary) hypertension: Secondary | ICD-10-CM | POA: Diagnosis not present

## 2022-01-06 ENCOUNTER — Ambulatory Visit (HOSPITAL_COMMUNITY)
Admission: EM | Admit: 2022-01-06 | Discharge: 2022-01-06 | Disposition: A | Discharge status: Home / Self Care | Payer: Medicare PPO | Attending: Nurse Practitioner | Admitting: Nurse Practitioner

## 2022-01-06 ENCOUNTER — Encounter (HOSPITAL_COMMUNITY): Payer: Self-pay | Admitting: Emergency Medicine

## 2022-01-06 DIAGNOSIS — N390 Urinary tract infection, site not specified: Secondary | ICD-10-CM

## 2022-01-06 LAB — POCT URINALYSIS DIPSTICK, ED / UC
Bilirubin Urine: NEGATIVE
Glucose, UA: NEGATIVE mg/dL
Ketones, ur: NEGATIVE mg/dL
Nitrite: NEGATIVE
Protein, ur: NEGATIVE mg/dL
Specific Gravity, Urine: 1.02 (ref 1.005–1.030)
Urobilinogen, UA: 0.2 mg/dL (ref 0.0–1.0)
pH: 6.5 (ref 5.0–8.0)

## 2022-01-06 MED ORDER — CEPHALEXIN 500 MG PO CAPS: 500.0000 mg | ORAL_CAPSULE | Freq: Three times a day (TID) | ORAL | 0 refills | Status: AC

## 2022-01-06 MED ORDER — PHENAZOPYRIDINE HCL 200 MG PO TABS: 200.0000 mg | ORAL_TABLET | Freq: Three times a day (TID) | ORAL | 0 refills | Status: DC

## 2022-01-06 NOTE — Discharge Instructions (Addendum)
Denise Hodges  This is your after visit summary for today.   You have a urinary tract infection. You have been prescribed Keflex 500 mg three times a day for 7 days. If you experience any abnormal symptoms as discussed please contact our office.   The Pyridium 200 mg three times a day is for the bladder pain and spasm. This will discolor your urine.  Continue to hydrate well with water and 100% cranberry juice.  Please feel free to call our office, if you have any questions. Have a good day. Dionisio David NP

## 2022-01-06 NOTE — ED Triage Notes (Signed)
Pt is present today with dysuria and abdominal pain. Pt states sx started yesterday

## 2022-01-06 NOTE — ED Provider Notes (Signed)
[facility]    CSN: [id number] Arrival date & time: 01/06/22  1037      History   Chief Complaint Chief Complaint  Patient presents with   Abdominal Pain   Dysuria    HPI  Denise Hodges is a 76 y.o. female who complains of urinary frequency, urgency and dysuria x 1 day, with flank pain. She also noticed some blood. She is having pain on her right lower abdomen with strap stabbing pain. She rates 10/10. She took Tylenol on yesterday. This was effective she can tell when it wears off.  She reports that she drinks water but she does not drink enough. She drinks more during the night. She denies fever, chills, or abnormal vaginal discharge or bleeding.  She reports family history of recurrent UTI. She does endorse holding her urine at times.  She denies recurrent UTI last one many years ago.    Past Medical History:  Diagnosis Date   Allergy    Arthritis    Bradycardia    pacemaker placed in April 2018  [employer]   GERD (gastroesophageal reflux disease)    Hyperlipidemia    Hypertension    Seasonal allergies     Patient Active Problem List   Diagnosis Date Noted   Pacemaker 02/10/2019   Heart block, AV 10/29/2016   Mobitz type 2 second degree atrioventricular block 10/29/2016   Arthritis of shoulder 12/31/2012   HTN (hypertension) 10/22/2012   Pure hypercholesterolemia 10/22/2012    Past Surgical History:  Procedure Laterality Date   ABDOMINAL HYSTERECTOMY  1995   COLONOSCOPY  2013   [place]   bilateral for flat feet with pain   PACEMAKER IMPLANT N/A 10/29/2016   Procedure: Pacemaker Implant;  Surgeon: Evans Lance, MD;  Location: [facility];  Service: Cardiovascular;  Laterality: N/A;   POLYPECTOMY     TONSILLECTOMY  as child   TOTAL SHOULDER ARTHROPLASTY Right 12/30/2012   Procedure: RIGHT TOTAL SHOULDER ARTHROPLASTY;  Surgeon: Nita Sells, MD;  Location: [facility];  Service: Orthopedics;  Laterality: Right;   interscaline block   WISDOM TOOTH EXTRACTION      OB History   No obstetric history on file.      Home Medications    Prior to Admission medications   Medication Sig Start Date End Date Taking? Authorizing Provider  aspirin 81 MG tablet Take 81 mg by mouth daily. Patient not taking: Reported on 09/17/2021    [provider]  Calcium Carbonate-Vitamin D (CALCIUM 600 + D PO) Take 1 tablet by mouth daily.    [provider]  carvedilol (COREG) 6.25 MG tablet Take 1 tablet (6.25 mg total) by mouth 2 (two) times daily with a meal. 07/05/19   Evans Lance, MD  cetirizine (ZYRTEC) 10 MG tablet Take 10 mg by mouth daily.    [provider]  cholecalciferol (VITAMIN D-400) 400 UNITS TABS Take 400 Units by mouth daily.    [provider]  Cod Liver Oil 1000 MG CAPS Take 1 capsule by mouth daily.    [provider]  Enema Mineral Oil ENEM Place 1 application rectally daily. 01/22/18   Wurst, [place], PA-C  fenofibrate 160 MG tablet Take 1 tablet by mouth daily. 02/06/19   [provider]  Garlic 10 MG CAPS Take 1 tablet by mouth daily.    [provider]  guaiFENesin (MUCINEX) 600 MG 12 hr tablet Take 600 mg by  mouth 2 (two) times daily.    [provider]  ibuprofen (ADVIL,MOTRIN) 200 MG tablet Take 400 mg by mouth every 6 (six) hours as needed for mild pain.    [provider]  irbesartan (AVAPRO) 150 MG tablet Take 1 tablet (150 mg total) by mouth daily. 02/10/19   Evans Lance, MD  meclizine (ANTIVERT) 12.5 MG tablet Take 1 tablet (12.5 mg total) by mouth 3 (three) times daily as needed for dizziness. Patient not taking: Reported on 09/17/2021 03/04/20   Domingo Dimes, PA-C  methotrexate 2.5 MG tablet Take 15 mg by mouth once a week. 08/14/21   [provider]  naproxen sodium (ALEVE) 220 MG tablet Take 220 mg by mouth.    [provider]  pantoprazole (PROTONIX) 20 MG tablet Take 1 tablet  (20 mg total) by mouth daily. 08/26/20   Hazel Sams, PA-C  polyethylene glycol (MIRALAX / GLYCOLAX) packet Take 17 g by mouth daily. 01/22/18   Wurst, [place], PA-C  predniSONE (DELTASONE) 5 MG tablet Take 5 mg by mouth daily. 08/19/21   [provider]  vitamin C (ASCORBIC ACID) 500 MG tablet Take 500 mg by mouth daily.    [provider]  omeprazole (PRILOSEC) 20 MG capsule Take 20 mg by mouth daily.  08/26/20  [provider]    Family History Family History  Problem Relation Age of Onset   Dementia Mother    Colon cancer Maternal Aunt 65   Colon cancer Cousin 50       maternal   Rectal cancer Neg Hx    Stomach cancer Neg Hx    Colon polyps Neg Hx    Esophageal cancer Neg Hx     Social History Social History   Tobacco Use   Smoking status: Every Day    Packs/day: 0.25    Types: Cigarettes   Smokeless tobacco: Never  Vaping Use   Vaping Use: Never used  Substance Use Topics   Alcohol use: No   Drug use: No     Allergies   Macrobid [nitrofurantoin macrocrystal]   Review of Systems Review of Systems  Constitutional: Negative.   HENT: Negative.    Eyes: Negative.   Respiratory: Negative.    Cardiovascular: Negative.   Endocrine: Negative.   Musculoskeletal: Negative.   Skin: Negative.   Allergic/Immunologic: Negative.   Hematological: Negative.   Psychiatric/Behavioral: Negative.       Physical Exam Triage Vital Signs ED Triage Vitals  Enc Vitals Group     BP 01/06/22 1224 127/71     Pulse Rate 01/06/22 1224 79     Resp 01/06/22 1224 18     Temp 01/06/22 1224 98.2 F (36.8 C)     Temp src --      SpO2 01/06/22 1224 99 %     Weight --      Height --      Head Circumference --      Peak Flow --      Pain Score 01/06/22 1225 10     Pain Loc --      Pain Edu? --      Excl. in [place]? --    No data found.  Updated Vital Signs BP 127/71   Pulse 79   Temp 98.2 F (36.8 C)   Resp 18   SpO2 99%   Visual Acuity Right  Eye Distance:   Left Eye Distance:   Bilateral Distance:    Right Eye  Near:   Left Eye Near:    Bilateral Near:     Physical Exam Constitutional:      General: She is in acute distress.     Appearance: She is normal weight. She is not ill-appearing, toxic-appearing or diaphoretic.  Cardiovascular:     Rate and Rhythm: Normal rate and regular rhythm.     Heart sounds: Normal heart sounds.  Pulmonary:     Effort: Pulmonary effort is normal.  Abdominal:     General: Bowel sounds are normal. There is no distension or abdominal bruit. There are no signs of injury.     Palpations: Abdomen is soft.     Tenderness: There is abdominal tenderness in the suprapubic area.  Skin:    General: Skin is warm and dry.     Capillary Refill: Capillary refill takes less than 2 seconds.  Neurological:     General: No focal deficit present.     Mental Status: She is alert.  Psychiatric:        Mood and Affect: Mood normal.        Behavior: Behavior normal.     OBJECTIVE: Appears well, in no apparent distress.  Vital signs are normal. The abdomen is soft without tenderness, guarding, mass, rebound or organomegaly. No CVA tenderness or inguinal adenopathy noted. Urine dipstick shows positive for RBC's and positive for leukocytes.  Micro exam: not done.  UC Treatments / Results  Labs (all labs ordered are listed, but only abnormal results are displayed) Labs Reviewed  POCT URINALYSIS DIPSTICK, ED / UC    EKG   Radiology No results found.  Procedures Procedures (including critical care time)  Medications Ordered in UC Medications - No data to display  Initial Impression / Assessment and Plan / UC Course  I have reviewed the triage vital signs and the nursing notes.  Pertinent labs & imaging results that were available during my care of the patient were reviewed by me and considered in my medical decision making (see chart for details).   ASSESSMENT: UTI uncomplicated without evidence  of pyelonephritis Keflex 500 mg TID x 7 days   PLAN: Treatment per orders - also push fluids, may use Pyridium OTC prn. Call or return to clinic prn if these symptoms worsen or fail to improve as anticipated.  Final Clinical Impressions(s) / UC Diagnoses   Final diagnoses:  None   Discharge Instructions   None    ED Prescriptions   None    PDMP not reviewed this encounter.   Dionisio David [place], NP 01/06/22 1359

## 2022-01-08 LAB — URINE CULTURE: Culture: >=100000 — AB

## 2022-01-10 ENCOUNTER — Encounter (HOSPITAL_COMMUNITY): Payer: Self-pay

## 2022-01-10 ENCOUNTER — Ambulatory Visit (HOSPITAL_COMMUNITY)
Admission: RE | Admit: 2022-01-10 | Discharge: 2022-01-10 | Disposition: A | Discharge status: Home / Self Care | Payer: Medicare PPO | Source: Ambulatory Visit | Attending: Student | Admitting: Student

## 2022-01-10 VITALS — BP 181/75 | HR 62 | Temp 98.2°F | Resp 17

## 2022-01-10 DIAGNOSIS — N3001 Acute cystitis with hematuria: Secondary | ICD-10-CM | POA: Diagnosis not present

## 2022-01-10 LAB — POCT URINALYSIS DIPSTICK, ED / UC
Bilirubin Urine: NEGATIVE
Glucose, UA: NEGATIVE mg/dL
Ketones, ur: NEGATIVE mg/dL
Leukocytes,Ua: NEGATIVE
Nitrite: NEGATIVE
Protein, ur: NEGATIVE mg/dL
Specific Gravity, Urine: 1.015 (ref 1.005–1.030)
Urobilinogen, UA: 0.2 mg/dL (ref 0.0–1.0)
pH: 6 (ref 5.0–8.0)

## 2022-01-10 MED ORDER — CIPROFLOXACIN HCL 500 MG PO TABS: 500.0000 mg | ORAL_TABLET | Freq: Two times a day (BID) | ORAL | 0 refills | Status: AC

## 2022-01-10 NOTE — ED Provider Notes (Signed)
[place]    CSN: [id number] Arrival date & time: 01/10/22  1710      History   Chief Complaint Chief Complaint  Patient presents with   Dysuria   Back Pain    HPI Denise Hodges is a 76 y.o. female presenting with dysuria for about 1 week.  History hysterectomy.  Describes dysuria and suprapubic pressure.  She states she experiences this all the time, not just with urination.  She completed the Keflex as directed, and culture indicated at this was appropriate treatment.  She also endorses new onset of lower back pain, worse with movement; there is no flank pain.  Denies incontinence, vaginitis, vaginal discharge, vaginal irritation, vaginal rash or lesion, fever/chills.  HPI  Past Medical History:  Diagnosis Date   Allergy    Arthritis    Bradycardia    pacemaker placed in April 2018  [employer]   GERD (gastroesophageal reflux disease)    Hyperlipidemia    Hypertension    Seasonal allergies     Patient Active Problem List   Diagnosis Date Noted   Pacemaker 02/10/2019   Heart block, AV 10/29/2016   Mobitz type 2 second degree atrioventricular block 10/29/2016   Arthritis of shoulder 12/31/2012   HTN (hypertension) 10/22/2012   Pure hypercholesterolemia 10/22/2012    Past Surgical History:  Procedure Laterality Date   ABDOMINAL HYSTERECTOMY  1995   COLONOSCOPY  2013   [place]   bilateral for flat feet with pain   PACEMAKER IMPLANT N/A 10/29/2016   Procedure: Pacemaker Implant;  Surgeon: Evans Lance, MD;  Location: [facility];  Service: Cardiovascular;  Laterality: N/A;   POLYPECTOMY     TONSILLECTOMY  as child   TOTAL SHOULDER ARTHROPLASTY Right 12/30/2012   Procedure: RIGHT TOTAL SHOULDER ARTHROPLASTY;  Surgeon: Nita Sells, MD;  Location: [facility];  Service: Orthopedics;  Laterality: Right;  interscaline block   WISDOM TOOTH EXTRACTION      OB History   No obstetric history on file.      Home  Medications    Prior to Admission medications   Medication Sig Start Date End Date Taking? Authorizing Provider  ciprofloxacin (CIPRO) 500 MG tablet Take 1 tablet (500 mg total) by mouth 2 (two) times daily for 5 days. 01/10/22 01/15/22 Yes Hazel Sams, PA-C  aspirin 81 MG tablet Take 81 mg by mouth daily. Patient not taking: Reported on 09/17/2021    [provider]  Calcium Carbonate-Vitamin D (CALCIUM 600 + D PO) Take 1 tablet by mouth daily.    [provider]  carvedilol (COREG) 6.25 MG tablet Take 1 tablet (6.25 mg total) by mouth 2 (two) times daily with a meal. 07/05/19   Evans Lance, MD  cephALEXin (KEFLEX) 500 MG capsule Take 1 capsule (500 mg total) by mouth 3 (three) times daily for 7 days. 01/06/22 01/13/22  Vevelyn Francois, NP  cetirizine (ZYRTEC) 10 MG tablet Take 10 mg by mouth daily.    [provider]  cholecalciferol (VITAMIN D-400) 400 UNITS TABS Take 400 Units by mouth daily.    [provider]  Cod Liver Oil 1000 MG CAPS Take 1 capsule by mouth daily.    [provider]  Enema Mineral Oil ENEM Place 1 application rectally daily. 01/22/18   Wurst, [place], PA-C  fenofibrate 160 MG tablet Take 1 tablet by mouth daily. 02/06/19   [provider]  Garlic 10 MG  CAPS Take 1 tablet by mouth daily.    [provider]  guaiFENesin (MUCINEX) 600 MG 12 hr tablet Take 600 mg by mouth 2 (two) times daily.    [provider]  ibuprofen (ADVIL,MOTRIN) 200 MG tablet Take 400 mg by mouth every 6 (six) hours as needed for mild pain.    [provider]  irbesartan (AVAPRO) 150 MG tablet Take 1 tablet (150 mg total) by mouth daily. 02/10/19   Evans Lance, MD  meclizine (ANTIVERT) 12.5 MG tablet Take 1 tablet (12.5 mg total) by mouth 3 (three) times daily as needed for dizziness. Patient not taking: Reported on 09/17/2021 03/04/20   Domingo Dimes, PA-C  methotrexate 2.5 MG tablet Take 15 mg by mouth once a  week. 08/14/21   [provider]  naproxen sodium (ALEVE) 220 MG tablet Take 220 mg by mouth.    [provider]  pantoprazole (PROTONIX) 20 MG tablet Take 1 tablet (20 mg total) by mouth daily. 08/26/20   Hazel Sams, PA-C  phenazopyridine (PYRIDIUM) 200 MG tablet Take 1 tablet (200 mg total) by mouth 3 (three) times daily. 01/06/22   Vevelyn Francois, NP  polyethylene glycol (MIRALAX / GLYCOLAX) packet Take 17 g by mouth daily. 01/22/18   Wurst, [place], PA-C  predniSONE (DELTASONE) 5 MG tablet Take 5 mg by mouth daily. 08/19/21   [provider]  vitamin C (ASCORBIC ACID) 500 MG tablet Take 500 mg by mouth daily.    [provider]  omeprazole (PRILOSEC) 20 MG capsule Take 20 mg by mouth daily.  08/26/20  [provider]    Family History Family History  Problem Relation Age of Onset   Dementia Mother    Colon cancer Maternal Aunt 65   Colon cancer Cousin 73       maternal   Rectal cancer Neg Hx    Stomach cancer Neg Hx    Colon polyps Neg Hx    Esophageal cancer Neg Hx     Social History Social History   Tobacco Use   Smoking status: Every Day    Packs/day: 0.25    Types: Cigarettes   Smokeless tobacco: Never  Vaping Use   Vaping Use: Never used  Substance Use Topics   Alcohol use: No   Drug use: No     Allergies   Macrobid [nitrofurantoin macrocrystal]   Review of Systems Review of Systems  Constitutional:  Negative for appetite change, chills, diaphoresis and fever.  Respiratory:  Negative for shortness of breath.   Cardiovascular:  Negative for chest pain.  Gastrointestinal:  Negative for abdominal pain, blood in stool, constipation, diarrhea, nausea and vomiting.  Genitourinary:  Positive for dysuria. Negative for decreased urine volume, difficulty urinating, flank pain, frequency, genital sores, hematuria and urgency.  Musculoskeletal:  Negative for back pain.  Neurological:  Negative for dizziness, weakness and  light-headedness.  All other systems reviewed and are negative.    Physical Exam Triage Vital Signs ED Triage Vitals  Enc Vitals Group     BP 01/10/22 1742 (!) 181/75     Pulse Rate 01/10/22 1742 62     Resp 01/10/22 1742 17     Temp 01/10/22 1742 98.2 F (36.8 C)     Temp Source 01/10/22 1742 Oral     SpO2 --      Weight --      Height --      Head Circumference --  Peak Flow --      Pain Score 01/10/22 1740 10     Pain Loc --      Pain Edu? --      Excl. in [place]? --    No data found.  Updated Vital Signs BP (!) 181/75 (BP Location: Left Arm)   Pulse 62   Temp 98.2 F (36.8 C) (Oral)   Resp 17   Visual Acuity Right Eye Distance:   Left Eye Distance:   Bilateral Distance:    Right Eye Near:   Left Eye Near:    Bilateral Near:     Physical Exam Vitals reviewed.  Constitutional:      General: She is not in acute distress.    Appearance: Normal appearance. She is not ill-appearing.  HENT:     Head: Normocephalic and atraumatic.     Mouth/Throat:     Mouth: Mucous membranes are moist.     Comments: Moist mucous membranes Eyes:     Extraocular Movements: Extraocular movements intact.     Pupils: Pupils are equal, round, and reactive to light.  Cardiovascular:     Rate and Rhythm: Normal rate and regular rhythm.     Heart sounds: Normal heart sounds.  Pulmonary:     Effort: Pulmonary effort is normal.     Breath sounds: Normal breath sounds. No wheezing, rhonchi or rales.  Abdominal:     General: Bowel sounds are normal. There is no distension.     Palpations: Abdomen is soft. There is no mass.     Tenderness: There is abdominal tenderness in the suprapubic area. There is no right CVA tenderness, left CVA tenderness, guarding or rebound. Negative signs include Murphy's sign, Rovsing's sign and McBurney's sign.     Comments: No guarding or rebound No flank pain   Skin:    General: Skin is warm.     Capillary Refill: Capillary refill takes less than 2  seconds.     Comments: Good skin turgor  Neurological:     General: No focal deficit present.     Mental Status: She is alert and oriented to person, place, and time.  Psychiatric:        Mood and Affect: Mood normal.        Behavior: Behavior normal.      UC Treatments / Results  Labs (all labs ordered are listed, but only abnormal results are displayed) Labs Reviewed  POCT URINALYSIS DIPSTICK, ED / UC - Abnormal; Notable for the following components:      Result Value   Hgb urine dipstick TRACE (*)    All other components within normal limits  URINE CULTURE    EKG   Radiology No results found.  Procedures Procedures (including critical care time)  Medications Ordered in UC Medications - No data to display  Initial Impression / Assessment and Plan / UC Course  I have reviewed the triage vital signs and the nursing notes.  Pertinent labs & imaging results that were available during my care of the patient were reviewed by me and considered in my medical decision making (see chart for details).     This patient is a very pleasant 76 y.o. year old female presenting with dysuria and suprapubic pressure. Afebrile, nontachy. There is reproducible suprapubic pressure but no flank pain; the "lower back pain" is actually lumbar strain. She was treated for UTI on 01/06/22 with keflex; culture showed this was appropriate; patient states symptoms are only getting worse. UA  today with trace blood, negative nitrite and leuk; culture sent. There is no flank pain or radiation of pain to the groin, no gross hematuria; low concern for nephrolithiasis or obstructed stone. Discussed that the UA is actually reassuring and she may be dealing with OAB or IC or similar rather than true cystitis. She prefers to treat. Patient is specifically requesting bactrim for management which has worked in the past, but there is an interaction with her methotrexate. Will proceed with ciprofloxacin x5 days. Good  hydration. F/u with PCP for further concerns, or ED if symptoms worsen. She has a history of hysterectomy.   Final Clinical Impressions(s) / UC Diagnoses   Final diagnoses:  Acute cystitis with hematuria     Discharge Instructions      -Ciprofloxacin twice daily x5 days  -Drink plenty of fluids -Follow-up with PCP if symptoms persist, or ED if they worsen like severe abd pain    ED Prescriptions     Medication Sig Dispense Auth. Provider   ciprofloxacin (CIPRO) 500 MG tablet Take 1 tablet (500 mg total) by mouth 2 (two) times daily for 5 days. 10 tablet Hazel Sams, PA-C      PDMP not reviewed this encounter.   Hazel Sams, PA-C 01/10/22 1826

## 2022-01-10 NOTE — Discharge Instructions (Addendum)
-  Ciprofloxacin twice daily x5 days  -Drink plenty of fluids -Follow-up with PCP if symptoms persist, or ED if they worsen like severe abd pain

## 2022-01-10 NOTE — ED Triage Notes (Signed)
Pt reports seen Monday for dysuria. Reports taking medications as prescribed but symptoms are not any better. Now having lower back pain

## 2022-01-12 LAB — URINE CULTURE: Culture: NO GROWTH

## 2022-01-14 ENCOUNTER — Telehealth (HOSPITAL_COMMUNITY): Payer: Self-pay | Admitting: Emergency Medicine

## 2022-01-14 MED ORDER — FLUCONAZOLE 150 MG PO TABS: 150.0000 mg | ORAL_TABLET | Freq: Once | ORAL | 0 refills | Status: AC

## 2022-01-16 DIAGNOSIS — E782 Mixed hyperlipidemia: Secondary | ICD-10-CM | POA: Diagnosis not present

## 2022-01-16 DIAGNOSIS — I10 Essential (primary) hypertension: Secondary | ICD-10-CM | POA: Diagnosis not present

## 2022-01-16 DIAGNOSIS — K219 Gastro-esophageal reflux disease without esophagitis: Secondary | ICD-10-CM | POA: Diagnosis not present

## 2022-01-20 ENCOUNTER — Ambulatory Visit (INDEPENDENT_AMBULATORY_CARE_PROVIDER_SITE_OTHER): Payer: Medicare PPO

## 2022-01-20 DIAGNOSIS — I4430 Unspecified atrioventricular block: Secondary | ICD-10-CM | POA: Diagnosis not present

## 2022-01-22 LAB — CUP PACEART REMOTE DEVICE CHECK
Battery Remaining Longevity: 40 mo
Battery Remaining Percentage: 41 %
Battery Voltage: 2.96 V
Brady Statistic AP VP Percent: 4 %
Brady Statistic AP VS Percent: 1.5 %
Brady Statistic AS VP Percent: 71 %
Brady Statistic AS VS Percent: 23 %
Brady Statistic RA Percent Paced: 5.2 %
Brady Statistic RV Percent Paced: 75 %
Date Time Interrogation Session: 20230626020030
Implantable Lead Implant Date: 20180404
Implantable Lead Implant Date: 20180404
Implantable Lead Location: 753859
Implantable Lead Location: 753860
Implantable Lead Model: 3830
Implantable Pulse Generator Implant Date: 20180404
Lead Channel Impedance Value: 530 Ohm
Lead Channel Impedance Value: 560 Ohm
Lead Channel Pacing Threshold Amplitude: 0.5 V
Lead Channel Pacing Threshold Amplitude: 0.5 V
Lead Channel Pacing Threshold Pulse Width: 0.4 ms
Lead Channel Pacing Threshold Pulse Width: 1 ms
Lead Channel Sensing Intrinsic Amplitude: 12 mV
Lead Channel Sensing Intrinsic Amplitude: 4.3 mV
Lead Channel Setting Pacing Amplitude: 2 V
Lead Channel Setting Pacing Amplitude: 2.5 V
Lead Channel Setting Pacing Pulse Width: 1 ms
Lead Channel Setting Sensing Sensitivity: 2 mV
Pulse Gen Model: 2272
Pulse Gen Serial Number: 8002284

## 2022-02-07 DIAGNOSIS — M0579 Rheumatoid arthritis with rheumatoid factor of multiple sites without organ or systems involvement: Secondary | ICD-10-CM | POA: Diagnosis not present

## 2022-02-07 DIAGNOSIS — Z79899 Other long term (current) drug therapy: Secondary | ICD-10-CM | POA: Diagnosis not present

## 2022-02-11 DIAGNOSIS — H04123 Dry eye syndrome of bilateral lacrimal glands: Secondary | ICD-10-CM | POA: Diagnosis not present

## 2022-02-11 DIAGNOSIS — H5713 Ocular pain, bilateral: Secondary | ICD-10-CM | POA: Diagnosis not present

## 2022-02-12 NOTE — Progress Notes (Signed)
Remote pacemaker transmission.   

## 2022-02-17 DIAGNOSIS — E782 Mixed hyperlipidemia: Secondary | ICD-10-CM | POA: Diagnosis not present

## 2022-02-17 DIAGNOSIS — K219 Gastro-esophageal reflux disease without esophagitis: Secondary | ICD-10-CM | POA: Diagnosis not present

## 2022-02-17 DIAGNOSIS — I10 Essential (primary) hypertension: Secondary | ICD-10-CM | POA: Diagnosis not present

## 2022-03-18 DIAGNOSIS — M1991 Primary osteoarthritis, unspecified site: Secondary | ICD-10-CM | POA: Diagnosis not present

## 2022-03-18 DIAGNOSIS — M25521 Pain in right elbow: Secondary | ICD-10-CM | POA: Diagnosis not present

## 2022-03-18 DIAGNOSIS — Z6822 Body mass index (BMI) 22.0-22.9, adult: Secondary | ICD-10-CM | POA: Diagnosis not present

## 2022-03-18 DIAGNOSIS — R110 Nausea: Secondary | ICD-10-CM | POA: Diagnosis not present

## 2022-03-18 DIAGNOSIS — M0579 Rheumatoid arthritis with rheumatoid factor of multiple sites without organ or systems involvement: Secondary | ICD-10-CM | POA: Diagnosis not present

## 2022-04-21 ENCOUNTER — Ambulatory Visit (INDEPENDENT_AMBULATORY_CARE_PROVIDER_SITE_OTHER): Payer: Medicare PPO

## 2022-04-21 DIAGNOSIS — I4430 Unspecified atrioventricular block: Secondary | ICD-10-CM | POA: Diagnosis not present

## 2022-04-22 LAB — CUP PACEART REMOTE DEVICE CHECK
Battery Remaining Longevity: 37 mo
Battery Remaining Percentage: 38 %
Battery Voltage: 2.96 V
Brady Statistic AP VP Percent: 4.4 %
Brady Statistic AP VS Percent: 1.5 %
Brady Statistic AS VP Percent: 72 %
Brady Statistic AS VS Percent: 22 %
Brady Statistic RA Percent Paced: 5.3 %
Brady Statistic RV Percent Paced: 76 %
Date Time Interrogation Session: 20230925020014
Implantable Lead Implant Date: 20180404
Implantable Lead Implant Date: 20180404
Implantable Lead Location: 753859
Implantable Lead Location: 753860
Implantable Lead Model: 3830
Implantable Pulse Generator Implant Date: 20180404
Lead Channel Impedance Value: 510 Ohm
Lead Channel Impedance Value: 590 Ohm
Lead Channel Pacing Threshold Amplitude: 0.5 V
Lead Channel Pacing Threshold Amplitude: 0.5 V
Lead Channel Pacing Threshold Pulse Width: 0.4 ms
Lead Channel Pacing Threshold Pulse Width: 1 ms
Lead Channel Sensing Intrinsic Amplitude: 12 mV
Lead Channel Sensing Intrinsic Amplitude: 4.7 mV
Lead Channel Setting Pacing Amplitude: 2 V
Lead Channel Setting Pacing Amplitude: 2.5 V
Lead Channel Setting Pacing Pulse Width: 1 ms
Lead Channel Setting Sensing Sensitivity: 2 mV
Pulse Gen Model: 2272
Pulse Gen Serial Number: 8002284

## 2022-04-28 DIAGNOSIS — K219 Gastro-esophageal reflux disease without esophagitis: Secondary | ICD-10-CM | POA: Diagnosis not present

## 2022-04-28 DIAGNOSIS — I10 Essential (primary) hypertension: Secondary | ICD-10-CM | POA: Diagnosis not present

## 2022-04-28 DIAGNOSIS — E782 Mixed hyperlipidemia: Secondary | ICD-10-CM | POA: Diagnosis not present

## 2022-04-28 DIAGNOSIS — G4700 Insomnia, unspecified: Secondary | ICD-10-CM | POA: Diagnosis not present

## 2022-05-09 NOTE — Progress Notes (Signed)
Remote pacemaker transmission.   

## 2022-05-29 ENCOUNTER — Encounter (HOSPITAL_COMMUNITY): Payer: Self-pay

## 2022-05-29 ENCOUNTER — Other Ambulatory Visit: Payer: Self-pay

## 2022-05-29 ENCOUNTER — Ambulatory Visit (HOSPITAL_COMMUNITY)
Admission: RE | Admit: 2022-05-29 | Discharge: 2022-05-29 | Disposition: A | Discharge status: Home / Self Care | Payer: Medicare PPO | Source: Ambulatory Visit | Attending: Internal Medicine | Admitting: Internal Medicine

## 2022-05-29 VITALS — BP 135/68 | HR 68 | Temp 98.5°F | Resp 20

## 2022-05-29 DIAGNOSIS — S70361A Insect bite (nonvenomous), right thigh, initial encounter: Secondary | ICD-10-CM | POA: Diagnosis not present

## 2022-05-29 DIAGNOSIS — W57XXXA Bitten or stung by nonvenomous insect and other nonvenomous arthropods, initial encounter: Secondary | ICD-10-CM

## 2022-05-29 MED ORDER — AQUAPHOR EX OINT: TOPICAL_OINTMENT | CUTANEOUS | 0 refills | Status: AC | PRN

## 2022-05-29 NOTE — Discharge Instructions (Signed)
Apply Aquaphor ointment to the wounds twice daily and cover with Band-Aid to provide moisture to the area to help wound healing.  You may do warm Epsom salt soaks in the bathtub to further help heal the area.  If the area is still bothering you in 2 weeks, please call your primary care provider for a follow-up appointment.  If the area begins to drain, become more red, or if the area begins to swell significantly, please return to urgent care for reevaluation or go to your primary care provider sooner than 2 weeks.  I hope you feel better!

## 2022-05-29 NOTE — ED Triage Notes (Signed)
Patient noticed "bite" or " sting" on 10/6 or 10/13.area is located to posterior, right thigh

## 2022-05-29 NOTE — ED Provider Notes (Signed)
[place]    CSN: [id number] Arrival date & time: 05/29/22  0930      History   Chief Complaint Chief Complaint  Patient presents with   Appointment    9:30   Insect Bite    HPI Denise Hodges is a 76 y.o. female.   Patient presents urgent care for evaluation of suspected insect bites to the right upper posterior thigh/lower buttock area that she states happened on Friday, May 09, 2022 (3 weeks ago) while she was sitting on her sister's porch.  She states that every time she sits on her sister's porch, she comes home with insect bites of some type.  She was wearing long pants and believes that an insect of unknown type crawled up her pant leg, bit her, then with squished.  She is concerned that she may have been stung by a yellow jacket and is concerned that there may be a retained stinger to the wound.  She is also concerned that she may have been bitten by spider.  Denies recent tick bites, exposure to heavily wooded areas, fever/chills, nausea, vomiting, abdominal pain, body aches, drainage/bleeding from the site, and soft tissue swelling surrounding site.  The bug bite sites do not itch but rather are slightly uncomfortable and strange feeling. She has been placing neosporin ointment to the bites and states that they continue to feel "rough".      Past Medical History:  Diagnosis Date   Allergy    Arthritis    Bradycardia    pacemaker placed in April 2018  [employer]   GERD (gastroesophageal reflux disease)    Hyperlipidemia    Hypertension    Seasonal allergies     Patient Active Problem List   Diagnosis Date Noted   Pacemaker 02/10/2019   Heart block, AV 10/29/2016   Mobitz type 2 second degree atrioventricular block 10/29/2016   Arthritis of shoulder 12/31/2012   HTN (hypertension) 10/22/2012   Pure hypercholesterolemia 10/22/2012    Past Surgical History:  Procedure Laterality Date   ABDOMINAL HYSTERECTOMY  1995   COLONOSCOPY  2013   [place]   bilateral for flat feet with pain   PACEMAKER IMPLANT N/A 10/29/2016   Procedure: Pacemaker Implant;  Surgeon: Evans Lance, MD;  Location: [facility];  Service: Cardiovascular;  Laterality: N/A;   POLYPECTOMY     TONSILLECTOMY  as child   TOTAL SHOULDER ARTHROPLASTY Right 12/30/2012   Procedure: RIGHT TOTAL SHOULDER ARTHROPLASTY;  Surgeon: Nita Sells, MD;  Location: [facility];  Service: Orthopedics;  Laterality: Right;  interscaline block   WISDOM TOOTH EXTRACTION      OB History   No obstetric history on file.      Home Medications    Prior to Admission medications   Medication Sig Start Date End Date Taking? Authorizing Provider  mineral oil-hydrophilic petrolatum (AQUAPHOR) ointment Apply topically as needed for dry skin. 05/29/22  Yes Talbot Grumbling, FNP  aspirin 81 MG tablet Take 81 mg by mouth daily. Patient not taking: Reported on 09/17/2021    [provider]  Calcium Carbonate-Vitamin D (CALCIUM 600 + D PO) Take 1 tablet by mouth daily.    [provider]  carvedilol (COREG) 6.25 MG tablet Take 1 tablet (6.25 mg total) by mouth 2 (two) times daily with a meal. 07/05/19   Evans Lance, MD  cetirizine (ZYRTEC) 10 MG tablet Take 10 mg by mouth daily.  [provider]  cholecalciferol (VITAMIN D-400) 400 UNITS TABS Take 400 Units by mouth daily.    [provider]  Cod Liver Oil 1000 MG CAPS Take 1 capsule by mouth daily.    [provider]  Enema Mineral Oil ENEM Place 1 application rectally daily. 01/22/18   Wurst, [place], PA-C  fenofibrate 160 MG tablet Take 1 tablet by mouth daily. 02/06/19   [provider]  Garlic 10 MG CAPS Take 1 tablet by mouth daily.    [provider]  guaiFENesin (MUCINEX) 600 MG 12 hr tablet Take 600 mg by mouth 2 (two) times daily. Patient not taking: Reported on 05/29/2022    [provider]  ibuprofen (ADVIL,MOTRIN) 200 MG  tablet Take 400 mg by mouth every 6 (six) hours as needed for mild pain.    [provider]  irbesartan (AVAPRO) 150 MG tablet Take 1 tablet (150 mg total) by mouth daily. 02/10/19   Evans Lance, MD  meclizine (ANTIVERT) 12.5 MG tablet Take 1 tablet (12.5 mg total) by mouth 3 (three) times daily as needed for dizziness. Patient not taking: Reported on 09/17/2021 03/04/20   Domingo Dimes, PA-C  methotrexate 2.5 MG tablet Take 15 mg by mouth once a week. 08/14/21   [provider]  naproxen sodium (ALEVE) 220 MG tablet Take 220 mg by mouth.    [provider]  pantoprazole (PROTONIX) 20 MG tablet Take 1 tablet (20 mg total) by mouth daily. 08/26/20   Hazel Sams, PA-C  phenazopyridine (PYRIDIUM) 200 MG tablet Take 1 tablet (200 mg total) by mouth 3 (three) times daily. 01/06/22   Vevelyn Francois, NP  polyethylene glycol (MIRALAX / GLYCOLAX) packet Take 17 g by mouth daily. 01/22/18   Wurst, [place], PA-C  predniSONE (DELTASONE) 5 MG tablet Take 5 mg by mouth daily. Patient not taking: Reported on 05/29/2022 08/19/21   [provider]  vitamin C (ASCORBIC ACID) 500 MG tablet Take 500 mg by mouth daily.    [provider]  omeprazole (PRILOSEC) 20 MG capsule Take 20 mg by mouth daily.  08/26/20  [provider]    Family History Family History  Problem Relation Age of Onset   Dementia Mother    Colon cancer Maternal Aunt 67   Colon cancer Cousin 63       maternal   Rectal cancer Neg Hx    Stomach cancer Neg Hx    Colon polyps Neg Hx    Esophageal cancer Neg Hx     Social History Social History   Tobacco Use   Smoking status: Every Day    Packs/day: 0.25    Types: Cigarettes   Smokeless tobacco: Never  Vaping Use   Vaping Use: Never used  Substance Use Topics   Alcohol use: No   Drug use: No     Allergies   Macrobid [nitrofurantoin macrocrystal]   Review of Systems Review of Systems Per HPI  Physical Exam Triage  Vital Signs ED Triage Vitals  Enc Vitals Group     BP 05/29/22 0954 135/68     Pulse Rate 05/29/22 0954 68     Resp 05/29/22 0954 20     Temp 05/29/22 0954 98.5 F (36.9 C)     Temp Source 05/29/22 0954 Oral     SpO2 05/29/22 0954 98 %     Weight --      Height --      Head Circumference --  Peak Flow --      Pain Score 05/29/22 0951 0     Pain Loc --      Pain Edu? --      Excl. in [place]? --    No data found.  Updated Vital Signs BP 135/68 (BP Location: Right Arm)   Pulse 68   Temp 98.5 F (36.9 C) (Oral)   Resp 20   SpO2 98%   Visual Acuity Right Eye Distance:   Left Eye Distance:   Bilateral Distance:    Right Eye Near:   Left Eye Near:    Bilateral Near:     Physical Exam Vitals and nursing note reviewed.  Constitutional:      Appearance: She is not ill-appearing or toxic-appearing.  HENT:     Head: Normocephalic and atraumatic.     Right Ear: Hearing and external ear normal.     Left Ear: Hearing and external ear normal.     Nose: Nose normal.     Mouth/Throat:     Lips: Pink.  Eyes:     General: Lids are normal. Vision grossly intact. Gaze aligned appropriately.     Extraocular Movements: Extraocular movements intact.     Conjunctiva/sclera: Conjunctivae normal.  Pulmonary:     Effort: Pulmonary effort is normal.  Musculoskeletal:     Cervical back: Neck supple.  Skin:    General: Skin is warm and dry.     Capillary Refill: Capillary refill takes less than 2 seconds.     Findings: Lesion present. No rash.       Neurological:     General: No focal deficit present.     Mental Status: She is alert and oriented to person, place, and time. Mental status is at baseline.     Cranial Nerves: No dysarthria or facial asymmetry.  Psychiatric:        Mood and Affect: Mood normal.        Speech: Speech normal.        Behavior: Behavior normal.        Thought Content: Thought content normal.        Judgment: Judgment normal.      UC Treatments /  Results  Labs (all labs ordered are listed, but only abnormal results are displayed) Labs Reviewed - No data to display  EKG   Radiology No results found.  Procedures Procedures (including critical care time)  Medications Ordered in UC Medications - No data to display  Initial Impression / Assessment and Plan / UC Course  I have reviewed the triage vital signs and the nursing notes.  Pertinent labs & imaging results that were available during my care of the patient were reviewed by me and considered in my medical decision making (see chart for details).   1.  Insect bite of right thigh Insect bites do not appear to be infected and appear to be well-healing.  The rough area that patient is describing is due to scar tissue forming from healing process.  There is no indication for oral antibiotic therapy at this time.  She is neurovascularly intact distal to the injury.  She may perform Epsom salt soaks to the area and place Aquaphor to the bites to promote a moist wound bed for proper healing.  She may do the Aquaphor twice daily and cover this with a Band-Aid.  If symptoms have not improved in the next 2 weeks, she has been advised to schedule an appointment with her primary care  provider for further evaluation of this. She voices agreement with plan.  Discussed physical exam and available lab work findings in clinic with patient.  Counseled patient regarding appropriate use of medications and potential side effects for all medications recommended or prescribed today. Discussed red flag signs and symptoms of worsening condition,when to call the PCP office, return to urgent care, and when to seek higher level of care in the emergency department. Patient verbalizes understanding and agreement with plan. All questions answered. Patient discharged in stable condition.   Final Clinical Impressions(s) / UC Diagnoses   Final diagnoses:  Insect bite of right thigh, initial encounter      Discharge Instructions      Apply Aquaphor ointment to the wounds twice daily and cover with Band-Aid to provide moisture to the area to help wound healing.  You may do warm Epsom salt soaks in the bathtub to further help heal the area.  If the area is still bothering you in 2 weeks, please call your primary care provider for a follow-up appointment.  If the area begins to drain, become more red, or if the area begins to swell significantly, please return to urgent care for reevaluation or go to your primary care provider sooner than 2 weeks.  I hope you feel better!      ED Prescriptions     Medication Sig Dispense Auth. Provider   mineral oil-hydrophilic petrolatum (AQUAPHOR) ointment Apply topically as needed for dry skin. 50 g Talbot Grumbling, FNP      PDMP not reviewed this encounter.   Talbot Grumbling, [place] 05/29/22 1047

## 2022-06-02 DIAGNOSIS — D649 Anemia, unspecified: Secondary | ICD-10-CM | POA: Diagnosis not present

## 2022-06-02 DIAGNOSIS — Z86010 Personal history of colonic polyps: Secondary | ICD-10-CM | POA: Diagnosis not present

## 2022-06-02 DIAGNOSIS — K2100 Gastro-esophageal reflux disease with esophagitis, without bleeding: Secondary | ICD-10-CM | POA: Diagnosis not present

## 2022-06-16 DIAGNOSIS — I10 Essential (primary) hypertension: Secondary | ICD-10-CM | POA: Diagnosis not present

## 2022-06-16 DIAGNOSIS — G4700 Insomnia, unspecified: Secondary | ICD-10-CM | POA: Diagnosis not present

## 2022-06-16 DIAGNOSIS — I700 Atherosclerosis of aorta: Secondary | ICD-10-CM | POA: Diagnosis not present

## 2022-06-16 DIAGNOSIS — E782 Mixed hyperlipidemia: Secondary | ICD-10-CM | POA: Diagnosis not present

## 2022-06-16 DIAGNOSIS — M059 Rheumatoid arthritis with rheumatoid factor, unspecified: Secondary | ICD-10-CM | POA: Diagnosis not present

## 2022-06-24 DIAGNOSIS — Z79899 Other long term (current) drug therapy: Secondary | ICD-10-CM | POA: Diagnosis not present

## 2022-06-24 DIAGNOSIS — M0579 Rheumatoid arthritis with rheumatoid factor of multiple sites without organ or systems involvement: Secondary | ICD-10-CM | POA: Diagnosis not present

## 2022-07-22 ENCOUNTER — Ambulatory Visit (INDEPENDENT_AMBULATORY_CARE_PROVIDER_SITE_OTHER): Payer: Medicare PPO

## 2022-07-22 DIAGNOSIS — I4430 Unspecified atrioventricular block: Secondary | ICD-10-CM | POA: Diagnosis not present

## 2022-07-23 LAB — CUP PACEART REMOTE DEVICE CHECK
Battery Remaining Longevity: 35 mo
Battery Remaining Percentage: 35 %
Battery Voltage: 2.95 V
Brady Statistic AP VP Percent: 4.8 %
Brady Statistic AP VS Percent: 1.4 %
Brady Statistic AS VP Percent: 72 %
Brady Statistic AS VS Percent: 21 %
Brady Statistic RA Percent Paced: 5.4 %
Brady Statistic RV Percent Paced: 77 %
Date Time Interrogation Session: 20231226020012
Implantable Lead Connection Status: 753985
Implantable Lead Connection Status: 753985
Implantable Lead Implant Date: 20180404
Implantable Lead Implant Date: 20180404
Implantable Lead Location: 753859
Implantable Lead Location: 753860
Implantable Lead Model: 3830
Implantable Pulse Generator Implant Date: 20180404
Lead Channel Impedance Value: 580 Ohm
Lead Channel Impedance Value: 580 Ohm
Lead Channel Pacing Threshold Amplitude: 0.5 V
Lead Channel Pacing Threshold Amplitude: 0.5 V
Lead Channel Pacing Threshold Pulse Width: 0.4 ms
Lead Channel Pacing Threshold Pulse Width: 1 ms
Lead Channel Sensing Intrinsic Amplitude: 12 mV
Lead Channel Sensing Intrinsic Amplitude: 4.5 mV
Lead Channel Setting Pacing Amplitude: 2 V
Lead Channel Setting Pacing Amplitude: 2.5 V
Lead Channel Setting Pacing Pulse Width: 1 ms
Lead Channel Setting Sensing Sensitivity: 2 mV
Pulse Gen Model: 2272
Pulse Gen Serial Number: 8002284

## 2022-07-24 ENCOUNTER — Ambulatory Visit
Admission: EM | Admit: 2022-07-24 | Discharge: 2022-07-24 | Disposition: A | Discharge status: Home / Self Care | Payer: Medicare PPO | Attending: Family Medicine | Admitting: Family Medicine

## 2022-07-24 ENCOUNTER — Encounter: Payer: Self-pay | Admitting: Emergency Medicine

## 2022-07-24 ENCOUNTER — Other Ambulatory Visit: Payer: Self-pay

## 2022-07-24 DIAGNOSIS — J069 Acute upper respiratory infection, unspecified: Secondary | ICD-10-CM | POA: Insufficient documentation

## 2022-07-24 DIAGNOSIS — Z1152 Encounter for screening for COVID-19: Secondary | ICD-10-CM | POA: Insufficient documentation

## 2022-07-24 DIAGNOSIS — Z792 Long term (current) use of antibiotics: Secondary | ICD-10-CM | POA: Diagnosis not present

## 2022-07-24 DIAGNOSIS — H6691 Otitis media, unspecified, right ear: Secondary | ICD-10-CM | POA: Diagnosis not present

## 2022-07-24 MED ORDER — AMOXICILLIN 875 MG PO TABS: 875.0000 mg | ORAL_TABLET | Freq: Two times a day (BID) | ORAL | 0 refills | Status: AC

## 2022-07-24 NOTE — ED Triage Notes (Signed)
Pt here for cough x 3 days with some body aches

## 2022-07-24 NOTE — ED Provider Notes (Signed)
[facility]    CSN: [id number] Arrival date & time: 07/24/22  1006      History   Chief Complaint Chief Complaint  Patient presents with   Cough    HPI Denise Hodges is a 76 y.o. female.    Cough  Here for nasal congestion and drainage and cough.  Symptoms began on December 25.  Then yesterday she started having a lot of right ear pain.  No fever or chills.  No shortness of breath.  There is no nausea or vomiting or diarrhea. She does have a history of rheumatoid arthritis and takes methotrexate   Past Medical History:  Diagnosis Date   Allergy    Arthritis    Bradycardia    pacemaker placed in April 2018  [employer]tronic   GERD (gastroesophageal reflux disease)    Hyperlipidemia    Hypertension    Seasonal allergies     Patient Active Problem List   Diagnosis Date Noted   Pacemaker 02/10/2019   Heart block, AV 10/29/2016   Mobitz type 2 second degree atrioventricular block 10/29/2016   Arthritis of shoulder 12/31/2012   HTN (hypertension) 10/22/2012   Pure hypercholesterolemia 10/22/2012    Past Surgical History:  Procedure Laterality Date   ABDOMINAL HYSTERECTOMY  1995   COLONOSCOPY  2013   [place]   bilateral for flat feet with pain   PACEMAKER IMPLANT N/A 10/29/2016   Procedure: Pacemaker Implant;  Surgeon: Evans Lance, MD;  Location: [facility];  Service: Cardiovascular;  Laterality: N/A;   POLYPECTOMY     TONSILLECTOMY  as child   TOTAL SHOULDER ARTHROPLASTY Right 12/30/2012   Procedure: RIGHT TOTAL SHOULDER ARTHROPLASTY;  Surgeon: Nita Sells, MD;  Location: [facility];  Service: Orthopedics;  Laterality: Right;  interscaline block   WISDOM TOOTH EXTRACTION      OB History   No obstetric history on file.      Home Medications    Prior to Admission medications   Medication Sig Start Date End Date Taking? Authorizing Provider  amoxicillin (AMOXIL) 875 MG tablet Take 1 tablet (875 mg total) by  mouth 2 (two) times daily for 7 days. 07/24/22 07/31/22 Yes Barrett Henle, MD  aspirin 81 MG tablet Take 81 mg by mouth daily. Patient not taking: Reported on 09/17/2021    [provider]  Calcium Carbonate-Vitamin D (CALCIUM 600 + D PO) Take 1 tablet by mouth daily.    [provider]  carvedilol (COREG) 6.25 MG tablet Take 1 tablet (6.25 mg total) by mouth 2 (two) times daily with a meal. 07/05/19   Evans Lance, MD  cetirizine (ZYRTEC) 10 MG tablet Take 10 mg by mouth daily.    [provider]  cholecalciferol (VITAMIN D-400) 400 UNITS TABS Take 400 Units by mouth daily.    [provider]  Cod Liver Oil 1000 MG CAPS Take 1 capsule by mouth daily.    [provider]  Enema Mineral Oil ENEM Place 1 application rectally daily. 01/22/18   Wurst, [place], PA-C  fenofibrate 160 MG tablet Take 1 tablet by mouth daily. 02/06/19   [provider]  Garlic 10 MG CAPS Take 1 tablet by mouth daily.    [provider]  ibuprofen (ADVIL,MOTRIN) 200 MG tablet Take 400 mg by mouth every 6 (six) hours as needed for mild pain.    [provider]  irbesartan (AVAPRO) 150 MG tablet Take 1  tablet (150 mg total) by mouth daily. 02/10/19   Evans Lance, MD  methotrexate 2.5 MG tablet Take 15 mg by mouth once a week. 08/14/21   [provider]  mineral oil-hydrophilic petrolatum (AQUAPHOR) ointment Apply topically as needed for dry skin. 05/29/22   Talbot Grumbling, FNP  naproxen sodium (ALEVE) 220 MG tablet Take 220 mg by mouth.    [provider]  pantoprazole (PROTONIX) 20 MG tablet Take 1 tablet (20 mg total) by mouth daily. 08/26/20   Hazel Sams, PA-C  polyethylene glycol (MIRALAX / GLYCOLAX) packet Take 17 g by mouth daily. 01/22/18   Wurst, [place], PA-C  vitamin C (ASCORBIC ACID) 500 MG tablet Take 500 mg by mouth daily.    [provider]  omeprazole (PRILOSEC) 20 MG capsule Take 20 mg by mouth  daily.  08/26/20  [provider]    Family History Family History  Problem Relation Age of Onset   Dementia Mother    Colon cancer Maternal Aunt 90   Colon cancer Cousin 80       maternal   Rectal cancer Neg Hx    Stomach cancer Neg Hx    Colon polyps Neg Hx    Esophageal cancer Neg Hx     Social History Social History   Tobacco Use   Smoking status: Every Day    Packs/day: 0.25    Types: Cigarettes   Smokeless tobacco: Never  Vaping Use   Vaping Use: Never used  Substance Use Topics   Alcohol use: No   Drug use: No     Allergies   Macrobid [nitrofurantoin macrocrystal]   Review of Systems Review of Systems  Respiratory:  Positive for cough.      Physical Exam Triage Vital Signs ED Triage Vitals  Enc Vitals Group     BP 07/24/22 1119 (!) 169/78     Pulse Rate 07/24/22 1119 78     Resp 07/24/22 1119 18     Temp 07/24/22 1119 98.3 F (36.8 C)     Temp Source 07/24/22 1119 Oral     SpO2 07/24/22 1119 95 %     Weight --      Height --      Head Circumference --      Peak Flow --      Pain Score 07/24/22 1120 4     Pain Loc --      Pain Edu? --      Excl. in [place]? --    No data found.  Updated Vital Signs BP (!) 169/78 (BP Location: Left Arm)   Pulse 78   Temp 98.3 F (36.8 C) (Oral)   Resp 18   SpO2 95%   Visual Acuity Right Eye Distance:   Left Eye Distance:   Bilateral Distance:    Right Eye Near:   Left Eye Near:    Bilateral Near:     Physical Exam Vitals reviewed.  Constitutional:      General: She is not in acute distress.    Appearance: She is not ill-appearing, toxic-appearing or diaphoretic.  HENT:     Right Ear: Ear canal normal.     Left Ear: Tympanic membrane and ear canal normal.     Ears:     Comments: Right tympanic membrane is red and dull.  It is partially obscured by cerumen    Nose: Congestion present.     Mouth/Throat:     Mouth: Mucous membranes are moist.  Comments: There is clear mucus draining  in the oropharynx. Eyes:     Extraocular Movements: Extraocular movements intact.     Conjunctiva/sclera: Conjunctivae normal.     Pupils: Pupils are equal, round, and reactive to light.  Cardiovascular:     Rate and Rhythm: Normal rate and regular rhythm.     Heart sounds: No murmur heard. Pulmonary:     Effort: No respiratory distress.     Breath sounds: No stridor. No wheezing, rhonchi or rales.  Musculoskeletal:     Cervical back: Neck supple.  Lymphadenopathy:     Cervical: No cervical adenopathy.  Skin:    Capillary Refill: Capillary refill takes less than 2 seconds.     Coloration: Skin is not jaundiced or pale.  Neurological:     General: No focal deficit present.     Mental Status: She is alert and oriented to person, place, and time.  Psychiatric:        Behavior: Behavior normal.      UC Treatments / Results  Labs (all labs ordered are listed, but only abnormal results are displayed) Labs Reviewed  SARS CORONAVIRUS 2 (TAT 6-24 HRS)    EKG   Radiology No results found.  Procedures Procedures (including critical care time)  Medications Ordered in UC Medications - No data to display  Initial Impression / Assessment and Plan / UC Course  I have reviewed the triage vital signs and the nursing notes.  Pertinent labs & imaging results that were available during my care of the patient were reviewed by me and considered in my medical decision making (see chart for details).        I am going to treat the otitis media with amoxicillin.  She is swabbed for COVID, and if positive I would count tomorrow is day 5.  She would be a candidate for renal dosing of Paxlovid.  Her last EGFR in 2019 was greater than 60, but I do not have any more recent lab values. Final Clinical Impressions(s) / UC Diagnoses   Final diagnoses:  Viral URI  Right otitis media, unspecified otitis media type     Discharge Instructions      Take amoxicillin 875 mg--1 tab twice  daily for 7 days   You have been swabbed for COVID, and the test will result in the next 24 hours. Our staff will call you if positive. If the COVID test is positive, you should quarantine for 5 days from the start of your symptoms       ED Prescriptions     Medication Sig Dispense Auth. Provider   amoxicillin (AMOXIL) 875 MG tablet Take 1 tablet (875 mg total) by mouth 2 (two) times daily for 7 days. 14 tablet Zayvon Alicea, Gwenlyn Perking, MD      PDMP not reviewed this encounter.   Barrett Henle, MD 07/24/22 [phone]

## 2022-07-24 NOTE — Discharge Instructions (Signed)
Take amoxicillin 875 mg--1 tab twice daily for 7 days   You have been swabbed for COVID, and the test will result in the next 24 hours. Our staff will call you if positive. If the COVID test is positive, you should quarantine for 5 days from the start of your symptoms

## 2022-07-25 LAB — SARS CORONAVIRUS 2 (TAT 6-24 HRS): SARS Coronavirus 2: NEGATIVE

## 2022-08-09 ENCOUNTER — Encounter (HOSPITAL_COMMUNITY): Payer: Self-pay

## 2022-08-09 ENCOUNTER — Ambulatory Visit (HOSPITAL_COMMUNITY)
Admission: EM | Admit: 2022-08-09 | Discharge: 2022-08-09 | Disposition: A | Discharge status: Home / Self Care | Payer: Medicare PPO | Attending: Physician Assistant | Admitting: Physician Assistant

## 2022-08-09 ENCOUNTER — Ambulatory Visit (INDEPENDENT_AMBULATORY_CARE_PROVIDER_SITE_OTHER): Payer: Medicare PPO

## 2022-08-09 DIAGNOSIS — R059 Cough, unspecified: Secondary | ICD-10-CM

## 2022-08-09 DIAGNOSIS — J329 Chronic sinusitis, unspecified: Secondary | ICD-10-CM | POA: Diagnosis not present

## 2022-08-09 DIAGNOSIS — J40 Bronchitis, not specified as acute or chronic: Secondary | ICD-10-CM | POA: Diagnosis not present

## 2022-08-09 DIAGNOSIS — R5383 Other fatigue: Secondary | ICD-10-CM | POA: Diagnosis not present

## 2022-08-09 MED ORDER — PREDNISONE 20 MG PO TABS: 40.0000 mg | ORAL_TABLET | Freq: Every day | ORAL | 0 refills | Status: AC

## 2022-08-09 MED ORDER — BENZONATATE 100 MG PO CAPS: 100.0000 mg | ORAL_CAPSULE | Freq: Three times a day (TID) | ORAL | 0 refills | Status: DC

## 2022-08-09 MED ORDER — CEFDINIR 300 MG PO CAPS: 300.0000 mg | ORAL_CAPSULE | Freq: Two times a day (BID) | ORAL | 0 refills | Status: DC

## 2022-08-09 NOTE — ED Triage Notes (Signed)
Pt is here for nasal congestion , chest congestion, cough, runny nose, fatigue , loss appetite x 16days

## 2022-08-09 NOTE — ED Provider Notes (Signed)
Denise Hodges    CSN: [id number] Arrival date & time: 08/09/22  1059      History   Chief Complaint Chief Complaint  Patient presents with   Cough   Otalgia    HPI Denise Hodges is a 77 y.o. female.   Patient presents today with a greater than 2-week history of URI symptoms including congestion, sinus pressure, rhinorrhea, cough.  She reports associated fatigue and malaise.  Denies any chest pain, shortness of breath, nausea, vomiting, diarrhea.  She reports symptoms began right around Christmas and she was seen by our urgent care on 07/24/2022 at which point she was given plain amoxicillin 875 mg twice daily for 7 days.  Reports completing course of medication without improvement of symptoms.  She denies additional antibiotics since that time.  She does have a history of seasonal allergies managed with cetirizine daily.  She denies any history of smoking, asthma, COPD.  Denies history of diabetes.    Past Medical History:  Diagnosis Date   Allergy    Arthritis    Bradycardia    pacemaker placed in April 2018  [employer]tr[employer]   GERD (gastroesophageal reflux disease)    Hyperlipidemia    Hypertension    Seasonal allergies     Patient Active Problem List   Diagnosis Date Noted   Pacemaker 02/10/2019   Heart block, AV 10/29/2016   Mobitz type 2 second degree atrioventricular block 10/29/2016   Arthritis of shoulder 12/31/2012   HTN (hypertension) 10/22/2012   Pure hypercholesterolemia 10/22/2012    Past Surgical History:  Procedure Laterality Date   ABDOMINAL HYSTERECTOMY  1995   COLONOSCOPY  2013   [place]   bilateral for flat feet with pain   PACEMAKER IMPLANT N/A 10/29/2016   Procedure: Pacemaker Implant;  Surgeon: Evans Lance, MD;  Location: [facility];  Service: Cardiovascular;  Laterality: N/A;   POLYPECTOMY     TONSILLECTOMY  as child   TOTAL SHOULDER ARTHROPLASTY Right 12/30/2012   Procedure: RIGHT TOTAL SHOULDER  ARTHROPLASTY;  Surgeon: Nita Sells, MD;  Location: [facility];  Service: Orthopedics;  Laterality: Right;  interscaline block   WISDOM TOOTH EXTRACTION      OB History   No obstetric history on file.      Home Medications    Prior to Admission medications   Medication Sig Start Date End Date Taking? Authorizing Provider  benzonatate (TESSALON) 100 MG capsule Take 1 capsule (100 mg total) by mouth every 8 (eight) hours. 08/09/22  Yes Tashira Torre K, PA-C  cefdinir (OMNICEF) 300 MG capsule Take 1 capsule (300 mg total) by mouth 2 (two) times daily. 08/09/22  Yes Aarohi Redditt K, PA-C  predniSONE (DELTASONE) 20 MG tablet Take 2 tablets (40 mg total) by mouth daily for 4 days. 08/09/22 08/13/22 Yes Chiann Goffredo, Derry Skill, PA-C  aspirin 81 MG tablet Take 81 mg by mouth daily. Patient not taking: Reported on 09/17/2021    [provider]  Calcium Carbonate-Vitamin D (CALCIUM 600 + D PO) Take 1 tablet by mouth daily.    [provider]  carvedilol (COREG) 6.25 MG tablet Take 1 tablet (6.25 mg total) by mouth 2 (two) times daily with a meal. 07/05/19   Evans Lance, MD  cetirizine (ZYRTEC) 10 MG tablet Take 10 mg by mouth daily.    [provider]  cholecalciferol (VITAMIN D-400) 400 UNITS TABS Take 400 Units by mouth daily.  [provider]  Cod Liver Oil 1000 MG CAPS Take 1 capsule by mouth daily.    [provider]  Enema Mineral Oil ENEM Place 1 application rectally daily. 01/22/18   Wurst, [place], PA-C  fenofibrate 160 MG tablet Take 1 tablet by mouth daily. 02/06/19   [provider]  Garlic 10 MG CAPS Take 1 tablet by mouth daily.    [provider]  ibuprofen (ADVIL,MOTRIN) 200 MG tablet Take 400 mg by mouth every 6 (six) hours as needed for mild pain.    [provider]  irbesartan (AVAPRO) 150 MG tablet Take 1 tablet (150 mg total) by mouth daily. 02/10/19   Evans Lance, MD  methotrexate 2.5 MG tablet Take 15 mg  by mouth once a week. 08/14/21   [provider]  mineral oil-hydrophilic petrolatum (AQUAPHOR) ointment Apply topically as needed for dry skin. 05/29/22   Talbot Grumbling, FNP  naproxen sodium (ALEVE) 220 MG tablet Take 220 mg by mouth.    [provider]  pantoprazole (PROTONIX) 20 MG tablet Take 1 tablet (20 mg total) by mouth daily. 08/26/20   Hazel Sams, PA-C  polyethylene glycol (MIRALAX / GLYCOLAX) packet Take 17 g by mouth daily. 01/22/18   Wurst, [place], PA-C  vitamin C (ASCORBIC ACID) 500 MG tablet Take 500 mg by mouth daily.    [provider]  omeprazole (PRILOSEC) 20 MG capsule Take 20 mg by mouth daily.  08/26/20  [provider]    Family History Family History  Problem Relation Age of Onset   Dementia Mother    Colon cancer Maternal Aunt 41   Colon cancer Cousin 50       maternal   Rectal cancer Neg Hx    Stomach cancer Neg Hx    Colon polyps Neg Hx    Esophageal cancer Neg Hx     Social History Social History   Tobacco Use   Smoking status: Every Day    Packs/day: 0.25    Types: Cigarettes   Smokeless tobacco: Never  Vaping Use   Vaping Use: Never used  Substance Use Topics   Alcohol use: No   Drug use: No     Allergies   Macrobid [nitrofurantoin macrocrystal]   Review of Systems Review of Systems  Constitutional:  Positive for activity change and fatigue. Negative for appetite change and fever.  HENT:  Positive for congestion, postnasal drip and sinus pressure. Negative for sneezing and sore throat.   Respiratory:  Positive for cough. Negative for shortness of breath.   Cardiovascular:  Negative for chest pain.  Gastrointestinal:  Negative for abdominal pain, diarrhea, nausea and vomiting.  Neurological:  Positive for headaches. Negative for dizziness and light-headedness.     Physical Exam Triage Vital Signs ED Triage Vitals  Enc Vitals Group     BP 08/09/22 1216 128/68     Pulse Rate 08/09/22  1216 67     Resp 08/09/22 1216 16     Temp 08/09/22 1216 98 F (36.7 C)     Temp Source 08/09/22 1216 Oral     SpO2 08/09/22 1216 97 %     Weight --      Height --      Head Circumference --      Peak Flow --      Pain Score 08/09/22 1215 0     Pain Loc --      Pain Edu? --  Excl. in [place]? --    No data found.  Updated Vital Signs BP 128/68 (BP Location: Right Arm)   Pulse 67   Temp 98 F (36.7 C) (Oral)   Resp 16   SpO2 97%   Visual Acuity Right Eye Distance:   Left Eye Distance:   Bilateral Distance:    Right Eye Near:   Left Eye Near:    Bilateral Near:     Physical Exam Vitals reviewed.  Constitutional:      General: She is awake. She is not in acute distress.    Appearance: Normal appearance. She is well-developed. She is not ill-appearing.     Comments: Very pleasant female appears stated age in no acute distress laying on exam room table   HENT:     Head: Normocephalic and atraumatic.     Right Ear: Tympanic membrane, ear canal and external ear normal. Tympanic membrane is not erythematous or bulging.     Left Ear: Tympanic membrane, ear canal and external ear normal. Tympanic membrane is not erythematous or bulging.     Nose:     Right Sinus: Maxillary sinus tenderness present. No frontal sinus tenderness.     Left Sinus: Maxillary sinus tenderness present. No frontal sinus tenderness.     Mouth/Throat:     Pharynx: Uvula midline. No oropharyngeal exudate or posterior oropharyngeal erythema.  Cardiovascular:     Rate and Rhythm: Normal rate and regular rhythm.     Heart sounds: Normal heart sounds, S1 normal and S2 normal. No murmur heard. Pulmonary:     Effort: Pulmonary effort is normal.     Breath sounds: Normal breath sounds. No wheezing, rhonchi or rales.     Comments: Clear auscultation bilaterally Psychiatric:        Behavior: Behavior is cooperative.      UC Treatments / Results  Labs (all labs ordered are listed, but only abnormal  results are displayed) Labs Reviewed - No data to display  EKG   Radiology DG Chest 2 View  Result Date: 08/09/2022 CLINICAL DATA:  Worsening cough. Chest congestion, fatigue, runny nose and diminished appetite. EXAM: CHEST - 2 VIEW COMPARISON:  Radiograph 10/30/2016 FINDINGS: Left-sided pacemaker in place with lead tips projecting over the right atrium and ventricle. Heart is normal in size with stable mediastinal contours. No pulmonary edema, acute airspace disease, pleural effusion or pneumothorax. Right humeral arthroplasty partially visualized. No acute osseous findings. IMPRESSION: 1. No acute chest findings. 2. Left-sided pacemaker in place. Electronically Signed   By: Keith Rake M.D.   On: 08/09/2022 13:10    Procedures Procedures (including critical care time)  Medications Ordered in UC Medications - No data to display  Initial Impression / Assessment and Plan / UC Course  I have reviewed the triage vital signs and the nursing notes.  Pertinent labs & imaging results that were available during my care of the patient were reviewed by me and considered in my medical decision making (see chart for details).     Patient is well-appearing, afebrile, nontoxic, nontachycardic.  No indication for viral testing as she has been symptomatic for several weeks and this would not change management.  Chest x-ray was obtained that showed no acute cardiopulmonary disease.  Given prolonged and worsening symptoms will cover for secondary bacterial infection.  Given her recent use of amoxicillin we will switch to cefdinir.  She was instructed to use this twice a day for 10 days.  Recommended over-the-counter medication including Mucinex, Flonase,  sinus rinses, nasal saline.  She is to rest and drink plenty of fluid.  She was prescribed prednisone burst of 40 mg for 4 days with instruction not to take NSAIDs with this medication.  She was given a prescription for Tessalon to help manage her cough.   Discussed that if she fails a another antibiotic and continues to have significant symptoms I would recommend following up with ENT as she may benefit from a sinus CT which we cannot arrange in urgent care.  She was given contact information for local ENT office with instruction to call to schedule an appointment.  Discussed that if she has any worsening or changing symptoms including worsening cough, fever, nausea/vomiting interfere with oral intake, weakness she needs to be seen immediately.  Strict return precautions given.  Final Clinical Impressions(s) / UC Diagnoses   Final diagnoses:  Sinobronchitis     Discharge Instructions      Your x-ray was normal with no evidence of pneumonia which is great news.  I am concerned that you have an ongoing sinus infection.  Start cefdinir twice daily for 10 days.  I recommend you continue over-the-counter medications including Mucinex and Flonase.  You should also use nasal saline/sinus rinses for additional symptom relief.  Take prednisone 40 mg for 4 days.  Do not take NSAIDs with this medication including aspirin, ibuprofen/Advil, naproxen/Aleve.  Use Tessalon for cough.  Make sure that you are resting and drinking plenty of fluid.  If your symptoms or not improving after the second course of antibiotics I do recommend you follow-up with an ENT; call them to schedule an appointment.  If anything worsens and develop worsening cough, shortness of breath, fever, nausea/vomiting interfrontal intake you need to be seen immediately.     ED Prescriptions     Medication Sig Dispense Auth. Provider   cefdinir (OMNICEF) 300 MG capsule Take 1 capsule (300 mg total) by mouth 2 (two) times daily. 20 capsule Ridwan Bondy K, PA-C   predniSONE (DELTASONE) 20 MG tablet Take 2 tablets (40 mg total) by mouth daily for 4 days. 8 tablet Cartier Washko K, PA-C   benzonatate (TESSALON) 100 MG capsule Take 1 capsule (100 mg total) by mouth every 8 (eight) hours. 21 capsule  Tillman Kazmierski K, PA-C      PDMP not reviewed this encounter.   Terrilee Croak, PA-C 08/09/22 1319

## 2022-08-09 NOTE — Discharge Instructions (Signed)
Your x-ray was normal with no evidence of pneumonia which is great news.  I am concerned that you have an ongoing sinus infection.  Start cefdinir twice daily for 10 days.  I recommend you continue over-the-counter medications including Mucinex and Flonase.  You should also use nasal saline/sinus rinses for additional symptom relief.  Take prednisone 40 mg for 4 days.  Do not take NSAIDs with this medication including aspirin, ibuprofen/Advil, naproxen/Aleve.  Use Tessalon for cough.  Make sure that you are resting and drinking plenty of fluid.  If your symptoms or not improving after the second course of antibiotics I do recommend you follow-up with an ENT; call them to schedule an appointment.  If anything worsens and develop worsening cough, shortness of breath, fever, nausea/vomiting interfrontal intake you need to be seen immediately.

## 2022-08-18 NOTE — Progress Notes (Signed)
Remote pacemaker transmission.   

## 2022-09-01 DIAGNOSIS — R110 Nausea: Secondary | ICD-10-CM | POA: Diagnosis not present

## 2022-09-01 DIAGNOSIS — M0579 Rheumatoid arthritis with rheumatoid factor of multiple sites without organ or systems involvement: Secondary | ICD-10-CM | POA: Diagnosis not present

## 2022-09-01 DIAGNOSIS — M1991 Primary osteoarthritis, unspecified site: Secondary | ICD-10-CM | POA: Diagnosis not present

## 2022-09-01 DIAGNOSIS — M25521 Pain in right elbow: Secondary | ICD-10-CM | POA: Diagnosis not present

## 2022-09-01 DIAGNOSIS — K219 Gastro-esophageal reflux disease without esophagitis: Secondary | ICD-10-CM | POA: Diagnosis not present

## 2022-09-01 DIAGNOSIS — Z6820 Body mass index (BMI) 20.0-20.9, adult: Secondary | ICD-10-CM | POA: Diagnosis not present

## 2022-09-04 DIAGNOSIS — R1013 Epigastric pain: Secondary | ICD-10-CM | POA: Diagnosis not present

## 2022-09-04 DIAGNOSIS — K219 Gastro-esophageal reflux disease without esophagitis: Secondary | ICD-10-CM | POA: Diagnosis not present

## 2022-09-04 DIAGNOSIS — K5900 Constipation, unspecified: Secondary | ICD-10-CM | POA: Diagnosis not present

## 2022-09-04 DIAGNOSIS — Z86010 Personal history of colonic polyps: Secondary | ICD-10-CM | POA: Diagnosis not present

## 2022-10-15 DIAGNOSIS — Z86010 Personal history of colonic polyps: Secondary | ICD-10-CM | POA: Diagnosis not present

## 2022-10-15 DIAGNOSIS — K5730 Diverticulosis of large intestine without perforation or abscess without bleeding: Secondary | ICD-10-CM | POA: Diagnosis not present

## 2022-10-15 DIAGNOSIS — Z09 Encounter for follow-up examination after completed treatment for conditions other than malignant neoplasm: Secondary | ICD-10-CM | POA: Diagnosis not present

## 2022-10-15 DIAGNOSIS — D123 Benign neoplasm of transverse colon: Secondary | ICD-10-CM | POA: Diagnosis not present

## 2022-10-15 DIAGNOSIS — K6389 Other specified diseases of intestine: Secondary | ICD-10-CM | POA: Diagnosis not present

## 2022-10-15 DIAGNOSIS — K3189 Other diseases of stomach and duodenum: Secondary | ICD-10-CM | POA: Diagnosis not present

## 2022-10-15 DIAGNOSIS — D126 Benign neoplasm of colon, unspecified: Secondary | ICD-10-CM | POA: Diagnosis not present

## 2022-10-15 DIAGNOSIS — K648 Other hemorrhoids: Secondary | ICD-10-CM | POA: Diagnosis not present

## 2022-10-15 DIAGNOSIS — R1013 Epigastric pain: Secondary | ICD-10-CM | POA: Diagnosis not present

## 2022-10-15 DIAGNOSIS — D124 Benign neoplasm of descending colon: Secondary | ICD-10-CM | POA: Diagnosis not present

## 2022-10-15 DIAGNOSIS — D125 Benign neoplasm of sigmoid colon: Secondary | ICD-10-CM | POA: Diagnosis not present

## 2022-10-21 ENCOUNTER — Ambulatory Visit (INDEPENDENT_AMBULATORY_CARE_PROVIDER_SITE_OTHER): Payer: Medicare PPO

## 2022-10-21 DIAGNOSIS — I4430 Unspecified atrioventricular block: Secondary | ICD-10-CM

## 2022-10-21 LAB — CUP PACEART REMOTE DEVICE CHECK
Battery Remaining Longevity: 30 mo
Battery Remaining Percentage: 32 %
Battery Voltage: 2.95 V
Brady Statistic AP VP Percent: 5.1 %
Brady Statistic AP VS Percent: 1.3 %
Brady Statistic AS VP Percent: 73 %
Brady Statistic AS VS Percent: 20 %
Brady Statistic RA Percent Paced: 5.6 %
Brady Statistic RV Percent Paced: 78 %
Date Time Interrogation Session: 20240326020014
Implantable Lead Connection Status: 753985
Implantable Lead Connection Status: 753985
Implantable Lead Implant Date: 20180404
Implantable Lead Implant Date: 20180404
Implantable Lead Location: 753859
Implantable Lead Location: 753860
Implantable Lead Model: 3830
Implantable Pulse Generator Implant Date: 20180404
Lead Channel Impedance Value: 460 Ohm
Lead Channel Impedance Value: 560 Ohm
Lead Channel Pacing Threshold Amplitude: 0.5 V
Lead Channel Pacing Threshold Amplitude: 0.5 V
Lead Channel Pacing Threshold Pulse Width: 0.4 ms
Lead Channel Pacing Threshold Pulse Width: 1 ms
Lead Channel Sensing Intrinsic Amplitude: 12 mV
Lead Channel Sensing Intrinsic Amplitude: 4.3 mV
Lead Channel Setting Pacing Amplitude: 2 V
Lead Channel Setting Pacing Amplitude: 2.5 V
Lead Channel Setting Pacing Pulse Width: 1 ms
Lead Channel Setting Sensing Sensitivity: 2 mV
Pulse Gen Model: 2272
Pulse Gen Serial Number: 8002284

## 2022-10-23 DIAGNOSIS — Z1231 Encounter for screening mammogram for malignant neoplasm of breast: Secondary | ICD-10-CM | POA: Diagnosis not present

## 2022-11-07 ENCOUNTER — Ambulatory Visit (HOSPITAL_COMMUNITY)
Admission: EM | Admit: 2022-11-07 | Discharge: 2022-11-07 | Disposition: A | Discharge status: Home / Self Care | Payer: Medicare PPO | Attending: Emergency Medicine | Admitting: Emergency Medicine

## 2022-11-07 ENCOUNTER — Encounter (HOSPITAL_COMMUNITY): Payer: Self-pay

## 2022-11-07 ENCOUNTER — Ambulatory Visit (INDEPENDENT_AMBULATORY_CARE_PROVIDER_SITE_OTHER): Payer: Medicare PPO

## 2022-11-07 DIAGNOSIS — M25561 Pain in right knee: Secondary | ICD-10-CM

## 2022-11-07 DIAGNOSIS — S8991XA Unspecified injury of right lower leg, initial encounter: Secondary | ICD-10-CM

## 2022-11-07 DIAGNOSIS — Z043 Encounter for examination and observation following other accident: Secondary | ICD-10-CM | POA: Diagnosis not present

## 2022-11-07 DIAGNOSIS — W19XXXA Unspecified fall, initial encounter: Secondary | ICD-10-CM | POA: Diagnosis not present

## 2022-11-07 NOTE — Discharge Instructions (Signed)
Continue what you are doing!  If you have any worsening pain, swelling, trouble with moving the knee, please follow-up with an orthopedic specialist.  The walk-in clinic address is below

## 2022-11-07 NOTE — ED Triage Notes (Signed)
Pt presents to the office for right knee pain. Pt reports hearing a pop when she fell. Denies any LOC.

## 2022-11-07 NOTE — ED Provider Notes (Signed)
[facility]    CSN: [id number] Arrival date & time: 11/07/22  1732      History   Chief Complaint Chief Complaint  Patient presents with   Knee Pain    HPI Denise Hodges is a 77 y.o. female.  Reports she bumped her left knee on the bed frame and then fell onto it. Occurred 4 days ago It twisted beneath her and she heard a pop No pain today. No meds taken. Has been using ice, heat, elevating No prior injury known. She does have an ortho specialist   Past Medical History:  Diagnosis Date   Allergy    Arthritis    Bradycardia    pacemaker placed in April 2018  [employer]tr[employer]   GERD (gastroesophageal reflux disease)    Hyperlipidemia    Hypertension    Seasonal allergies     Patient Active Problem List   Diagnosis Date Noted   Pacemaker 02/10/2019   Heart block, AV 10/29/2016   Mobitz type 2 second degree atrioventricular block 10/29/2016   Arthritis of shoulder 12/31/2012   HTN (hypertension) 10/22/2012   Pure hypercholesterolemia 10/22/2012    Past Surgical History:  Procedure Laterality Date   ABDOMINAL HYSTERECTOMY  1995   COLONOSCOPY  2013   brodie   FOOT SURGERY  1985   bilateral for flat feet with pain   PACEMAKER IMPLANT N/A 10/29/2016   Procedure: Pacemaker Implant;  Surgeon: Marinus Maw, MD;  Location: [facility];  Service: Cardiovascular;  Laterality: N/A;   POLYPECTOMY     TONSILLECTOMY  as child   TOTAL SHOULDER ARTHROPLASTY Right 12/30/2012   Procedure: RIGHT TOTAL SHOULDER ARTHROPLASTY;  Surgeon: Mable Paris, MD;  Location: [facility];  Service: Orthopedics;  Laterality: Right;  interscaline block   WISDOM TOOTH EXTRACTION      OB History   No obstetric history on file.      Home Medications    Prior to Admission medications   Medication Sig Start Date End Date Taking? Authorizing Provider  aspirin 81 MG tablet Take 81 mg by mouth daily.   Yes [provider]  carvedilol (COREG) 6.25 MG tablet Take 1  tablet (6.25 mg total) by mouth 2 (two) times daily with a meal. 07/05/19  Yes Marinus Maw, MD  cetirizine (ZYRTEC) 10 MG tablet Take 10 mg by mouth daily.   Yes [provider]  benzonatate (TESSALON) 100 MG capsule Take 1 capsule (100 mg total) by mouth every 8 (eight) hours. 08/09/22   Raspet, Noberto Retort, PA-C  Calcium Carbonate-Vitamin D (CALCIUM 600 + D PO) Take 1 tablet by mouth daily.    [provider]  cefdinir (OMNICEF) 300 MG capsule Take 1 capsule (300 mg total) by mouth 2 (two) times daily. 08/09/22   Raspet, Noberto Retort, PA-C  cholecalciferol (VITAMIN D-400) 400 UNITS TABS Take 400 Units by mouth daily.    [provider]  Cod Liver Oil 1000 MG CAPS Take 1 capsule by mouth daily.    [provider]  Enema Mineral Oil ENEM Place 1 application rectally daily. 01/22/18   Wurst, [place], PA-C  fenofibrate 160 MG tablet Take 1 tablet by mouth daily. 02/06/19   [provider]  Garlic 10 MG CAPS Take 1 tablet by mouth daily.    [provider]  ibuprofen (ADVIL,MOTRIN) 200 MG tablet Take 400 mg by mouth every 6 (six) hours as needed for mild pain.    [provider]  irbesartan (AVAPRO) 150 MG tablet Take 1 tablet (150 mg total) by mouth daily. 02/10/19   Marinus Maw, MD  methotrexate 2.5 MG tablet Take 15 mg by mouth once a week. 08/14/21   [provider]  mineral oil-hydrophilic petrolatum (AQUAPHOR) ointment Apply topically as needed for dry skin. 05/29/22   Carlisle Beers, FNP  naproxen sodium (ALEVE) 220 MG tablet Take 220 mg by mouth.    [provider]  pantoprazole (PROTONIX) 20 MG tablet Take 1 tablet (20 mg total) by mouth daily. 08/26/20   Rhys Martini, PA-C  polyethylene glycol (MIRALAX / GLYCOLAX) packet Take 17 g by mouth daily. 01/22/18   Wurst, [place], PA-C  vitamin C (ASCORBIC ACID) 500 MG tablet Take 500 mg by mouth daily.    [provider]  omeprazole (PRILOSEC) 20 MG capsule  Take 20 mg by mouth daily.  08/26/20  [provider]    Family History Family History  Problem Relation Age of Onset   Dementia Mother    Colon cancer Maternal Aunt 64   Colon cancer Cousin 50       maternal   Rectal cancer Neg Hx    Stomach cancer Neg Hx    Colon polyps Neg Hx    Esophageal cancer Neg Hx     Social History Social History   Tobacco Use   Smoking status: Every Day    Packs/day: .25    Types: Cigarettes   Smokeless tobacco: Never  Vaping Use   Vaping Use: Never used  Substance Use Topics   Alcohol use: No   Drug use: No     Allergies   Macrobid [nitrofurantoin macrocrystal]   Review of Systems Review of Systems As per HPI  Physical Exam Triage Vital Signs ED Triage Vitals [11/07/22 1851]  Enc Vitals Group     BP (!) 141/78     Pulse Rate 67     Resp 18     Temp 97.9 F (36.6 C)     Temp Source Oral     SpO2 98 %     Weight      Height      Head Circumference      Peak Flow      Pain Score      Pain Loc      Pain Edu?      Excl. in GC?    No data found.  Updated Vital Signs BP (!) 141/78 (BP Location: Left Arm)   Pulse 67   Temp 97.9 F (36.6 C) (Oral)   Resp 18   SpO2 98%     Physical Exam Vitals and nursing note reviewed.  Constitutional:      General: She is not in acute distress. HENT:     Nose: No rhinorrhea.     Mouth/Throat:     Pharynx: Oropharynx is clear.  Eyes:     Conjunctiva/sclera: Conjunctivae normal.  Cardiovascular:     Rate and Rhythm: Normal rate and regular rhythm.     Pulses: Normal pulses.     Heart sounds: Normal heart sounds.  Pulmonary:     Effort: Pulmonary effort is normal.     Breath sounds: Normal breath sounds.  Musculoskeletal:     Cervical back: Normal range of motion.     Right knee: Normal. No swelling. Normal range of motion.     Left knee: Normal.     Comments: Full ROM at right knee. Some pain with full  extension.  Full range of motion of the right ankle.  Strength  5/5.  Cap refill less than 2 seconds, DP pulse strong. Sensation intact.   Skin:    General: Skin is warm and dry.  Neurological:     Mental Status: She is alert and oriented to person, place, and time.     UC Treatments / Results  Labs (all labs ordered are listed, but only abnormal results are displayed) Labs Reviewed - No data to display  EKG   Radiology DG Knee AP/LAT W/Sunrise Right  Result Date: 11/07/2022 CLINICAL DATA:  Fall, popping sensation EXAM: RIGHT KNEE 3 VIEWS COMPARISON:  None Available. FINDINGS: No fracture or malalignment. Mild medial joint space narrowing. No sizable knee effusion. IMPRESSION: Mild degenerative change. No acute osseous abnormality. Electronically Signed   By: Jasmine Pang M.D.   On: 11/07/2022 19:27    Procedures Procedures   Medications Ordered in UC Medications - No data to display  Initial Impression / Assessment and Plan / UC Course  I have reviewed the triage vital signs and the nursing notes.  Pertinent labs & imaging results that were available during my care of the patient were reviewed by me and considered in my medical decision making (see chart for details).  X-ray obtained is negative Discussed if she is having any worsening pain, swelling, decreased range of motion, I recommend that she follow-up with the orthopedic specialist.  Can continue ice, heat, elevate as needed.  Final Clinical Impressions(s) / UC Diagnoses   Final diagnoses:  Injury of right knee, initial encounter     Discharge Instructions      Continue what you are doing!  If you have any worsening pain, swelling, trouble with moving the knee, please follow-up with an orthopedic specialist.  The walk-in clinic address is below     ED Prescriptions   None    PDMP not reviewed this encounter.   Barney Gertsch, Ray Church 11/07/22 2005

## 2022-12-01 DIAGNOSIS — M0579 Rheumatoid arthritis with rheumatoid factor of multiple sites without organ or systems involvement: Secondary | ICD-10-CM | POA: Diagnosis not present

## 2022-12-02 NOTE — Progress Notes (Signed)
Remote pacemaker transmission.   

## 2022-12-31 DIAGNOSIS — E2839 Other primary ovarian failure: Secondary | ICD-10-CM | POA: Diagnosis not present

## 2022-12-31 DIAGNOSIS — Z950 Presence of cardiac pacemaker: Secondary | ICD-10-CM | POA: Diagnosis not present

## 2022-12-31 DIAGNOSIS — I700 Atherosclerosis of aorta: Secondary | ICD-10-CM | POA: Diagnosis not present

## 2022-12-31 DIAGNOSIS — M059 Rheumatoid arthritis with rheumatoid factor, unspecified: Secondary | ICD-10-CM | POA: Diagnosis not present

## 2022-12-31 DIAGNOSIS — Z0000 Encounter for general adult medical examination without abnormal findings: Secondary | ICD-10-CM | POA: Diagnosis not present

## 2022-12-31 DIAGNOSIS — Z1331 Encounter for screening for depression: Secondary | ICD-10-CM | POA: Diagnosis not present

## 2022-12-31 DIAGNOSIS — E782 Mixed hyperlipidemia: Secondary | ICD-10-CM | POA: Diagnosis not present

## 2022-12-31 DIAGNOSIS — K219 Gastro-esophageal reflux disease without esophagitis: Secondary | ICD-10-CM | POA: Diagnosis not present

## 2022-12-31 DIAGNOSIS — I10 Essential (primary) hypertension: Secondary | ICD-10-CM | POA: Diagnosis not present

## 2023-01-10 ENCOUNTER — Ambulatory Visit (HOSPITAL_COMMUNITY)
Admission: RE | Admit: 2023-01-10 | Discharge: 2023-01-10 | Disposition: A | Discharge status: Home / Self Care | Payer: Medicare PPO | Source: Ambulatory Visit | Attending: Emergency Medicine | Admitting: Emergency Medicine

## 2023-01-10 ENCOUNTER — Encounter (HOSPITAL_COMMUNITY): Payer: Self-pay

## 2023-01-10 ENCOUNTER — Ambulatory Visit (INDEPENDENT_AMBULATORY_CARE_PROVIDER_SITE_OTHER): Payer: Medicare PPO

## 2023-01-10 VITALS — BP 141/75 | HR 67 | Temp 98.1°F | Resp 20

## 2023-01-10 DIAGNOSIS — B349 Viral infection, unspecified: Secondary | ICD-10-CM

## 2023-01-10 DIAGNOSIS — R051 Acute cough: Secondary | ICD-10-CM

## 2023-01-10 DIAGNOSIS — R059 Cough, unspecified: Secondary | ICD-10-CM | POA: Diagnosis not present

## 2023-01-10 MED ORDER — BENZONATATE 100 MG PO CAPS: 100.0000 mg | ORAL_CAPSULE | Freq: Three times a day (TID) | ORAL | 0 refills | Status: DC | PRN

## 2023-01-10 MED ORDER — GUAIFENESIN ER 600 MG PO TB12: 600.0000 mg | ORAL_TABLET | Freq: Two times a day (BID) | ORAL | 0 refills | Status: AC

## 2023-01-10 NOTE — ED Provider Notes (Signed)
[facility]    CSN: [id number] Arrival date & time: 01/10/23  1449     History   Chief Complaint Chief Complaint  Patient presents with   Cough    HPI Deborah L [place]ville is a 77 y.o. female.  Here with about 1 week history of nasal congestion and cough.  Reports cough is somewhat productive. Feels there is some mucous sitting in the chest. Nasal congestion has started to improve. No shortness of breath or trouble breathing. No chest pain. Not having fever or chills Denies sick contacts No prior lung history.  Has tried Coricidin and Mucinex.  Past Medical History:  Diagnosis Date   Allergy    Arthritis    Bradycardia    pacemaker placed in April 2018  [employer]tr[employer]   GERD (gastroesophageal reflux disease)    Hyperlipidemia    Hypertension    Seasonal allergies     Patient Active Problem List   Diagnosis Date Noted   Pacemaker 02/10/2019   Heart block, AV 10/29/2016   Mobitz type 2 second degree atrioventricular block 10/29/2016   Arthritis of shoulder 12/31/2012   HTN (hypertension) 10/22/2012   Pure hypercholesterolemia 10/22/2012    Past Surgical History:  Procedure Laterality Date   ABDOMINAL HYSTERECTOMY  1995   COLONOSCOPY  2013   brodie   FOOT SURGERY  1985   bilateral for flat feet with pain   PACEMAKER IMPLANT N/A 10/29/2016   Procedure: Pacemaker Implant;  Surgeon: Marinus Maw, MD;  Location: [facility];  Service: Cardiovascular;  Laterality: N/A;   POLYPECTOMY     TONSILLECTOMY  as child   TOTAL SHOULDER ARTHROPLASTY Right 12/30/2012   Procedure: RIGHT TOTAL SHOULDER ARTHROPLASTY;  Surgeon: Mable Paris, MD;  Location: [facility];  Service: Orthopedics;  Laterality: Right;  interscaline block   WISDOM TOOTH EXTRACTION      OB History   No obstetric history on file.      Home Medications    Prior to Admission medications   Medication Sig Start Date End Date Taking? Authorizing Provider  aspirin 81 MG tablet Take 81 mg  by mouth daily.   Yes [provider]  benzonatate (TESSALON) 100 MG capsule Take 1 capsule (100 mg total) by mouth 3 (three) times daily as needed for cough. 01/10/23  Yes Bhumi Godbey, Lurena Joiner, PA-C  Calcium Carbonate-Vitamin D (CALCIUM 600 + D PO) Take 1 tablet by mouth daily.   Yes [provider]  carvedilol (COREG) 6.25 MG tablet Take 1 tablet (6.25 mg total) by mouth 2 (two) times daily with a meal. 07/05/19  Yes Marinus Maw, MD  cetirizine (ZYRTEC) 10 MG tablet Take 10 mg by mouth daily.   Yes [provider]  cholecalciferol (VITAMIN D-400) 400 UNITS TABS Take 400 Units by mouth daily.   Yes [provider]  Cod Liver Oil 1000 MG CAPS Take 1 capsule by mouth daily.   Yes [provider]  Enema Mineral Oil ENEM Place 1 application rectally daily. 01/22/18  Yes Wurst, [place], PA-C  fenofibrate 160 MG tablet Take 1 tablet by mouth daily. 02/06/19  Yes [provider]  Garlic 10 MG CAPS Take 1 tablet by mouth daily.   Yes [provider]  guaiFENesin (MUCINEX) 600 MG 12 hr tablet Take 1 tablet (600 mg total) by mouth 2 (two) times daily for 5 days. 01/10/23 01/15/23 Yes Linzy Laury, Lurena Joiner, PA-C  ibuprofen (ADVIL,MOTRIN) 200 MG tablet Take 400 mg by mouth every 6 (  six) hours as needed for mild pain.   Yes [provider]  irbesartan (AVAPRO) 150 MG tablet Take 1 tablet (150 mg total) by mouth daily. 02/10/19  Yes Marinus Maw, MD  methotrexate 2.5 MG tablet Take 15 mg by mouth once a week. 08/14/21  Yes [provider]  mineral oil-hydrophilic petrolatum (AQUAPHOR) ointment Apply topically as needed for dry skin. 05/29/22  Yes Carlisle Beers, FNP  naproxen sodium (ALEVE) 220 MG tablet Take 220 mg by mouth.   Yes [provider]  pantoprazole (PROTONIX) 20 MG tablet Take 1 tablet (20 mg total) by mouth daily. 08/26/20  Yes Rhys Martini, PA-C  polyethylene glycol (MIRALAX / GLYCOLAX) packet Take 17 g by mouth  daily. 01/22/18  Yes Wurst, [place], PA-C  vitamin C (ASCORBIC ACID) 500 MG tablet Take 500 mg by mouth daily.   Yes [provider]  omeprazole (PRILOSEC) 20 MG capsule Take 20 mg by mouth daily.  08/26/20  [provider]    Family History Family History  Problem Relation Age of Onset   Dementia Mother    Colon cancer Maternal Aunt 28   Colon cancer Cousin 50       maternal   Rectal cancer Neg Hx    Stomach cancer Neg Hx    Colon polyps Neg Hx    Esophageal cancer Neg Hx     Social History Social History   Tobacco Use   Smoking status: Every Day    Packs/day: .25    Types: Cigarettes   Smokeless tobacco: Never  Vaping Use   Vaping Use: Never used  Substance Use Topics   Alcohol use: No   Drug use: No     Allergies   Macrobid [nitrofurantoin macrocrystal]   Review of Systems Review of Systems  Respiratory:  Positive for cough.    As per HPI  Physical Exam Triage Vital Signs ED Triage Vitals [01/10/23 1506]  Enc Vitals Group     BP (!) 141/75     Pulse Rate 96     Resp (!) 21     Temp 98.1 F (36.7 C)     Temp Source Oral     SpO2 94 %     Weight      Height      Head Circumference      Peak Flow      Pain Score 0     Pain Loc      Pain Edu?      Excl. in GC?    No data found.  Updated Vital Signs BP (!) 141/75 (BP Location: Right Arm)   Pulse 67   Temp 98.1 F (36.7 C) (Oral)   Resp 20   SpO2 97%      Physical Exam Vitals and nursing note reviewed.  Constitutional:      General: She is not in acute distress.    Appearance: She is not diaphoretic.  HENT:     Nose: No congestion or rhinorrhea.     Mouth/Throat:     Mouth: Mucous membranes are moist.     Pharynx: Oropharynx is clear. No posterior oropharyngeal erythema.  Eyes:     Conjunctiva/sclera: Conjunctivae normal.  Cardiovascular:     Rate and Rhythm: Normal rate and regular rhythm.     Pulses: Normal pulses.     Heart sounds: Normal heart sounds.   Pulmonary:     Effort: Pulmonary effort is normal.     Comments:  Wet sounding cough frequently in clinic. Faint crackles in left lobe Musculoskeletal:     Cervical back: Normal range of motion.  Lymphadenopathy:     Cervical: No cervical adenopathy.  Skin:    General: Skin is warm and dry.  Neurological:     Mental Status: She is alert and oriented to person, place, and time.     UC Treatments / Results  Labs (all labs ordered are listed, but only abnormal results are displayed) Labs Reviewed - No data to display  EKG  Radiology DG Chest 2 View  Result Date: 01/10/2023 CLINICAL DATA:  cough over a week EXAM: CHEST - 2 VIEW COMPARISON:  August 09, 2022 FINDINGS: The cardiomediastinal silhouette is unchanged in contour.Atherosclerotic calcifications of the aorta. LEFT chest cardiac pacing device. No pleural effusion. No pneumothorax. No acute pleuroparenchymal abnormality. Visualized abdomen is unremarkable. Multilevel degenerative changes of the thoracic spine. Status post RIGHT shoulder arthroplasty. IMPRESSION: No acute cardiopulmonary abnormality. Electronically Signed   By: Meda Klinefelter M.D.   On: 01/10/2023 16:08    Procedures Procedures   Medications Ordered in UC Medications - No data to display  Initial Impression / Assessment and Plan / UC Course  I have reviewed the triage vital signs and the nursing notes.  Pertinent labs & imaging results that were available during my care of the patient were reviewed by me and considered in my medical decision making (see chart for details).  Chest xray is negative. Images independently reviewed by me, agree with radiology interpretation. Patient has done well with tessalon in the past. Will use TID prn. Continue mucinex BID, increase fluids, other symptomatic care. Dicussed return and ED precautions. Patient agreeable to plan. Stable vitals at discharge.   Final Clinical Impressions(s) / UC Diagnoses   Final diagnoses:   Acute cough  Viral illness     Discharge Instructions      The xray is negative.   You can take the cough pills 3x daily. If the medicine makes you drowsy then take only one before bed.  I recommend to continue mucinex to help thin mucous and pull it out of the chest. This works best if you are very hydrated, so continue taking in lots of fluids  You can also try a humidifier or breathing in steam from hot shower. Prop up at nighttime to reduce cough  Monitor symptoms and please return if no improvement after 5 or so days. Go to the emergency department if symptoms worsen.      ED Prescriptions     Medication Sig Dispense Auth. Provider   guaiFENesin (MUCINEX) 600 MG 12 hr tablet Take 1 tablet (600 mg total) by mouth 2 (two) times daily for 5 days. 10 tablet Deandria Klute, PA-C   benzonatate (TESSALON) 100 MG capsule Take 1 capsule (100 mg total) by mouth 3 (three) times daily as needed for cough. 30 capsule Lacye Mccarn, Lurena Joiner, PA-C      PDMP not reviewed this encounter.   Kathrine Haddock 01/10/23 1737

## 2023-01-10 NOTE — Discharge Instructions (Addendum)
The xray is negative.   You can take the cough pills 3x daily. If the medicine makes you drowsy then take only one before bed.  I recommend to continue mucinex to help thin mucous and pull it out of the chest. This works best if you are very hydrated, so continue taking in lots of fluids  You can also try a humidifier or breathing in steam from hot shower. Prop up at nighttime to reduce cough  Monitor symptoms and please return if no improvement after 5 or so days. Go to the emergency department if symptoms worsen.

## 2023-01-10 NOTE — ED Triage Notes (Signed)
Sx cough and chest congestion. X 1 week. Taking cough syrup and mucinex

## 2023-01-16 ENCOUNTER — Ambulatory Visit (HOSPITAL_COMMUNITY)
Admission: RE | Admit: 2023-01-16 | Discharge: 2023-01-16 | Disposition: A | Discharge status: Home / Self Care | Payer: Medicare PPO | Source: Ambulatory Visit | Attending: Emergency Medicine | Admitting: Emergency Medicine

## 2023-01-16 ENCOUNTER — Encounter (HOSPITAL_COMMUNITY): Payer: Self-pay

## 2023-01-16 VITALS — BP 120/67 | HR 70 | Temp 98.1°F | Resp 18

## 2023-01-16 DIAGNOSIS — J209 Acute bronchitis, unspecified: Secondary | ICD-10-CM | POA: Insufficient documentation

## 2023-01-16 DIAGNOSIS — F17210 Nicotine dependence, cigarettes, uncomplicated: Secondary | ICD-10-CM | POA: Insufficient documentation

## 2023-01-16 DIAGNOSIS — Z1152 Encounter for screening for COVID-19: Secondary | ICD-10-CM | POA: Diagnosis not present

## 2023-01-16 DIAGNOSIS — R059 Cough, unspecified: Secondary | ICD-10-CM | POA: Insufficient documentation

## 2023-01-16 LAB — SARS CORONAVIRUS 2 (TAT 6-24 HRS): SARS Coronavirus 2: NEGATIVE

## 2023-01-16 MED ORDER — BENZONATATE 100 MG PO CAPS: 100.0000 mg | ORAL_CAPSULE | Freq: Three times a day (TID) | ORAL | 0 refills | Status: DC | PRN

## 2023-01-16 NOTE — ED Provider Notes (Signed)
[facility]    CSN: [id number] Arrival date & time: 01/16/23  0807      History   Chief Complaint Chief Complaint  Patient presents with   Cough    HPI Denise Hodges is a 77 y.o. female.   Patient presents to clinic with an ongoing cough that has been present for the past 2 weeks.  She was seen at this clinic on 6/15, had a negative CXR and sent in some Mucinex and Tessalon Perles.  Since this visit, patient reports her cough is improved.  She has been around multiple sick people at church and would like testing for COVID-19.  Reports her cough is productive with a clear phlegm.  She has some clear nasal drainage.  Denies sore throats, body aches, shortness of breath, wheezing, chest pain, fevers or chills.  Did have some fatigue initially, this is resolved over a week ago.  She has been taking the Mucinex, has not been taking an allergy medication.  Taking [employer] as needed.  She is a daily cigarette smoker.    The history is provided by the patient and medical records.  Cough Associated symptoms: rhinorrhea   Associated symptoms: no chest pain, no chills, no fever, no shortness of breath, no sore throat and no wheezing     Past Medical History:  Diagnosis Date   Allergy    Arthritis    Bradycardia    pacemaker placed in April 2018  Medtronic   GERD (gastroesophageal reflux disease)    Hyperlipidemia    Hypertension    Seasonal allergies     Patient Active Problem List   Diagnosis Date Noted   Pacemaker 02/10/2019   Heart block, AV 10/29/2016   Mobitz type 2 second degree atrioventricular block 10/29/2016   Arthritis of shoulder 12/31/2012   HTN (hypertension) 10/22/2012   Pure hypercholesterolemia 10/22/2012    Past Surgical History:  Procedure Laterality Date   ABDOMINAL HYSTERECTOMY  1995   COLONOSCOPY  2013   brodie   FOOT SURGERY  1985   bilateral for flat feet with pain   PACEMAKER IMPLANT N/A 10/29/2016   Procedure: Pacemaker  Implant;  Surgeon: Marinus Maw, MD;  Location: [facility];  Service: Cardiovascular;  Laterality: N/A;   POLYPECTOMY     TONSILLECTOMY  as child   TOTAL SHOULDER ARTHROPLASTY Right 12/30/2012   Procedure: RIGHT TOTAL SHOULDER ARTHROPLASTY;  Surgeon: Mable Paris, MD;  Location: [facility];  Service: Orthopedics;  Laterality: Right;  interscaline block   WISDOM TOOTH EXTRACTION      OB History   No obstetric history on file.      Home Medications    Prior to Admission medications   Medication Sig Start Date End Date Taking? Authorizing Provider  aspirin 81 MG tablet Take 81 mg by mouth daily.   Yes [provider]  Calcium Carbonate-Vitamin D (CALCIUM 600 + D PO) Take 1 tablet by mouth daily.   Yes [provider]  carvedilol (COREG) 6.25 MG tablet Take 1 tablet (6.25 mg total) by mouth 2 (two) times daily with a meal. 07/05/19  Yes Marinus Maw, MD  cholecalciferol (VITAMIN D-400) 400 UNITS TABS Take 400 Units by mouth daily.   Yes [provider]  Cod Liver Oil 1000 MG CAPS Take 1 capsule by mouth daily.   Yes [provider]  Enema Mineral Oil ENEM Place 1 application rectally daily. 01/22/18  Yes Wurst, [place], PA-C  fenofibrate  160 MG tablet Take 1 tablet by mouth daily. 02/06/19  Yes [provider]  Garlic 10 MG CAPS Take 1 tablet by mouth daily.   Yes [provider]  ibuprofen (ADVIL,MOTRIN) 200 MG tablet Take 400 mg by mouth every 6 (six) hours as needed for mild pain.   Yes [provider]  irbesartan (AVAPRO) 150 MG tablet Take 1 tablet (150 mg total) by mouth daily. 02/10/19  Yes Marinus Maw, MD  methotrexate 2.5 MG tablet Take 15 mg by mouth once a week. 08/14/21  Yes [provider]  mineral oil-hydrophilic petrolatum (AQUAPHOR) ointment Apply topically as needed for dry skin. 05/29/22  Yes Carlisle Beers, FNP  naproxen sodium (ALEVE) 220 MG tablet Take 220 mg by mouth.   Yes  [provider]  pantoprazole (PROTONIX) 20 MG tablet Take 1 tablet (20 mg total) by mouth daily. 08/26/20  Yes Rhys Martini, PA-C  polyethylene glycol (MIRALAX / GLYCOLAX) packet Take 17 g by mouth daily. 01/22/18  Yes Wurst, [place], PA-C  vitamin C (ASCORBIC ACID) 500 MG tablet Take 500 mg by mouth daily.   Yes [provider]  benzonatate (TESSALON) 100 MG capsule Take 1 capsule (100 mg total) by mouth 3 (three) times daily as needed for cough. 01/16/23   Manjot Hinks, [place], FNP  cetirizine (ZYRTEC) 10 MG tablet Take 10 mg by mouth daily.    [provider]  omeprazole (PRILOSEC) 20 MG capsule Take 20 mg by mouth daily.  08/26/20  [provider]    Family History Family History  Problem Relation Age of Onset   Dementia Mother    Colon cancer Maternal Aunt 71   Colon cancer Cousin 50       maternal   Rectal cancer Neg Hx    Stomach cancer Neg Hx    Colon polyps Neg Hx    Esophageal cancer Neg Hx     Social History Social History   Tobacco Use   Smoking status: Every Day    Packs/day: .25    Types: Cigarettes   Smokeless tobacco: Never  Vaping Use   Vaping Use: Never used  Substance Use Topics   Alcohol use: No   Drug use: No     Allergies   Macrobid [nitrofurantoin macrocrystal]   Review of Systems Review of Systems  Constitutional:  Negative for chills, fatigue and fever.  HENT:  Positive for congestion, postnasal drip and rhinorrhea. Negative for sore throat.   Respiratory:  Positive for cough. Negative for shortness of breath and wheezing.   Cardiovascular:  Negative for chest pain.  Gastrointestinal:  Negative for abdominal pain.     Physical Exam Triage Vital Signs ED Triage Vitals  Enc Vitals Group     BP 01/16/23 0837 120/67     Pulse Rate 01/16/23 0837 70     Resp 01/16/23 0837 18     Temp 01/16/23 0837 98.1 F (36.7 C)     Temp Source 01/16/23 0837 Oral     SpO2 01/16/23 0837 98 %     Weight --       Height --      Head Circumference --      Peak Flow --      Pain Score 01/16/23 0836 0     Pain Loc --      Pain Edu? --      Excl. in GC? --    No data found.  Updated Vital Signs BP  120/67 (BP Location: Left Arm)   Pulse 70   Temp 98.1 F (36.7 C) (Oral)   Resp 18   SpO2 98%   Visual Acuity Right Eye Distance:   Left Eye Distance:   Bilateral Distance:    Right Eye Near:   Left Eye Near:    Bilateral Near:     Physical Exam Vitals and nursing note reviewed.  Constitutional:      Appearance: Normal appearance.  HENT:     Head: Normocephalic and atraumatic.     Right Ear: External ear normal.     Left Ear: External ear normal.     Nose: Congestion present.     Mouth/Throat:     Mouth: Mucous membranes are moist.  Eyes:     Conjunctiva/sclera: Conjunctivae normal.  Cardiovascular:     Rate and Rhythm: Normal rate and regular rhythm.     Heart sounds: Normal heart sounds. No murmur heard. Pulmonary:     Effort: Pulmonary effort is normal. No respiratory distress.     Breath sounds: Normal breath sounds.  Musculoskeletal:        General: No swelling. Normal range of motion.  Skin:    General: Skin is warm and dry.  Neurological:     General: No focal deficit present.     Mental Status: She is alert.  Psychiatric:        Mood and Affect: Mood normal.        Behavior: Behavior is cooperative.      UC Treatments / Results  Labs (all labs ordered are listed, but only abnormal results are displayed) Labs Reviewed  SARS CORONAVIRUS 2 (TAT 6-24 HRS)    EKG   Radiology No results found.  Procedures Procedures (including critical care time)  Medications Ordered in UC Medications - No data to display  Initial Impression / Assessment and Plan / UC Course  I have reviewed the triage vital signs and the nursing notes.  Pertinent labs & imaging results that were available during my care of the patient were reviewed by me and considered in my medical  decision making (see chart for details).  Vitals and triage reviewed, patient is hemodynamically stable.  Lungs vesicular posteriorly, oxygenation 98% on room air.  Patient reports overall improvement in cough, has been taking Mucinex and Tessalon Perles as needed.  Presents to clinic today requesting COVID-19 testing.  Low concern for bacterial etiology without tachycardia, fever, fatigue or worsening cough.  Negative chest x-ray at previous visit.  Will refill Tessalon Perles, encouraged to continue on Mucinex.  COVID-19 testing obtained, discussed that this is likely a postviral cough/bronchitis.  Patient verbalized understanding, plan of care, follow-up care and return precautions given, no questions at this time.     Final Clinical Impressions(s) / UC Diagnoses   Final diagnoses:  Acute bronchitis, unspecified organism     Discharge Instructions      Overall your physical exam was reassuring.  Your vitals were stable and your lung sounds were clear.  For your continued cough, please use the Tessalon Perles up to 3 times daily as needed.  Continue on the Mucinex.  Ensure you are drinking at least 64 ounces of water daily to help mobilize your secretions.  We have tested you for COVID-19 and we will contact you if it results is positive, this should result by tomorrow.  Please return to clinic if you develop fatigue, fever, changes in your productive cough, or any new concerning symptoms.  ED Prescriptions     Medication Sig Dispense Auth. Provider   benzonatate (TESSALON) 100 MG capsule Take 1 capsule (100 mg total) by mouth 3 (three) times daily as needed for cough. 30 capsule Giavonni Cizek, [place], [place]      PDMP not reviewed this encounter.   Rinaldo Ratel [place] N, [place] 01/16/23 [phone]

## 2023-01-16 NOTE — Discharge Instructions (Signed)
Overall your physical exam was reassuring.  Your vitals were stable and your lung sounds were clear.  For your continued cough, please use the Tessalon Perles up to 3 times daily as needed.  Continue on the Mucinex.  Ensure you are drinking at least 64 ounces of water daily to help mobilize your secretions.  We have tested you for COVID-19 and we will contact you if it results is positive, this should result by tomorrow.  Please return to clinic if you develop fatigue, fever, changes in your productive cough, or any new concerning symptoms.

## 2023-01-16 NOTE — ED Triage Notes (Signed)
Pt states she is no better since last visit on 01/10/2023. She states that she has been taking the meds given at visit. She wants to make sure she doesn't have covid. Sx x 2 weeks. She states she has been taking mucinex but not her allergy meds.

## 2023-01-20 ENCOUNTER — Ambulatory Visit (INDEPENDENT_AMBULATORY_CARE_PROVIDER_SITE_OTHER): Payer: Medicare PPO

## 2023-01-20 DIAGNOSIS — I4430 Unspecified atrioventricular block: Secondary | ICD-10-CM

## 2023-01-21 DIAGNOSIS — D72810 Lymphocytopenia: Secondary | ICD-10-CM | POA: Diagnosis not present

## 2023-01-21 LAB — CUP PACEART REMOTE DEVICE CHECK
Battery Remaining Longevity: 28 mo
Battery Remaining Percentage: 28 %
Battery Voltage: 2.93 V
Brady Statistic AP VP Percent: 5 %
Brady Statistic AP VS Percent: 1.3 %
Brady Statistic AS VP Percent: 74 %
Brady Statistic AS VS Percent: 19 %
Brady Statistic RA Percent Paced: 5.5 %
Brady Statistic RV Percent Paced: 79 %
Date Time Interrogation Session: 20240625020014
Implantable Lead Connection Status: 753985
Implantable Lead Connection Status: 753985
Implantable Lead Implant Date: 20180404
Implantable Lead Implant Date: 20180404
Implantable Lead Location: 753859
Implantable Lead Location: 753860
Implantable Lead Model: 3830
Implantable Pulse Generator Implant Date: 20180404
Lead Channel Impedance Value: 510 Ohm
Lead Channel Impedance Value: 590 Ohm
Lead Channel Pacing Threshold Amplitude: 0.5 V
Lead Channel Pacing Threshold Amplitude: 0.5 V
Lead Channel Pacing Threshold Pulse Width: 0.4 ms
Lead Channel Pacing Threshold Pulse Width: 1 ms
Lead Channel Sensing Intrinsic Amplitude: 12 mV
Lead Channel Sensing Intrinsic Amplitude: 4 mV
Lead Channel Setting Pacing Amplitude: 2 V
Lead Channel Setting Pacing Amplitude: 2.5 V
Lead Channel Setting Pacing Pulse Width: 1 ms
Lead Channel Setting Sensing Sensitivity: 2 mV
Pulse Gen Model: 2272
Pulse Gen Serial Number: 8002284

## 2023-01-23 DIAGNOSIS — Z8262 Family history of osteoporosis: Secondary | ICD-10-CM | POA: Diagnosis not present

## 2023-01-23 DIAGNOSIS — R069 Unspecified abnormalities of breathing: Secondary | ICD-10-CM | POA: Diagnosis not present

## 2023-01-23 DIAGNOSIS — E349 Endocrine disorder, unspecified: Secondary | ICD-10-CM | POA: Diagnosis not present

## 2023-01-23 DIAGNOSIS — N958 Other specified menopausal and perimenopausal disorders: Secondary | ICD-10-CM | POA: Diagnosis not present

## 2023-01-23 DIAGNOSIS — M8588 Other specified disorders of bone density and structure, other site: Secondary | ICD-10-CM | POA: Diagnosis not present

## 2023-01-23 DIAGNOSIS — E2839 Other primary ovarian failure: Secondary | ICD-10-CM | POA: Diagnosis not present

## 2023-02-05 DIAGNOSIS — D649 Anemia, unspecified: Secondary | ICD-10-CM | POA: Diagnosis not present

## 2023-02-13 NOTE — Progress Notes (Signed)
Remote pacemaker transmission.   

## 2023-02-14 ENCOUNTER — Other Ambulatory Visit: Payer: Self-pay

## 2023-02-14 ENCOUNTER — Emergency Department (HOSPITAL_COMMUNITY)
Admission: EM | Admit: 2023-02-14 | Discharge: 2023-02-14 | Disposition: A | Discharge status: Home / Self Care | Payer: Medicare PPO | Source: Home / Self Care | Attending: Emergency Medicine | Admitting: Emergency Medicine

## 2023-02-14 ENCOUNTER — Encounter (HOSPITAL_COMMUNITY): Payer: Self-pay | Admitting: Emergency Medicine

## 2023-02-14 ENCOUNTER — Emergency Department (HOSPITAL_COMMUNITY): Payer: Medicare PPO

## 2023-02-14 ENCOUNTER — Encounter (HOSPITAL_COMMUNITY): Payer: Self-pay

## 2023-02-14 ENCOUNTER — Ambulatory Visit (HOSPITAL_COMMUNITY)
Admission: EM | Admit: 2023-02-14 | Discharge: 2023-02-14 | Disposition: A | Discharge status: Home / Self Care | Payer: Medicare PPO

## 2023-02-14 DIAGNOSIS — M792 Neuralgia and neuritis, unspecified: Secondary | ICD-10-CM

## 2023-02-14 DIAGNOSIS — M19012 Primary osteoarthritis, left shoulder: Secondary | ICD-10-CM | POA: Diagnosis not present

## 2023-02-14 DIAGNOSIS — M25522 Pain in left elbow: Secondary | ICD-10-CM | POA: Diagnosis not present

## 2023-02-14 DIAGNOSIS — M25512 Pain in left shoulder: Secondary | ICD-10-CM

## 2023-02-14 DIAGNOSIS — Z7982 Long term (current) use of aspirin: Secondary | ICD-10-CM | POA: Diagnosis not present

## 2023-02-14 DIAGNOSIS — M25422 Effusion, left elbow: Secondary | ICD-10-CM

## 2023-02-14 DIAGNOSIS — M79602 Pain in left arm: Secondary | ICD-10-CM | POA: Diagnosis not present

## 2023-02-14 DIAGNOSIS — R509 Fever, unspecified: Secondary | ICD-10-CM

## 2023-02-14 MED ORDER — OXYCODONE-ACETAMINOPHEN 5-325 MG PO TABS: 1.0000 | ORAL_TABLET | Freq: Three times a day (TID) | ORAL | 0 refills | Status: DC | PRN

## 2023-02-14 MED ORDER — IBUPROFEN 600 MG PO TABS: 600.0000 mg | ORAL_TABLET | Freq: Three times a day (TID) | ORAL | 0 refills | Status: AC | PRN

## 2023-02-14 MED ORDER — PREDNISONE 10 MG PO TABS: 40.0000 mg | ORAL_TABLET | Freq: Every day | ORAL | 0 refills | Status: AC

## 2023-02-14 MED ORDER — PREDNISONE 20 MG PO TABS
60.0000 mg | ORAL_TABLET | Freq: Once | ORAL | Status: AC
  Administered 2023-02-14: 60 mg via ORAL
  Filled 2023-02-14: qty 3

## 2023-02-14 MED ORDER — MORPHINE SULFATE (PF) 4 MG/ML IV SOLN
4.0000 mg | Freq: Once | INTRAVENOUS | Status: AC
  Administered 2023-02-14: 4 mg via INTRAMUSCULAR
  Filled 2023-02-14: qty 1

## 2023-02-14 MED ORDER — KETOROLAC TROMETHAMINE 30 MG/ML IJ SOLN
30.0000 mg | Freq: Once | INTRAMUSCULAR | Status: AC
  Administered 2023-02-14: 30 mg via INTRAMUSCULAR
  Filled 2023-02-14: qty 1

## 2023-02-14 NOTE — ED Provider Notes (Signed)
[facility]    CSN: [id number] Arrival date & time: 02/14/23  1649      History   Chief Complaint Chief Complaint  Patient presents with   Shoulder Pain    HPI Denise Hodges is a 77 y.o. female.   Patient presents to urgent care for evaluation of sudden onset severe left arm pain that starts at the shoulder and travels distally to the left elbow and left wrist/left hand that started last night with not known trauma or injury.  Patient states that the pain kept her awake all night and is worsened by movement/light touch.  Reports chills without known fever at home today.  States the left arm is swollen starting at the left elbow joint and swelling travels to the left hand.  Denies history of immunosuppression and recent antibiotic/steroid use.  She took Tylenol arthritis strength at 5 AM this morning and states this did not help very much with pain.  Currently with low-grade fever at 100.3.    Shoulder Pain   Past Medical History:  Diagnosis Date   Allergy    Arthritis    Bradycardia    pacemaker placed in April 2018  Medtronic   GERD (gastroesophageal reflux disease)    Hyperlipidemia    Hypertension    Seasonal allergies     Patient Active Problem List   Diagnosis Date Noted   Pacemaker 02/10/2019   Heart block, AV 10/29/2016   Mobitz type 2 second degree atrioventricular block 10/29/2016   Arthritis of shoulder 12/31/2012   HTN (hypertension) 10/22/2012   Pure hypercholesterolemia 10/22/2012    Past Surgical History:  Procedure Laterality Date   ABDOMINAL HYSTERECTOMY  1995   COLONOSCOPY  2013   brodie   FOOT SURGERY  1985   bilateral for flat feet with pain   PACEMAKER IMPLANT N/A 10/29/2016   Procedure: Pacemaker Implant;  Surgeon: Marinus Maw, MD;  Location: [facility];  Service: Cardiovascular;  Laterality: N/A;   POLYPECTOMY     TONSILLECTOMY  as child   TOTAL SHOULDER ARTHROPLASTY Right 12/30/2012   Procedure: RIGHT TOTAL SHOULDER  ARTHROPLASTY;  Surgeon: Mable Paris, MD;  Location: [facility];  Service: Orthopedics;  Laterality: Right;  interscaline block   WISDOM TOOTH EXTRACTION      OB History   No obstetric history on file.      Home Medications    Prior to Admission medications   Medication Sig Start Date End Date Taking? Authorizing Provider  aspirin 81 MG tablet Take 81 mg by mouth daily.   Yes [provider]  benzonatate (TESSALON) 100 MG capsule Take 1 capsule (100 mg total) by mouth 3 (three) times daily as needed for cough. 01/16/23  Yes Rinaldo Ratel, [place], FNP  Calcium Carbonate-Vitamin D (CALCIUM 600 + D PO) Take 1 tablet by mouth daily.   Yes [provider]  carvedilol (COREG) 6.25 MG tablet Take 1 tablet (6.25 mg total) by mouth 2 (two) times daily with a meal. 07/05/19  Yes Marinus Maw, MD  cetirizine (ZYRTEC) 10 MG tablet Take 10 mg by mouth daily.   Yes [provider]  cholecalciferol (VITAMIN D-400) 400 UNITS TABS Take 400 Units by mouth daily.   Yes [provider]  fenofibrate 160 MG tablet Take 1 tablet by mouth daily. 02/06/19  Yes [provider]  irbesartan (AVAPRO) 150 MG tablet Take 1 tablet (150 mg total) by mouth daily. 02/10/19  Yes Marinus Maw,  MD  methotrexate 2.5 MG tablet Take 15 mg by mouth once a week. 08/14/21  Yes [provider]  pantoprazole (PROTONIX) 20 MG tablet Take 1 tablet (20 mg total) by mouth daily. 08/26/20  Yes Rhys Martini, PA-C  Cod Liver Oil 1000 MG CAPS Take 1 capsule by mouth daily.    [provider]  Enema Mineral Oil ENEM Place 1 application rectally daily. 01/22/18   Wurst, [place], PA-C  Garlic 10 MG CAPS Take 1 tablet by mouth daily.    [provider]  ibuprofen (ADVIL,MOTRIN) 200 MG tablet Take 400 mg by mouth every 6 (six) hours as needed for mild pain.    [provider]  mineral oil-hydrophilic petrolatum (AQUAPHOR) ointment Apply topically as needed  for dry skin. 05/29/22   Carlisle Beers, FNP  naproxen sodium (ALEVE) 220 MG tablet Take 220 mg by mouth.    [provider]  polyethylene glycol (MIRALAX / GLYCOLAX) packet Take 17 g by mouth daily. 01/22/18   Wurst, [place], PA-C  vitamin C (ASCORBIC ACID) 500 MG tablet Take 500 mg by mouth daily.    [provider]  omeprazole (PRILOSEC) 20 MG capsule Take 20 mg by mouth daily.  08/26/20  [provider]    Family History Family History  Problem Relation Age of Onset   Dementia Mother    Colon cancer Maternal Aunt 78   Colon cancer Cousin 50       maternal   Rectal cancer Neg Hx    Stomach cancer Neg Hx    Colon polyps Neg Hx    Esophageal cancer Neg Hx     Social History Social History   Tobacco Use   Smoking status: Every Day    Current packs/day: 0.25    Types: Cigarettes   Smokeless tobacco: Never  Vaping Use   Vaping status: Never Used  Substance Use Topics   Alcohol use: No   Drug use: No     Allergies   Macrobid [nitrofurantoin macrocrystal]   Review of Systems Review of Systems Per HPI  Physical Exam Triage Vital Signs ED Triage Vitals  Encounter Vitals Group     BP 02/14/23 1722 (!) 167/75     Systolic BP Percentile --      Diastolic BP Percentile --      Pulse Rate 02/14/23 1722 74     Resp 02/14/23 1722 16     Temp 02/14/23 1722 100.3 F (37.9 C)     Temp Source 02/14/23 1722 Oral     SpO2 02/14/23 1722 98 %     Weight 02/14/23 1722 128 lb (58.1 kg)     Height 02/14/23 1722 5' 4.5" (1.638 m)     Head Circumference --      Peak Flow --      Pain Score 02/14/23 1720 10     Pain Loc --      Pain Education --      Exclude from Growth Chart --    No data found.  Updated Vital Signs BP (!) 167/75 (BP Location: Right Arm)   Pulse 74   Temp 100.3 F (37.9 C) (Oral)   Resp 16   Ht 5' 4.5" (1.638 m)   Wt 128 lb (58.1 kg)   SpO2 98%   BMI 21.63 kg/m   Visual Acuity Right Eye Distance:   Left Eye  Distance:   Bilateral Distance:    Right Eye Near:   Left Eye Near:  Bilateral Near:     Physical Exam Vitals and nursing note reviewed.  Constitutional:      Appearance: She is ill-appearing. She is not toxic-appearing.  HENT:     Head: Normocephalic and atraumatic.     Right Ear: Hearing and external ear normal.     Left Ear: Hearing and external ear normal.     Nose: Nose normal.     Mouth/Throat:     Lips: Pink.  Eyes:     General: Lids are normal. Vision grossly intact. Gaze aligned appropriately.     Extraocular Movements: Extraocular movements intact.     Conjunctiva/sclera: Conjunctivae normal.  Pulmonary:     Effort: Pulmonary effort is normal.  Musculoskeletal:     Left shoulder: Tenderness and bony tenderness present. No swelling, deformity, effusion, laceration or crepitus. Decreased range of motion (secondary to pain). Decreased strength (secondary to pain). Normal pulse.     Left elbow: Swelling and effusion present. No deformity or lacerations. Decreased range of motion. Tenderness (Diffuse tenderness to light palpation and with movement) present.     Left wrist: Tenderness and bony tenderness present. No swelling, deformity, effusion, lacerations, snuff box tenderness or crepitus. Normal range of motion. Normal pulse.     Cervical back: Normal range of motion and neck supple.     Comments: Warmth and mild overlying soft tissue erythema present to the left elbow joint with significantly decreased ROM and severe pain elicited with light touch.   Skin:    General: Skin is warm and dry.     Capillary Refill: Capillary refill takes less than 2 seconds.     Findings: No rash.  Neurological:     General: No focal deficit present.     Mental Status: She is alert and oriented to person, place, and time. Mental status is at baseline.     Cranial Nerves: No dysarthria or facial asymmetry.  Psychiatric:        Mood and Affect: Mood normal.        Speech: Speech normal.         Behavior: Behavior normal.        Thought Content: Thought content normal.        Judgment: Judgment normal.      UC Treatments / Results  Labs (all labs ordered are listed, but only abnormal results are displayed) Labs Reviewed - No data to display  EKG   Radiology No results found.  Procedures Procedures (including critical care time)  Medications Ordered in UC Medications - No data to display  Initial Impression / Assessment and Plan / UC Course  I have reviewed the triage vital signs and the nursing notes.  Pertinent labs & imaging results that were available during my care of the patient were reviewed by me and considered in my medical decision making (see chart for details).   1. Severe pain of left shoulder, pain of left elbow, fever Unclear etiology of patient's symptoms. Question acute gouty arthritis versus septic arthritis of the left shoulder or elbow joint given fever in clinic and severe pain. Patient would benefit from urgent blood work to rule out septic arthritis etiology and likely advanced imaging in the emergency department setting that we are unfortunately unable to provide here in the urgent care setting. She is ill-appearing due to severe pain, however non-toxic in appearance. Hemodynamically stable vital signs.  Discussed risks of deferring ED visit.  Discussed recommendations and plan of care with patient and husband who express  understanding and agreement with plan. Discharged to ED via POV with husband.    Final Clinical Impressions(s) / UC Diagnoses   Final diagnoses:  Severe pain of left shoulder  Fever, unspecified fever cause   Discharge Instructions   None    ED Prescriptions   None    PDMP not reviewed this encounter.   Carlisle Beers, [place] 02/16/23 1528

## 2023-02-14 NOTE — Discharge Instructions (Signed)
Please follow-up with your orthopedic office about your effusion or swelling in your left elbow and your pain in your left arm.  This may be related to inflammation in your left elbow, for which we put you on a burst of prednisone for the next 5 days.  After he completes his prednisone course she can go back to taking her regular daily prednisone dose.  I also prescribed some ibuprofen to take "as needed" only sparingly, and always with food.  This can also help with inflammation and pain.  You should reserve Percocet for more severe pain.  If you have worsening pain in your elbow, particularly with fevers at home, loss of feeling in your fingers or weakness in your hand, please return to the ER.  This may be signs of more serious worsening nerve conditions, or a potential infection that needs treatment.

## 2023-02-14 NOTE — ED Triage Notes (Signed)
Pt reports severe left elbow pain.  No known injury.  Hx of same on the right and states she had surgery on that elbow in 2014.  Pt tearful at time of triage.

## 2023-02-14 NOTE — ED Provider Notes (Signed)
[place] [facility] DEPARTMENT AT [facility] Provider Note   CSN: [id number] Arrival date & time: 02/14/23  2130     History {Add pertinent medical, surgical, social history, OB history to HPI:1} Chief Complaint  Patient presents with   Elbow Pain    Denise Hodges is a 77 y.o. female with history of degenerative bone disease, arthritis, rheumatological disorder, 5 mg prednisone, who presented to ED with complaint of left shoulder and left elbow pain.  This began yesterday evening.  Denies trauma preceding it.  She reports she has pain radiating from her left shoulder all the way down into her distal left hand.  She denies numbness.  She said there is pain with the elbow as well.  She denies to me fevers or chills.  She is on 5 mg of prednisone daily for her rheumatological disorder.  She says that she has had issues with chronic pain in her other joints including her right shoulder, for which she had orthopedic surgery in the past.  She says she has also "arthritis in my neck".  HPI     Home Medications Prior to Admission medications   Medication Sig Start Date End Date Taking? Authorizing Provider  aspirin 81 MG tablet Take 81 mg by mouth daily.    [provider]  benzonatate (TESSALON) 100 MG capsule Take 1 capsule (100 mg total) by mouth 3 (three) times daily as needed for cough. 01/16/23   Garrison, [place] N, FNP  Calcium Carbonate-Vitamin D (CALCIUM 600 + D PO) Take 1 tablet by mouth daily.    [provider]  carvedilol (COREG) 6.25 MG tablet Take 1 tablet (6.25 mg total) by mouth 2 (two) times daily with a meal. 07/05/19   Marinus Maw, MD  cetirizine (ZYRTEC) 10 MG tablet Take 10 mg by mouth daily.    [provider]  cholecalciferol (VITAMIN D-400) 400 UNITS TABS Take 400 Units by mouth daily.    [provider]  Cod Liver Oil 1000 MG CAPS Take 1 capsule by mouth daily.    [provider]  Enema Mineral Oil ENEM  Place 1 application rectally daily. 01/22/18   Wurst, [place], PA-C  fenofibrate 160 MG tablet Take 1 tablet by mouth daily. 02/06/19   [provider]  Garlic 10 MG CAPS Take 1 tablet by mouth daily.    [provider]  ibuprofen (ADVIL,MOTRIN) 200 MG tablet Take 400 mg by mouth every 6 (six) hours as needed for mild pain.    [provider]  irbesartan (AVAPRO) 150 MG tablet Take 1 tablet (150 mg total) by mouth daily. 02/10/19   Marinus Maw, MD  methotrexate 2.5 MG tablet Take 15 mg by mouth once a week. 08/14/21   [provider]  mineral oil-hydrophilic petrolatum (AQUAPHOR) ointment Apply topically as needed for dry skin. 05/29/22   Carlisle Beers, FNP  naproxen sodium (ALEVE) 220 MG tablet Take 220 mg by mouth.    [provider]  pantoprazole (PROTONIX) 20 MG tablet Take 1 tablet (20 mg total) by mouth daily. 08/26/20   Rhys Martini, PA-C  polyethylene glycol (MIRALAX / GLYCOLAX) packet Take 17 g by mouth daily. 01/22/18   Wurst, [place], PA-C  vitamin C (ASCORBIC ACID) 500 MG tablet Take 500 mg by mouth daily.    [provider]  omeprazole (PRILOSEC) 20 MG capsule Take 20 mg by mouth daily.  08/26/20  [provider]  Allergies    Macrobid [nitrofurantoin macrocrystal]    Review of Systems   Review of Systems  Physical Exam Updated Vital Signs BP (!) 155/80 (BP Location: Right Arm)   Pulse 79   Temp 98.1 F (36.7 C) (Oral)   Resp 18   SpO2 97%  Physical Exam Constitutional:      General: She is not in acute distress. HENT:     Head: Normocephalic and atraumatic.  Eyes:     Conjunctiva/sclera: Conjunctivae normal.     Pupils: Pupils are equal, round, and reactive to light.  Cardiovascular:     Rate and Rhythm: Normal rate and regular rhythm.  Pulmonary:     Effort: Pulmonary effort is normal. No respiratory distress.  Abdominal:     General: There is no distension.     Tenderness: There is no  abdominal tenderness.  Musculoskeletal:     Comments: Sensation intact in all dermatomes.  Patient is grip strength and finger abduction and abduction strength testing is intact.  She has no weakness to wrist flexion or extension.  She is able to actively extend her left elbow to full extension with minimal tenderness.  There is some pain with attempted overhead arm raise of the left arm, radiating from the shoulder down into her left lower arm.  No tenderness of the spinal midline  Skin:    General: Skin is warm and dry.  Neurological:     General: No focal deficit present.     Mental Status: She is alert. Mental status is at baseline.  Psychiatric:        Mood and Affect: Mood normal.        Behavior: Behavior normal.     ED Results / Procedures / Treatments   Labs (all labs ordered are listed, but only abnormal results are displayed) Labs Reviewed - No data to display  EKG None  Radiology No results found.  Procedures Procedures  {Document cardiac monitor, telemetry assessment procedure when appropriate:1}  Medications Ordered in ED Medications  morphine (PF) 4 MG/ML injection 4 mg (has no administration in time range)  ketorolac (TORADOL) 30 MG/ML injection 30 mg (has no administration in time range)    ED Course/ Medical Decision Making/ A&P   {   Click here for ABCD2, HEART and other calculatorsREFRESH Note before signing :1}                          Medical Decision Making Amount and/or Complexity of Data Reviewed Radiology: ordered.  Risk Prescription drug management.   Patient is here with left elbow and shoulder pain.  This may be related to radiculopathy versus degenerative disc disease or arthritis versus rheumatological disorder versus other.  She does not have a warm left elbow or joint infusion or risk factors to suggest that this is a septic elbow joint.  Her symptoms do appear to radiate from the shoulder down into the hand.  {Document critical care  time when appropriate:1} {Document review of labs and clinical decision tools ie heart score, Chads2Vasc2 etc:1}  {Document your independent review of radiology images, and any outside records:1} {Document your discussion with family members, caretakers, and with consultants:1} {Document social determinants of health affecting pt's care:1} {Document your decision making why or why not admission, treatments were needed:1} Final Clinical Impression(s) / ED Diagnoses Final diagnoses:  None    Rx / DC Orders ED Discharge Orders     None

## 2023-02-14 NOTE — ED Triage Notes (Signed)
Patient here today with c/o left shoulder pain that started last night. Patient has a h/o shoulder pain and has had injections 2 years ago which seemed to help. She tried taking Tylenol Arthritis with no relief. Last taken this morning at 5 am. No known injury.

## 2023-02-14 NOTE — ED Notes (Signed)
Patient is being discharged from the Urgent Care and sent to the Emergency Department via POV with family. Per Rennis Chris, NP, patient is in need of higher level of care due to possible septic joint. Patient is aware and verbalizes understanding of plan of care.  Vitals:   02/14/23 1722  BP: (!) 167/75  Pulse: 74  Resp: 16  Temp: 100.3 F (37.9 C)  SpO2: 98%

## 2023-02-14 NOTE — Progress Notes (Signed)
Orthopedic Tech Progress Note Patient Details:  Denise Hodges 1946-05-27 [id number]  Sling was handed to the RN at bedside as I was in the midst of multiple orders.  Ortho Devices Type of Ortho Device: Sling immobilizer Ortho Device/Splint Location: for LUE, given to RN Ortho Device/Splint Interventions: Ordered      Docia Furl 02/14/2023, 7:19 PM

## 2023-03-11 DIAGNOSIS — D509 Iron deficiency anemia, unspecified: Secondary | ICD-10-CM | POA: Diagnosis not present

## 2023-04-20 DIAGNOSIS — H17822 Peripheral opacity of cornea, left eye: Secondary | ICD-10-CM | POA: Diagnosis not present

## 2023-04-20 DIAGNOSIS — H04123 Dry eye syndrome of bilateral lacrimal glands: Secondary | ICD-10-CM | POA: Diagnosis not present

## 2023-04-20 DIAGNOSIS — H40013 Open angle with borderline findings, low risk, bilateral: Secondary | ICD-10-CM | POA: Diagnosis not present

## 2023-04-20 DIAGNOSIS — Z961 Presence of intraocular lens: Secondary | ICD-10-CM | POA: Diagnosis not present

## 2023-04-21 ENCOUNTER — Ambulatory Visit: Payer: Medicare PPO

## 2023-04-21 DIAGNOSIS — Z6822 Body mass index (BMI) 22.0-22.9, adult: Secondary | ICD-10-CM | POA: Diagnosis not present

## 2023-04-21 DIAGNOSIS — Z111 Encounter for screening for respiratory tuberculosis: Secondary | ICD-10-CM | POA: Diagnosis not present

## 2023-04-21 DIAGNOSIS — M25512 Pain in left shoulder: Secondary | ICD-10-CM | POA: Diagnosis not present

## 2023-04-21 DIAGNOSIS — I4430 Unspecified atrioventricular block: Secondary | ICD-10-CM

## 2023-04-21 DIAGNOSIS — M0579 Rheumatoid arthritis with rheumatoid factor of multiple sites without organ or systems involvement: Secondary | ICD-10-CM | POA: Diagnosis not present

## 2023-04-21 DIAGNOSIS — M1991 Primary osteoarthritis, unspecified site: Secondary | ICD-10-CM | POA: Diagnosis not present

## 2023-04-21 DIAGNOSIS — R110 Nausea: Secondary | ICD-10-CM | POA: Diagnosis not present

## 2023-04-21 DIAGNOSIS — M25521 Pain in right elbow: Secondary | ICD-10-CM | POA: Diagnosis not present

## 2023-04-21 DIAGNOSIS — R5383 Other fatigue: Secondary | ICD-10-CM | POA: Diagnosis not present

## 2023-04-21 DIAGNOSIS — K219 Gastro-esophageal reflux disease without esophagitis: Secondary | ICD-10-CM | POA: Diagnosis not present

## 2023-04-22 LAB — CUP PACEART REMOTE DEVICE CHECK
Battery Remaining Longevity: 25 mo
Battery Remaining Percentage: 25 %
Battery Voltage: 2.92 V
Brady Statistic AP VP Percent: 4.9 %
Brady Statistic AP VS Percent: 1.2 %
Brady Statistic AS VP Percent: 75 %
Brady Statistic AS VS Percent: 18 %
Brady Statistic RA Percent Paced: 5.4 %
Brady Statistic RV Percent Paced: 80 %
Date Time Interrogation Session: 20240923181207
Implantable Lead Connection Status: 753985
Implantable Lead Connection Status: 753985
Implantable Lead Implant Date: 20180404
Implantable Lead Implant Date: 20180404
Implantable Lead Location: 753859
Implantable Lead Location: 753860
Implantable Lead Model: 3830
Implantable Pulse Generator Implant Date: 20180404
Lead Channel Impedance Value: 560 Ohm
Lead Channel Impedance Value: 580 Ohm
Lead Channel Pacing Threshold Amplitude: 0.5 V
Lead Channel Pacing Threshold Amplitude: 0.5 V
Lead Channel Pacing Threshold Pulse Width: 0.4 ms
Lead Channel Pacing Threshold Pulse Width: 1 ms
Lead Channel Sensing Intrinsic Amplitude: 12 mV
Lead Channel Sensing Intrinsic Amplitude: 4.2 mV
Lead Channel Setting Pacing Amplitude: 2 V
Lead Channel Setting Pacing Amplitude: 2.5 V
Lead Channel Setting Pacing Pulse Width: 1 ms
Lead Channel Setting Sensing Sensitivity: 2 mV
Pulse Gen Model: 2272
Pulse Gen Serial Number: 8002284

## 2023-05-08 NOTE — Progress Notes (Signed)
Remote pacemaker transmission.   

## 2023-06-04 DIAGNOSIS — J0100 Acute maxillary sinusitis, unspecified: Secondary | ICD-10-CM | POA: Diagnosis not present

## 2023-06-04 DIAGNOSIS — R051 Acute cough: Secondary | ICD-10-CM | POA: Diagnosis not present

## 2023-06-24 DIAGNOSIS — M545 Low back pain, unspecified: Secondary | ICD-10-CM | POA: Diagnosis not present

## 2023-06-24 DIAGNOSIS — M7062 Trochanteric bursitis, left hip: Secondary | ICD-10-CM | POA: Diagnosis not present

## 2023-06-24 DIAGNOSIS — M533 Sacrococcygeal disorders, not elsewhere classified: Secondary | ICD-10-CM | POA: Diagnosis not present

## 2023-07-02 DIAGNOSIS — F17200 Nicotine dependence, unspecified, uncomplicated: Secondary | ICD-10-CM | POA: Diagnosis not present

## 2023-07-02 DIAGNOSIS — Z950 Presence of cardiac pacemaker: Secondary | ICD-10-CM | POA: Diagnosis not present

## 2023-07-02 DIAGNOSIS — K5901 Slow transit constipation: Secondary | ICD-10-CM | POA: Diagnosis not present

## 2023-07-02 DIAGNOSIS — I10 Essential (primary) hypertension: Secondary | ICD-10-CM | POA: Diagnosis not present

## 2023-07-02 DIAGNOSIS — M059 Rheumatoid arthritis with rheumatoid factor, unspecified: Secondary | ICD-10-CM | POA: Diagnosis not present

## 2023-07-02 DIAGNOSIS — I700 Atherosclerosis of aorta: Secondary | ICD-10-CM | POA: Diagnosis not present

## 2023-07-02 DIAGNOSIS — E782 Mixed hyperlipidemia: Secondary | ICD-10-CM | POA: Diagnosis not present

## 2023-07-02 DIAGNOSIS — G4700 Insomnia, unspecified: Secondary | ICD-10-CM | POA: Diagnosis not present

## 2023-07-02 DIAGNOSIS — I441 Atrioventricular block, second degree: Secondary | ICD-10-CM | POA: Diagnosis not present

## 2023-07-21 ENCOUNTER — Ambulatory Visit (INDEPENDENT_AMBULATORY_CARE_PROVIDER_SITE_OTHER): Payer: Medicare PPO

## 2023-07-21 DIAGNOSIS — I4430 Unspecified atrioventricular block: Secondary | ICD-10-CM | POA: Diagnosis not present

## 2023-07-21 LAB — CUP PACEART REMOTE DEVICE CHECK
Battery Remaining Longevity: 22 mo
Battery Remaining Percentage: 22 %
Battery Voltage: 2.9 V
Brady Statistic AP VP Percent: 4.9 %
Brady Statistic AP VS Percent: 1.2 %
Brady Statistic AS VP Percent: 76 %
Brady Statistic AS VS Percent: 17 %
Brady Statistic RA Percent Paced: 5.3 %
Brady Statistic RV Percent Paced: 81 %
Date Time Interrogation Session: 20241224020015
Implantable Lead Connection Status: 753985
Implantable Lead Connection Status: 753985
Implantable Lead Implant Date: 20180404
Implantable Lead Implant Date: 20180404
Implantable Lead Location: 753859
Implantable Lead Location: 753860
Implantable Lead Model: 3830
Implantable Pulse Generator Implant Date: 20180404
Lead Channel Impedance Value: 560 Ohm
Lead Channel Impedance Value: 560 Ohm
Lead Channel Pacing Threshold Amplitude: 0.5 V
Lead Channel Pacing Threshold Amplitude: 0.5 V
Lead Channel Pacing Threshold Pulse Width: 0.4 ms
Lead Channel Pacing Threshold Pulse Width: 1 ms
Lead Channel Sensing Intrinsic Amplitude: 12 mV
Lead Channel Sensing Intrinsic Amplitude: 5 mV
Lead Channel Setting Pacing Amplitude: 2 V
Lead Channel Setting Pacing Amplitude: 2.5 V
Lead Channel Setting Pacing Pulse Width: 1 ms
Lead Channel Setting Sensing Sensitivity: 2 mV
Pulse Gen Model: 2272
Pulse Gen Serial Number: 8002284

## 2023-07-23 DIAGNOSIS — Z6822 Body mass index (BMI) 22.0-22.9, adult: Secondary | ICD-10-CM | POA: Diagnosis not present

## 2023-07-23 DIAGNOSIS — M1991 Primary osteoarthritis, unspecified site: Secondary | ICD-10-CM | POA: Diagnosis not present

## 2023-07-23 DIAGNOSIS — K219 Gastro-esophageal reflux disease without esophagitis: Secondary | ICD-10-CM | POA: Diagnosis not present

## 2023-07-23 DIAGNOSIS — M25521 Pain in right elbow: Secondary | ICD-10-CM | POA: Diagnosis not present

## 2023-07-23 DIAGNOSIS — R110 Nausea: Secondary | ICD-10-CM | POA: Diagnosis not present

## 2023-07-23 DIAGNOSIS — M25512 Pain in left shoulder: Secondary | ICD-10-CM | POA: Diagnosis not present

## 2023-07-23 DIAGNOSIS — M0579 Rheumatoid arthritis with rheumatoid factor of multiple sites without organ or systems involvement: Secondary | ICD-10-CM | POA: Diagnosis not present

## 2023-08-17 DIAGNOSIS — M19012 Primary osteoarthritis, left shoulder: Secondary | ICD-10-CM | POA: Diagnosis not present

## 2023-08-28 ENCOUNTER — Encounter (HOSPITAL_COMMUNITY): Payer: Self-pay | Admitting: Physical Medicine and Rehabilitation

## 2023-08-28 DIAGNOSIS — M5416 Radiculopathy, lumbar region: Secondary | ICD-10-CM | POA: Diagnosis not present

## 2023-08-31 ENCOUNTER — Encounter (HOSPITAL_COMMUNITY): Payer: Self-pay | Admitting: Physical Medicine and Rehabilitation

## 2023-09-03 ENCOUNTER — Other Ambulatory Visit: Payer: Self-pay | Admitting: Physical Medicine and Rehabilitation

## 2023-09-03 DIAGNOSIS — M545 Low back pain, unspecified: Secondary | ICD-10-CM

## 2023-09-16 NOTE — Discharge Instructions (Signed)

## 2023-09-17 ENCOUNTER — Inpatient Hospital Stay
Admission: RE | Admit: 2023-09-17 | Discharge: 2023-09-17 | Disposition: A | Discharge status: Home / Self Care | Payer: Medicare PPO | Source: Ambulatory Visit | Attending: Physical Medicine and Rehabilitation | Admitting: Physical Medicine and Rehabilitation

## 2023-09-17 ENCOUNTER — Other Ambulatory Visit: Payer: Medicare PPO

## 2023-09-24 NOTE — Discharge Instructions (Signed)

## 2023-09-25 ENCOUNTER — Ambulatory Visit
Admission: RE | Admit: 2023-09-25 | Discharge: 2023-09-25 | Disposition: A | Discharge status: Home / Self Care | Payer: Medicare PPO | Source: Ambulatory Visit | Attending: Physical Medicine and Rehabilitation | Admitting: Physical Medicine and Rehabilitation

## 2023-09-25 DIAGNOSIS — M4807 Spinal stenosis, lumbosacral region: Secondary | ICD-10-CM | POA: Diagnosis not present

## 2023-09-25 DIAGNOSIS — M47816 Spondylosis without myelopathy or radiculopathy, lumbar region: Secondary | ICD-10-CM | POA: Diagnosis not present

## 2023-09-25 DIAGNOSIS — M545 Low back pain, unspecified: Secondary | ICD-10-CM

## 2023-09-25 DIAGNOSIS — M51362 Other intervertebral disc degeneration, lumbar region with discogenic back pain and lower extremity pain: Secondary | ICD-10-CM | POA: Diagnosis not present

## 2023-09-25 MED ORDER — IOPAMIDOL (ISOVUE-M 200) INJECTION 41%
20.0000 mL | Freq: Once | INTRAMUSCULAR | Status: AC
  Administered 2023-09-25: 20 mL via INTRATHECAL

## 2023-09-25 MED ORDER — MEPERIDINE HCL 50 MG/ML IJ SOLN: 50.0000 mg | Freq: Once | INTRAMUSCULAR | Status: DC | PRN

## 2023-09-25 MED ORDER — ONDANSETRON HCL 4 MG/2ML IJ SOLN: 4.0000 mg | Freq: Once | INTRAMUSCULAR | Status: DC | PRN

## 2023-09-25 MED ORDER — DIAZEPAM 5 MG PO TABS
5.0000 mg | ORAL_TABLET | Freq: Once | ORAL | Status: AC
  Administered 2023-09-25: 5 mg via ORAL

## 2023-10-02 DIAGNOSIS — M5416 Radiculopathy, lumbar region: Secondary | ICD-10-CM | POA: Diagnosis not present

## 2023-10-06 ENCOUNTER — Other Ambulatory Visit: Payer: Self-pay | Admitting: Physical Medicine and Rehabilitation

## 2023-10-06 DIAGNOSIS — M5416 Radiculopathy, lumbar region: Secondary | ICD-10-CM

## 2023-10-12 NOTE — Discharge Instructions (Signed)

## 2023-10-13 ENCOUNTER — Other Ambulatory Visit: Payer: Self-pay | Admitting: Physical Medicine and Rehabilitation

## 2023-10-13 ENCOUNTER — Ambulatory Visit
Admission: RE | Admit: 2023-10-13 | Discharge: 2023-10-13 | Disposition: A | Discharge status: Home / Self Care | Source: Ambulatory Visit | Attending: Physical Medicine and Rehabilitation | Admitting: Physical Medicine and Rehabilitation

## 2023-10-13 DIAGNOSIS — M5416 Radiculopathy, lumbar region: Secondary | ICD-10-CM

## 2023-10-13 DIAGNOSIS — M4807 Spinal stenosis, lumbosacral region: Secondary | ICD-10-CM | POA: Diagnosis not present

## 2023-10-13 DIAGNOSIS — M4727 Other spondylosis with radiculopathy, lumbosacral region: Secondary | ICD-10-CM | POA: Diagnosis not present

## 2023-10-13 DIAGNOSIS — M47817 Spondylosis without myelopathy or radiculopathy, lumbosacral region: Secondary | ICD-10-CM | POA: Diagnosis not present

## 2023-10-13 DIAGNOSIS — M5442 Lumbago with sciatica, left side: Secondary | ICD-10-CM | POA: Diagnosis not present

## 2023-10-13 MED ORDER — METHYLPREDNISOLONE ACETATE 40 MG/ML INJ SUSP (RADIOLOG
80.0000 mg | Freq: Once | INTRAMUSCULAR | Status: AC
  Administered 2023-10-13: 80 mg via EPIDURAL

## 2023-10-13 MED ORDER — IOPAMIDOL (ISOVUE-M 200) INJECTION 41%
1.0000 mL | Freq: Once | INTRAMUSCULAR | Status: AC
  Administered 2023-10-13: 1 mL via EPIDURAL

## 2023-10-20 ENCOUNTER — Ambulatory Visit (INDEPENDENT_AMBULATORY_CARE_PROVIDER_SITE_OTHER): Payer: Medicare PPO

## 2023-10-20 DIAGNOSIS — I4430 Unspecified atrioventricular block: Secondary | ICD-10-CM

## 2023-10-20 LAB — CUP PACEART REMOTE DEVICE CHECK
Battery Remaining Longevity: 19 mo
Battery Remaining Percentage: 19 %
Battery Voltage: 2.89 V
Brady Statistic AP VP Percent: 4.9 %
Brady Statistic AP VS Percent: 1.1 %
Brady Statistic AS VP Percent: 77 %
Brady Statistic AS VS Percent: 17 %
Brady Statistic RA Percent Paced: 5.3 %
Brady Statistic RV Percent Paced: 81 %
Date Time Interrogation Session: 20250325020012
Implantable Lead Connection Status: 753985
Implantable Lead Connection Status: 753985
Implantable Lead Implant Date: 20180404
Implantable Lead Implant Date: 20180404
Implantable Lead Location: 753859
Implantable Lead Location: 753860
Implantable Lead Model: 3830
Implantable Pulse Generator Implant Date: 20180404
Lead Channel Impedance Value: 580 Ohm
Lead Channel Impedance Value: 590 Ohm
Lead Channel Pacing Threshold Amplitude: 0.5 V
Lead Channel Pacing Threshold Amplitude: 0.5 V
Lead Channel Pacing Threshold Pulse Width: 0.4 ms
Lead Channel Pacing Threshold Pulse Width: 1 ms
Lead Channel Sensing Intrinsic Amplitude: 12 mV
Lead Channel Sensing Intrinsic Amplitude: 5 mV
Lead Channel Setting Pacing Amplitude: 2 V
Lead Channel Setting Pacing Amplitude: 2.5 V
Lead Channel Setting Pacing Pulse Width: 1 ms
Lead Channel Setting Sensing Sensitivity: 2 mV
Pulse Gen Model: 2272
Pulse Gen Serial Number: 8002284

## 2023-10-22 DIAGNOSIS — M1991 Primary osteoarthritis, unspecified site: Secondary | ICD-10-CM | POA: Diagnosis not present

## 2023-10-22 DIAGNOSIS — R110 Nausea: Secondary | ICD-10-CM | POA: Diagnosis not present

## 2023-10-22 DIAGNOSIS — Z6822 Body mass index (BMI) 22.0-22.9, adult: Secondary | ICD-10-CM | POA: Diagnosis not present

## 2023-10-22 DIAGNOSIS — K219 Gastro-esophageal reflux disease without esophagitis: Secondary | ICD-10-CM | POA: Diagnosis not present

## 2023-10-22 DIAGNOSIS — M25521 Pain in right elbow: Secondary | ICD-10-CM | POA: Diagnosis not present

## 2023-10-22 DIAGNOSIS — M5136 Other intervertebral disc degeneration, lumbar region with discogenic back pain only: Secondary | ICD-10-CM | POA: Diagnosis not present

## 2023-10-22 DIAGNOSIS — M25512 Pain in left shoulder: Secondary | ICD-10-CM | POA: Diagnosis not present

## 2023-10-22 DIAGNOSIS — M0579 Rheumatoid arthritis with rheumatoid factor of multiple sites without organ or systems involvement: Secondary | ICD-10-CM | POA: Diagnosis not present

## 2023-10-28 ENCOUNTER — Ambulatory Visit (HOSPITAL_COMMUNITY)
Admission: EM | Admit: 2023-10-28 | Discharge: 2023-10-28 | Disposition: A | Discharge status: Home / Self Care | Attending: Family Medicine | Admitting: Family Medicine

## 2023-10-28 ENCOUNTER — Encounter (HOSPITAL_COMMUNITY): Payer: Self-pay | Admitting: *Deleted

## 2023-10-28 ENCOUNTER — Other Ambulatory Visit: Payer: Self-pay

## 2023-10-28 DIAGNOSIS — N3000 Acute cystitis without hematuria: Secondary | ICD-10-CM | POA: Diagnosis not present

## 2023-10-28 DIAGNOSIS — R300 Dysuria: Secondary | ICD-10-CM | POA: Diagnosis not present

## 2023-10-28 LAB — POCT URINALYSIS DIP (MANUAL ENTRY)
Bilirubin, UA: NEGATIVE
Blood, UA: NEGATIVE
Glucose, UA: NEGATIVE mg/dL
Ketones, POC UA: NEGATIVE mg/dL
Leukocytes, UA: NEGATIVE
Nitrite, UA: NEGATIVE
Protein Ur, POC: NEGATIVE mg/dL
Spec Grav, UA: 1.01 (ref 1.010–1.025)
Urobilinogen, UA: 0.2 U/dL
pH, UA: 7 (ref 5.0–8.0)

## 2023-10-28 MED ORDER — PHENAZOPYRIDINE HCL 100 MG PO TABS: 100.0000 mg | ORAL_TABLET | Freq: Three times a day (TID) | ORAL | 0 refills | Status: DC | PRN

## 2023-10-28 MED ORDER — CEPHALEXIN 500 MG PO CAPS
500.0000 mg | ORAL_CAPSULE | Freq: Two times a day (BID) | ORAL | 0 refills | Status: AC
Start: 2023-10-28 — End: 2023-11-04

## 2023-10-28 NOTE — ED Provider Notes (Signed)
 [facility]    CSN: [id number] Arrival date & time: 10/28/23  1513      History   Chief Complaint Chief Complaint  Patient presents with   Dysuria    HPI Denise Hodges is a 78 y.o. female.   Patient presents with dysuria, bladder pressure, and urinary frequency/urgency that began on 3/31.  Denies hematuria, flank pain, severe abdominal pain, nausea, vomiting, fever and abnormal vaginal discharge.   Dysuria   Past Medical History:  Diagnosis Date   Allergy    Arthritis    Bradycardia    pacemaker placed in April 2018  [employer]   GERD (gastroesophageal reflux disease)    Hyperlipidemia    Hypertension    Seasonal allergies     Patient Active Problem List   Diagnosis Date Noted   Pacemaker 02/10/2019   Heart block, AV 10/29/2016   Mobitz type 2 second degree atrioventricular block 10/29/2016   Arthritis of shoulder 12/31/2012   HTN (hypertension) 10/22/2012   Pure hypercholesterolemia 10/22/2012    Past Surgical History:  Procedure Laterality Date   ABDOMINAL HYSTERECTOMY  1995   COLONOSCOPY  2013   brodie   FOOT SURGERY  1985   bilateral for flat feet with pain   PACEMAKER IMPLANT N/A 10/29/2016   Procedure: Pacemaker Implant;  Surgeon: Marinus Maw, MD;  Location: [facility];  Service: Cardiovascular;  Laterality: N/A;   POLYPECTOMY     TONSILLECTOMY  as child   TOTAL SHOULDER ARTHROPLASTY Right 12/30/2012   Procedure: RIGHT TOTAL SHOULDER ARTHROPLASTY;  Surgeon: Mable Paris, MD;  Location: [facility];  Service: Orthopedics;  Laterality: Right;  interscaline block   WISDOM TOOTH EXTRACTION      OB History   No obstetric history on file.      Home Medications    Prior to Admission medications   Medication Sig Start Date End Date Taking? Authorizing Provider  cephALEXin (KEFLEX) 500 MG capsule Take 1 capsule (500 mg total) by mouth 2 (two) times daily for 7 days. 10/28/23 11/04/23 Yes Wynonia Lawman A, NP   phenazopyridine (PYRIDIUM) 100 MG tablet Take 1 tablet (100 mg total) by mouth 3 (three) times daily as needed for pain. 10/28/23  Yes Wynonia Lawman A, NP  aspirin 81 MG tablet Take 81 mg by mouth daily.    [provider]  benzonatate (TESSALON) 100 MG capsule Take 1 capsule (100 mg total) by mouth 3 (three) times daily as needed for cough. 01/16/23   Garrison, [place] N, FNP  Calcium Carbonate-Vitamin D (CALCIUM 600 + D PO) Take 1 tablet by mouth daily.    [provider]  carvedilol (COREG) 6.25 MG tablet Take 1 tablet (6.25 mg total) by mouth 2 (two) times daily with a meal. 07/05/19   Marinus Maw, MD  cetirizine (ZYRTEC) 10 MG tablet Take 10 mg by mouth daily.    [provider]  cholecalciferol (VITAMIN D-400) 400 UNITS TABS Take 400 Units by mouth daily.    [provider]  Cod Liver Oil 1000 MG CAPS Take 1 capsule by mouth daily.    [provider]  Enema Mineral Oil ENEM Place 1 application rectally daily. 01/22/18   Wurst, [place], PA-C  fenofibrate 160 MG tablet Take 1 tablet by mouth daily. 02/06/19   [provider]  Garlic 10 MG CAPS Take 1 tablet by mouth daily.    [provider]  ibuprofen (ADVIL,MOTRIN) 200 MG tablet Take 400 mg by  mouth every 6 (six) hours as needed for mild pain.    [provider]  irbesartan (AVAPRO) 150 MG tablet Take 1 tablet (150 mg total) by mouth daily. 02/10/19   Marinus Maw, MD  methotrexate 2.5 MG tablet Take 15 mg by mouth once a week. 08/14/21   [provider]  mineral oil-hydrophilic petrolatum (AQUAPHOR) ointment Apply topically as needed for dry skin. 05/29/22   Carlisle Beers, FNP  naproxen sodium (ALEVE) 220 MG tablet Take 220 mg by mouth.    [provider]  oxyCODONE-acetaminophen (PERCOCET/ROXICET) 5-325 MG tablet Take 1 tablet by mouth every 8 (eight) hours as needed for up to 15 doses for severe pain. 02/14/23   Terald Sleeper, MD   pantoprazole (PROTONIX) 20 MG tablet Take 1 tablet (20 mg total) by mouth daily. 08/26/20   Rhys Martini, PA-C  polyethylene glycol (MIRALAX / GLYCOLAX) packet Take 17 g by mouth daily. 01/22/18   Wurst, [place], PA-C  vitamin C (ASCORBIC ACID) 500 MG tablet Take 500 mg by mouth daily.    [provider]  omeprazole (PRILOSEC) 20 MG capsule Take 20 mg by mouth daily.  08/26/20  [provider]    Family History Family History  Problem Relation Age of Onset   Dementia Mother    Colon cancer Maternal Aunt 59   Colon cancer Cousin 50       maternal   Rectal cancer Neg Hx    Stomach cancer Neg Hx    Colon polyps Neg Hx    Esophageal cancer Neg Hx     Social History Social History   Tobacco Use   Smoking status: Every Day    Current packs/day: 0.25    Types: Cigarettes   Smokeless tobacco: Never  Vaping Use   Vaping status: Never Used  Substance Use Topics   Alcohol use: No   Drug use: No     Allergies   Macrobid [nitrofurantoin macrocrystal]   Review of Systems Review of Systems  Genitourinary:  Positive for dysuria.   Per HPI  Physical Exam Triage Vital Signs ED Triage Vitals  Encounter Vitals Group     BP 10/28/23 1638 121/67     Systolic BP Percentile --      Diastolic BP Percentile --      Pulse Rate 10/28/23 1638 68     Resp 10/28/23 1638 18     Temp 10/28/23 1638 97.8 F (36.6 C)     Temp src --      SpO2 10/28/23 1638 97 %     Weight --      Height --      Head Circumference --      Peak Flow --      Pain Score 10/28/23 1636 8     Pain Loc --      Pain Education --      Exclude from Growth Chart --    No data found.  Updated Vital Signs BP 121/67   Pulse 68   Temp 97.8 F (36.6 C)   Resp 18   SpO2 97%   Visual Acuity Right Eye Distance:   Left Eye Distance:   Bilateral Distance:    Right Eye Near:   Left Eye Near:    Bilateral Near:     Physical Exam Vitals and nursing note reviewed.  Constitutional:       General: She is awake. She is not in acute distress.  Appearance: Normal appearance. She is well-developed and well-groomed. She is not ill-appearing.  Abdominal:     General: Abdomen is flat.     Palpations: Abdomen is soft.     Tenderness: There is abdominal tenderness in the suprapubic area. There is no right CVA tenderness, left CVA tenderness, guarding or rebound.  Skin:    General: Skin is warm and dry.  Neurological:     Mental Status: She is alert.  Psychiatric:        Behavior: Behavior is cooperative.      UC Treatments / Results  Labs (all labs ordered are listed, but only abnormal results are displayed) Labs Reviewed  POCT URINALYSIS DIP (MANUAL ENTRY) - Abnormal; Notable for the following components:      Result Value   Clarity, UA hazy (*)    All other components within normal limits  URINE CULTURE    EKG   Radiology No results found.  Procedures Procedures (including critical care time)  Medications Ordered in UC Medications - No data to display  Initial Impression / Assessment and Plan / UC Course  I have reviewed the triage vital signs and the nursing notes.  Pertinent labs & imaging results that were available during my care of the patient were reviewed by me and considered in my medical decision making (see chart for details).     Upon assessment suprapubic tenderness noted.  Urinalysis was insignificant, will send urine culture to confirm.  Patient states that she has had symptoms like this in the past and her urinalysis has been negative but her urine culture will show bacteria needing antibiotics.  Empirically treating for urinary tract infection with Keflex.  Prescribed Pyridium as needed for urinary pain.  Discussed follow-up and return precautions. Final Clinical Impressions(s) / UC Diagnoses   Final diagnoses:  Acute cystitis without hematuria  Dysuria     Discharge Instructions      Start taking Keflex twice daily for 7 days  for urinary tract infection coverage.  Your urine culture will come back over the next few days and someone will call and adjust the treatment plan as needed.  I have prescribed Pyridium that you can take 3 times daily as needed for urinary pain.  Make sure you drinking lots of water to help flush out urinary tract.  Return here or follow-up with primary care provider if your symptoms persist or worsen.    ED Prescriptions     Medication Sig Dispense Auth. Provider   cephALEXin (KEFLEX) 500 MG capsule Take 1 capsule (500 mg total) by mouth 2 (two) times daily for 7 days. 14 capsule Wynonia Lawman A, NP   phenazopyridine (PYRIDIUM) 100 MG tablet Take 1 tablet (100 mg total) by mouth 3 (three) times daily as needed for pain. 10 tablet Wynonia Lawman A, NP      PDMP not reviewed this encounter.   Wynonia Lawman A, NP 10/28/23 1715

## 2023-10-28 NOTE — Discharge Instructions (Signed)
 Start taking Keflex twice daily for 7 days for urinary tract infection coverage.  Your urine culture will come back over the next few days and someone will call and adjust the treatment plan as needed.  I have prescribed Pyridium that you can take 3 times daily as needed for urinary pain.  Make sure you drinking lots of water to help flush out urinary tract.  Return here or follow-up with primary care provider if your symptoms persist or worsen.

## 2023-10-28 NOTE — ED Triage Notes (Signed)
 PT reports dysuria since Monday.

## 2023-10-29 LAB — URINE CULTURE: Culture: <10000 — AB

## 2023-11-03 ENCOUNTER — Ambulatory Visit: Payer: Medicare PPO | Admitting: Internal Medicine

## 2023-11-04 ENCOUNTER — Telehealth (HOSPITAL_COMMUNITY): Payer: Self-pay

## 2023-11-04 ENCOUNTER — Encounter (HOSPITAL_COMMUNITY): Payer: Self-pay

## 2023-11-04 ENCOUNTER — Ambulatory Visit (HOSPITAL_COMMUNITY)
Admission: RE | Admit: 2023-11-04 | Discharge: 2023-11-04 | Disposition: A | Discharge status: Home / Self Care | Payer: Self-pay | Source: Ambulatory Visit | Attending: Emergency Medicine | Admitting: Emergency Medicine

## 2023-11-04 VITALS — BP 135/66 | HR 74 | Temp 98.3°F | Resp 18

## 2023-11-04 DIAGNOSIS — M5441 Lumbago with sciatica, right side: Secondary | ICD-10-CM | POA: Diagnosis not present

## 2023-11-04 DIAGNOSIS — R109 Unspecified abdominal pain: Secondary | ICD-10-CM

## 2023-11-04 DIAGNOSIS — M5442 Lumbago with sciatica, left side: Secondary | ICD-10-CM | POA: Diagnosis not present

## 2023-11-04 DIAGNOSIS — G8929 Other chronic pain: Secondary | ICD-10-CM

## 2023-11-04 DIAGNOSIS — M5417 Radiculopathy, lumbosacral region: Secondary | ICD-10-CM | POA: Diagnosis not present

## 2023-11-04 LAB — POCT URINALYSIS DIP (MANUAL ENTRY)
Bilirubin, UA: NEGATIVE
Blood, UA: NEGATIVE
Glucose, UA: NEGATIVE mg/dL
Ketones, POC UA: NEGATIVE mg/dL
Leukocytes, UA: NEGATIVE
Nitrite, UA: NEGATIVE
Protein Ur, POC: NEGATIVE mg/dL
Spec Grav, UA: 1.02
Urobilinogen, UA: 0.2 U/dL
pH, UA: 5.5

## 2023-11-04 MED ORDER — DEXAMETHASONE SODIUM PHOSPHATE 10 MG/ML IJ SOLN
INTRAMUSCULAR | Status: AC
  Filled 2023-11-04: qty 1

## 2023-11-04 MED ORDER — DEXAMETHASONE SODIUM PHOSPHATE 10 MG/ML IJ SOLN
5.0000 mg | Freq: Once | INTRAMUSCULAR | Status: AC
  Administered 2023-11-04: 5 mg via INTRAMUSCULAR

## 2023-11-04 NOTE — ED Triage Notes (Signed)
 Pt states seen here last week for dysuria and now its worse. C/o pain to lower abdomen, back and down legs. States feels like she needs to urinate but can't.

## 2023-11-04 NOTE — ED Provider Notes (Signed)
 [facility]    CSN: [id number] Arrival date & time: 11/04/23  7829      History   Chief Complaint Chief Complaint  Patient presents with   Dysuria    Entered by patient    HPI Denise Hodges is a 78 y.o. female.   Patient presents to clinic over concerns of continued bladder fullness, bilateral flank pain that radiates down the back of both of her legs and ongoing discomfort with urination.  Reports she has a history of kidney infections and is concerned that she has 1 now.  Has not had any fevers, nausea or vomiting.  Feels the need to urinate but has trouble emptying her bladder.   Was seen at this clinic on 4/2 and diagnosed with acute cystitis and started on Keflex.  Urine culture did not show evidence of UTI so Keflex was discontinued.  Patient had been taking the Pyridium without any relief.  Has a history of lumbosacral radiculopathy.  Advanced imaging on 3/18 shows L5 and S1 nerve root blocks and transforaminal epidural injections, potential for L5 and S1 impingement.  Patient denies inner leg numbness or incontinence. Back pain with ambulation.   The history is provided by the patient and medical records.  Dysuria   Past Medical History:  Diagnosis Date   Allergy    Arthritis    Bradycardia    pacemaker placed in April 2018  Medtronic   GERD (gastroesophageal reflux disease)    Hyperlipidemia    Hypertension    Seasonal allergies     Patient Active Problem List   Diagnosis Date Noted   Pacemaker 02/10/2019   Heart block, AV 10/29/2016   Mobitz type 2 second degree atrioventricular block 10/29/2016   Arthritis of shoulder 12/31/2012   HTN (hypertension) 10/22/2012   Pure hypercholesterolemia 10/22/2012    Past Surgical History:  Procedure Laterality Date   ABDOMINAL HYSTERECTOMY  1995   COLONOSCOPY  2013   brodie   FOOT SURGERY  1985   bilateral for flat feet with pain   PACEMAKER IMPLANT N/A 10/29/2016   Procedure: Pacemaker Implant;   Surgeon: Marinus Maw, MD;  Location: [facility];  Service: Cardiovascular;  Laterality: N/A;   POLYPECTOMY     TONSILLECTOMY  as child   TOTAL SHOULDER ARTHROPLASTY Right 12/30/2012   Procedure: RIGHT TOTAL SHOULDER ARTHROPLASTY;  Surgeon: Mable Paris, MD;  Location: [facility];  Service: Orthopedics;  Laterality: Right;  interscaline block   WISDOM TOOTH EXTRACTION      OB History   No obstetric history on file.      Home Medications    Prior to Admission medications   Medication Sig Start Date End Date Taking? Authorizing Provider  aspirin 81 MG tablet Take 81 mg by mouth daily.    [provider]  Calcium Carbonate-Vitamin D (CALCIUM 600 + D PO) Take 1 tablet by mouth daily.    [provider]  carvedilol (COREG) 6.25 MG tablet Take 1 tablet (6.25 mg total) by mouth 2 (two) times daily with a meal. 07/05/19   Marinus Maw, MD  cephALEXin (KEFLEX) 500 MG capsule Take 1 capsule (500 mg total) by mouth 2 (two) times daily for 7 days. 10/28/23 11/04/23  Wynonia Lawman A, NP  cetirizine (ZYRTEC) 10 MG tablet Take 10 mg by mouth daily.    [provider]  cholecalciferol (VITAMIN D-400) 400 UNITS TABS Take 400 Units by mouth daily.    [provider]  Cod Liver Oil 1000 MG CAPS Take 1 capsule by mouth daily.    [provider]  Enema Mineral Oil ENEM Place 1 application rectally daily. 01/22/18   Wurst, [place], PA-C  fenofibrate 160 MG tablet Take 1 tablet by mouth daily. 02/06/19   [provider]  Garlic 10 MG CAPS Take 1 tablet by mouth daily.    [provider]  ibuprofen (ADVIL,MOTRIN) 200 MG tablet Take 400 mg by mouth every 6 (six) hours as needed for mild pain.    [provider]  irbesartan (AVAPRO) 150 MG tablet Take 1 tablet (150 mg total) by mouth daily. 02/10/19   Marinus Maw, MD  methotrexate 2.5 MG tablet Take 15 mg by mouth once a week. 08/14/21   [provider]  mineral  oil-hydrophilic petrolatum (AQUAPHOR) ointment Apply topically as needed for dry skin. 05/29/22   Carlisle Beers, FNP  naproxen sodium (ALEVE) 220 MG tablet Take 220 mg by mouth.    [provider]  pantoprazole (PROTONIX) 20 MG tablet Take 1 tablet (20 mg total) by mouth daily. 08/26/20   Rhys Martini, PA-C  phenazopyridine (PYRIDIUM) 100 MG tablet Take 1 tablet (100 mg total) by mouth 3 (three) times daily as needed for pain. 10/28/23   Wynonia Lawman A, NP  polyethylene glycol (MIRALAX / GLYCOLAX) packet Take 17 g by mouth daily. 01/22/18   Wurst, [place], PA-C  vitamin C (ASCORBIC ACID) 500 MG tablet Take 500 mg by mouth daily.    [provider]  omeprazole (PRILOSEC) 20 MG capsule Take 20 mg by mouth daily.  08/26/20  [provider]    Family History Family History  Problem Relation Age of Onset   Dementia Mother    Colon cancer Maternal Aunt 74   Colon cancer Cousin 50       maternal   Rectal cancer Neg Hx    Stomach cancer Neg Hx    Colon polyps Neg Hx    Esophageal cancer Neg Hx     Social History Social History   Tobacco Use   Smoking status: Every Day    Current packs/day: 0.25    Types: Cigarettes   Smokeless tobacco: Never  Vaping Use   Vaping status: Never Used  Substance Use Topics   Alcohol use: No   Drug use: No     Allergies   Macrobid [nitrofurantoin macrocrystal]   Review of Systems Review of Systems  Per HPI  Physical Exam Triage Vital Signs ED Triage Vitals  Encounter Vitals Group     BP 11/04/23 0841 135/66     Systolic BP Percentile --      Diastolic BP Percentile --      Pulse Rate 11/04/23 0841 74     Resp 11/04/23 0841 18     Temp 11/04/23 0841 98.3 F (36.8 C)     Temp Source 11/04/23 0841 Oral     SpO2 11/04/23 0841 98 %     Weight --      Height --      Head Circumference --      Peak Flow --      Pain Score 11/04/23 0842 9     Pain Loc --      Pain Education --      Exclude from Growth  Chart --    No data found.  Updated Vital Signs BP 135/66 (BP Location: Left Arm)   Pulse 74   Temp 98.3 F (  36.8 C) (Oral)   Resp 18   SpO2 98%   Visual Acuity Right Eye Distance:   Left Eye Distance:   Bilateral Distance:    Right Eye Near:   Left Eye Near:    Bilateral Near:     Physical Exam Vitals and nursing note reviewed.  Constitutional:      Appearance: Normal appearance.  HENT:     Head: Normocephalic and atraumatic.     Right Ear: External ear normal.     Left Ear: External ear normal.     Nose: Nose normal.     Mouth/Throat:     Mouth: Mucous membranes are moist.  Eyes:     Conjunctiva/sclera: Conjunctivae normal.  Cardiovascular:     Rate and Rhythm: Normal rate.  Pulmonary:     Effort: Pulmonary effort is normal. No respiratory distress.  Abdominal:     Tenderness: There is no right CVA tenderness or left CVA tenderness.  Musculoskeletal:        General: No tenderness. Normal range of motion.     Comments: Spine w/o step off, deformity, or TTP. Atraumatic.   Skin:    General: Skin is warm and dry.  Neurological:     General: No focal deficit present.     Mental Status: She is alert.  Psychiatric:        Mood and Affect: Mood normal.        Behavior: Behavior is cooperative.      UC Treatments / Results  Labs (all labs ordered are listed, but only abnormal results are displayed) Labs Reviewed  POCT URINALYSIS DIP (MANUAL ENTRY) - Normal  URINE CULTURE    EKG   Radiology No results found.  Procedures Procedures (including critical care time)  Medications Ordered in UC Medications  dexamethasone (DECADRON) injection 5 mg (5 mg Intramuscular Given 11/04/23 0919)    Initial Impression / Assessment and Plan / UC Course  I have reviewed the triage vital signs and the nursing notes.  Pertinent labs & imaging results that were available during my care of the patient were reviewed by me and considered in my medical decision making (see  chart for details).  Vitals in triage reviewed, patient is hemodynamically stable.  Afebrile, without tachycardia or CVA tenderness, low concern for pyelonephritis with negative urine dipstick in clinic.  Will send for culture to ensure no UTI.  Discussed that epidural injection versus lumbar radiculopathy may be the cause of her symptoms.  Spine w/o step off, deformity or TTP, imaging deferred. Without inner leg numbness or incontinence, low concern for cauda equina at this time.  Will trial intramuscular steroid injection and encourage orthopedic follow-up.  Strict emergency precautions given if symptoms evolve.  Plan of care, follow-up care return precautions given, no questions at this time.     Final Clinical Impressions(s) / UC Diagnoses   Final diagnoses:  Chronic bilateral low back pain with bilateral sciatica  Flank pain  Lumbosacral radiculopathy     Discharge Instructions      Your urine sample did not show any signs of infection.  We are sending this off for culture and we will contact you if the results require antibiotics.  We have given you a steroid injection to see if this will help your low back pain that radiates down both of your legs.  Please follow-up with your orthopedic provider for further investigation into your symptoms.  Seek immediate care at the nearest emergency department if you develop in  her leg numbness, incontinence, or any new concerning symptoms.     ED Prescriptions   None    PDMP not reviewed this encounter.   Rinaldo Ratel [place], [place] 11/04/23 [phone]

## 2023-11-04 NOTE — Discharge Instructions (Addendum)
 Your urine sample did not show any signs of infection.  We are sending this off for culture and we will contact you if the results require antibiotics.  We have given you a steroid injection to see if this will help your low back pain that radiates down both of your legs.  Please follow-up with your orthopedic provider for further investigation into your symptoms.  Seek immediate care at the nearest emergency department if you develop in her leg numbness, incontinence, or any new concerning symptoms.

## 2023-11-06 ENCOUNTER — Telehealth (HOSPITAL_COMMUNITY): Payer: Self-pay

## 2023-11-06 LAB — URINE CULTURE: Culture: 40000 — AB

## 2023-11-06 NOTE — Telephone Encounter (Signed)
 Called and spoke with the patient giving the information relayed below. Patient verbalized understanding and scheduled appointments for one injection a day for 3 days. Patient read back appointment dates and times to confirm.

## 2023-11-06 NOTE — Telephone Encounter (Signed)
 Hildred Priest, RN    11/06/23  4:40 PM Result Note Due to culture and interaction with current medications- dr Marlinda Mike recommends the pt coming in for rocephin injections. Anastasia Tompson CMA calling patient to inform her. Urine Culture

## 2023-11-06 NOTE — Telephone Encounter (Signed)
 hey I am trying to call in bactrim for her uti but its giving me an interaction with methotrexate. can I continue with that?  3:53 PM PB Zenia Resides, MD lemme look  3:55 PM CC Hildred Priest, RN thanks!  3:55 PM PB Zenia Resides, MD I think that interaction and the interaction with cipro are important enough that we should not use either one. She is allergic to macrobid, plus she is 78. Could she come in for Ceftriaxone 1000 mg IM daily x 3 days?  4:01 PM CC Hildred Priest, RN could you ask one of the clinical team members to call and ask her? I am going to add in [place] now.  39 mins You were added by Hildred Priest, RN. 39 mins CC Hildred Priest, RN Joanne Gavel can yo ucall this patient and let her know the antibiotic we were going to call in will interact with her medication so we need her to come in for injections. if you have any questions yo ucan ask dr Marlinda Mike!

## 2023-11-07 ENCOUNTER — Ambulatory Visit (HOSPITAL_COMMUNITY)

## 2023-11-07 ENCOUNTER — Encounter (HOSPITAL_COMMUNITY): Payer: Self-pay

## 2023-11-07 ENCOUNTER — Ambulatory Visit (HOSPITAL_COMMUNITY)
Admission: EM | Admit: 2023-11-07 | Discharge: 2023-11-07 | Disposition: A | Discharge status: Home / Self Care | Attending: Emergency Medicine | Admitting: Emergency Medicine

## 2023-11-07 DIAGNOSIS — B9620 Unspecified Escherichia coli [E. coli] as the cause of diseases classified elsewhere: Secondary | ICD-10-CM

## 2023-11-07 DIAGNOSIS — N3000 Acute cystitis without hematuria: Secondary | ICD-10-CM

## 2023-11-07 MED ORDER — CEFTRIAXONE SODIUM 1 G IJ SOLR
1.0000 g | Freq: Once | INTRAMUSCULAR | Status: AC
  Administered 2023-11-07: 1 g via INTRAMUSCULAR

## 2023-11-07 MED ORDER — LIDOCAINE HCL (PF) 1 % IJ SOLN
INTRAMUSCULAR | Status: AC
  Filled 2023-11-07: qty 2

## 2023-11-07 MED ORDER — CEFTRIAXONE SODIUM 1 G IJ SOLR
INTRAMUSCULAR | Status: AC
  Filled 2023-11-07: qty 10

## 2023-11-07 NOTE — ED Triage Notes (Signed)
 Patient here to receive Rocephin Injection per Dr. Ellsworth Haas.

## 2023-11-08 ENCOUNTER — Other Ambulatory Visit: Payer: Self-pay

## 2023-11-08 ENCOUNTER — Ambulatory Visit (HOSPITAL_COMMUNITY)
Admission: RE | Admit: 2023-11-08 | Discharge: 2023-11-08 | Disposition: A | Discharge status: Home / Self Care | Source: Ambulatory Visit | Attending: Family Medicine | Admitting: Family Medicine

## 2023-11-08 ENCOUNTER — Encounter (HOSPITAL_COMMUNITY): Payer: Self-pay

## 2023-11-08 VITALS — BP 138/71 | HR 69 | Temp 98.4°F | Resp 20

## 2023-11-08 DIAGNOSIS — B9620 Unspecified Escherichia coli [E. coli] as the cause of diseases classified elsewhere: Secondary | ICD-10-CM | POA: Diagnosis not present

## 2023-11-08 DIAGNOSIS — N390 Urinary tract infection, site not specified: Secondary | ICD-10-CM

## 2023-11-08 MED ORDER — STERILE WATER FOR INJECTION IJ SOLN
INTRAMUSCULAR | Status: AC
  Filled 2023-11-08: qty 10

## 2023-11-08 MED ORDER — CEFTRIAXONE SODIUM 1 G IJ SOLR
INTRAMUSCULAR | Status: AC
  Filled 2023-11-08: qty 10

## 2023-11-08 MED ORDER — CEFTRIAXONE SODIUM 1 G IJ SOLR
1.0000 g | Freq: Once | INTRAMUSCULAR | Status: AC
  Administered 2023-11-08: 1 g via INTRAMUSCULAR

## 2023-11-08 NOTE — ED Triage Notes (Signed)
 PT returns today for 2nd rocephin injection.

## 2023-11-09 ENCOUNTER — Ambulatory Visit (HOSPITAL_COMMUNITY)
Admission: RE | Admit: 2023-11-09 | Discharge: 2023-11-09 | Disposition: A | Discharge status: Home / Self Care | Source: Ambulatory Visit | Attending: Family Medicine | Admitting: Family Medicine

## 2023-11-09 ENCOUNTER — Encounter (HOSPITAL_COMMUNITY): Payer: Self-pay

## 2023-11-09 VITALS — BP 135/69 | HR 60 | Temp 98.2°F | Resp 16

## 2023-11-09 DIAGNOSIS — M5416 Radiculopathy, lumbar region: Secondary | ICD-10-CM | POA: Diagnosis not present

## 2023-11-09 DIAGNOSIS — R3989 Other symptoms and signs involving the genitourinary system: Secondary | ICD-10-CM

## 2023-11-09 DIAGNOSIS — N390 Urinary tract infection, site not specified: Secondary | ICD-10-CM | POA: Diagnosis not present

## 2023-11-09 DIAGNOSIS — B9620 Unspecified Escherichia coli [E. coli] as the cause of diseases classified elsewhere: Secondary | ICD-10-CM

## 2023-11-09 MED ORDER — LIDOCAINE HCL (PF) 1 % IJ SOLN
INTRAMUSCULAR | Status: AC
  Filled 2023-11-09: qty 2

## 2023-11-09 MED ORDER — CEFTRIAXONE SODIUM 1 G IJ SOLR
1.0000 g | Freq: Once | INTRAMUSCULAR | Status: AC
  Administered 2023-11-09: 1 g via INTRAMUSCULAR

## 2023-11-09 MED ORDER — CEFTRIAXONE SODIUM 1 G IJ SOLR
INTRAMUSCULAR | Status: AC
  Filled 2023-11-09: qty 10

## 2023-11-09 NOTE — Discharge Instructions (Addendum)
 Please make an appointment with the spinal surgeon.  Please take your gabapentin 300 mg in the morning and 300 mg in the evening.  You can increase it to 300 mg morning, lunchtime and evening if you feel like you are not having any benefit.  I would then follow-up with your PCP to see if you need further titration of your gabapentin.

## 2023-11-09 NOTE — ED Provider Notes (Signed)
 [facility]    CSN: [id number] Arrival date & time: 11/09/23  8657      History   Chief Complaint Chief Complaint  Patient presents with   Injection    HPI Denise Hodges is a 78 y.o. female.   Patient is presenting for persistent burning of her bladder area.  Patient notes that she does have a history of lumbar stenosis and recently had a spinal injection.  Patient notes that after her spinal injection she felt like most of her symptoms started including the bladder burning.  Patient notes that her bladder hurts most when it is fully stretched and that even after she urinates she still has a burning sensation over the bladder area.  Patient denies any foul-smelling urine or itchiness down there.  Patient states that she has had UTIs in the past but this feels different.  Patient also notes that her left leg pain is worse which is starting from the back and radiates down into the leg.  Patient denies any fevers or chills.  Patient otherwise feels fine other than the burning sensation.     Past Medical History:  Diagnosis Date   Allergy    Arthritis    Bradycardia    pacemaker placed in April 2018  [employer]tr[employer]   GERD (gastroesophageal reflux disease)    Hyperlipidemia    Hypertension    Seasonal allergies     Patient Active Problem List   Diagnosis Date Noted   Pacemaker 02/10/2019   Heart block, AV 10/29/2016   Mobitz type 2 second degree atrioventricular block 10/29/2016   Arthritis of shoulder 12/31/2012   HTN (hypertension) 10/22/2012   Pure hypercholesterolemia 10/22/2012    Past Surgical History:  Procedure Laterality Date   ABDOMINAL HYSTERECTOMY  1995   COLONOSCOPY  2013   brodie   FOOT SURGERY  1985   bilateral for flat feet with pain   PACEMAKER IMPLANT N/A 10/29/2016   Procedure: Pacemaker Implant;  Surgeon: Tammie Fall, MD;  Location: [facility];  Service: Cardiovascular;  Laterality: N/A;   POLYPECTOMY     TONSILLECTOMY  as child    TOTAL SHOULDER ARTHROPLASTY Right 12/30/2012   Procedure: RIGHT TOTAL SHOULDER ARTHROPLASTY;  Surgeon: Derald Flattery, MD;  Location: [facility];  Service: Orthopedics;  Laterality: Right;  interscaline block   WISDOM TOOTH EXTRACTION      OB History   No obstetric history on file.      Home Medications    Prior to Admission medications   Medication Sig Start Date End Date Taking? Authorizing Provider  aspirin 81 MG tablet Take 81 mg by mouth daily.    [provider]  Calcium Carbonate-Vitamin D (CALCIUM 600 + D PO) Take 1 tablet by mouth daily.    [provider]  carvedilol (COREG) 6.25 MG tablet Take 1 tablet (6.25 mg total) by mouth 2 (two) times daily with a meal. 07/05/19   Tammie Fall, MD  cetirizine (ZYRTEC) 10 MG tablet Take 10 mg by mouth daily.    [provider]  cholecalciferol (VITAMIN D-400) 400 UNITS TABS Take 400 Units by mouth daily.    [provider]  Cod Liver Oil 1000 MG CAPS Take 1 capsule by mouth daily.    [provider]  Enema Mineral Oil ENEM Place 1 application rectally daily. 01/22/18   Wurst, [place], PA-C  fenofibrate 160 MG tablet Take 1 tablet by mouth daily. 02/06/19   [provider]  Garlic 10 MG CAPS Take 1 tablet by mouth daily.    [provider]  ibuprofen (ADVIL,MOTRIN) 200 MG tablet Take 400 mg by mouth every 6 (six) hours as needed for mild pain.    [provider]  irbesartan (AVAPRO) 150 MG tablet Take 1 tablet (150 mg total) by mouth daily. 02/10/19   Tammie Fall, MD  methotrexate 2.5 MG tablet Take 15 mg by mouth once a week. 08/14/21   [provider]  mineral oil-hydrophilic petrolatum (AQUAPHOR) ointment Apply topically as needed for dry skin. 05/29/22   Starlene Eaton, FNP  naproxen sodium (ALEVE) 220 MG tablet Take 220 mg by mouth.    [provider]  pantoprazole (PROTONIX) 20 MG tablet Take 1 tablet (20 mg total) by mouth daily.  08/26/20   Graham, Laura E, PA-C  phenazopyridine (PYRIDIUM) 100 MG tablet Take 1 tablet (100 mg total) by mouth 3 (three) times daily as needed for pain. 10/28/23   Levora Reas A, NP  polyethylene glycol (MIRALAX / GLYCOLAX) packet Take 17 g by mouth daily. 01/22/18   Wurst, [place], PA-C  vitamin C (ASCORBIC ACID) 500 MG tablet Take 500 mg by mouth daily.    [provider]  omeprazole (PRILOSEC) 20 MG capsule Take 20 mg by mouth daily.  08/26/20  [provider]    Family History Family History  Problem Relation Age of Onset   Dementia Mother    Colon cancer Maternal Aunt 49   Colon cancer Cousin 50       maternal   Rectal cancer Neg Hx    Stomach cancer Neg Hx    Colon polyps Neg Hx    Esophageal cancer Neg Hx     Social History Social History   Tobacco Use   Smoking status: Every Day    Current packs/day: 0.25    Types: Cigarettes   Smokeless tobacco: Never  Vaping Use   Vaping status: Never Used  Substance Use Topics   Alcohol use: No   Drug use: No     Allergies   Macrobid [nitrofurantoin macrocrystal]   Review of Systems Review of Systems   Physical Exam Triage Vital Signs ED Triage Vitals  Encounter Vitals Group     BP 11/09/23 1007 135/69     Systolic BP Percentile --      Diastolic BP Percentile --      Pulse Rate 11/09/23 1007 60     Resp 11/09/23 1007 16     Temp 11/09/23 1007 98.2 F (36.8 C)     Temp Source 11/09/23 1007 Oral     SpO2 11/09/23 1007 97 %     Weight --      Height --      Head Circumference --      Peak Flow --      Pain Score 11/09/23 0954 9     Pain Loc --      Pain Education --      Exclude from Growth Chart --    No data found.  Updated Vital Signs BP 135/69 (BP Location: Left Arm)   Pulse 60   Temp 98.2 F (36.8 C) (Oral)   Resp 16   SpO2 97%   Visual Acuity Right Eye Distance:   Left Eye Distance:   Bilateral Distance:    Right Eye Near:   Left Eye Near:    Bilateral Near:      Physical Exam There is some mild tenderness  to palpation over the bladder area, no rebound or guarding.  No flank pain noted.  There is some positive straight leg test on the left side.  UC Treatments / Results  Labs (all labs ordered are listed, but only abnormal results are displayed) Labs Reviewed - No data to display  EKG   Radiology No results found.  Procedures Procedures (including critical care time)  Medications Ordered in UC Medications  cefTRIAXone (ROCEPHIN) injection 1 g (has no administration in time range)    Initial Impression / Assessment and Plan / UC Course  I have reviewed the triage vital signs and the nursing notes.  Pertinent labs & imaging results that were available during my care of the patient were reviewed by me and considered in my medical decision making (see chart for details).     I had a discussion with patient regarding her symptoms.  Patient noted to have some E. coli on previous culture that was sensitive to ceftriaxone.  Patient notes no change in her symptoms despite previous Keflex usage as well as ceftriaxone usage now.  Patient notes that her pain does not feel similar to UTI and is more a constant burning.  Patient noted to have significant lumbar compression with no improvement after epidural steroid injection.  I do believe that patient's symptoms may be coming from her spine as she has completed 2 rounds of antibiotics.  Patient has no other systemic symptoms at this time such as fever, chills or anything that would make worsening her infection is worsening.  Patient will go ahead and finish Rocephin injection for the last time today.  I will start patient on gabapentin and patient will follow-up with her PCP to see if it needs to be titrated.  Patient does have a history of rheumatoid arthritis and takes prednisone 5 mg daily and methotrexate 2.5 mg daily.  Patient was advised that if she does develop any fevers or chills she needs to  come back as we will likely need to find a source of her fever.  No other concerns at this time. Final Clinical Impressions(s) / UC Diagnoses   Final diagnoses:  E-coli UTI   Discharge Instructions   None    ED Prescriptions   None    PDMP not reviewed this encounter.   Jude Norton, MD 11/09/23 1104

## 2023-11-09 NOTE — ED Triage Notes (Addendum)
 Patient here today for Rocephin injection. Patient states that last night she has increased pain last night and is unsure if the injections are helping. Patient is having pain currently. The pain is burning in her lower abd and radiates down her legs.

## 2023-11-10 MED ORDER — GABAPENTIN 300 MG PO CAPS: 300.0000 mg | ORAL_CAPSULE | Freq: Three times a day (TID) | ORAL | 0 refills | Status: DC

## 2023-11-10 NOTE — Telephone Encounter (Signed)
 Patient seen in urgent care on 11/09/2023.  Provider intended to provide patient with a prescription for gabapentin which was not sent.  Prescription sent today by me.

## 2023-11-13 ENCOUNTER — Ambulatory Visit: Attending: Cardiology | Admitting: Internal Medicine

## 2023-11-13 ENCOUNTER — Encounter: Payer: Self-pay | Admitting: Internal Medicine

## 2023-11-13 VITALS — BP 124/56 | HR 76 | Ht 64.5 in | Wt 125.2 lb

## 2023-11-13 DIAGNOSIS — I4430 Unspecified atrioventricular block: Secondary | ICD-10-CM | POA: Diagnosis not present

## 2023-11-13 LAB — CUP PACEART INCLINIC DEVICE CHECK
Battery Remaining Longevity: 19 mo
Battery Voltage: 2.89 V
Brady Statistic RA Percent Paced: 5.2 %
Brady Statistic RV Percent Paced: 82 %
Date Time Interrogation Session: 20250418162145
Implantable Lead Connection Status: 753985
Implantable Lead Connection Status: 753985
Implantable Lead Implant Date: 20180404
Implantable Lead Implant Date: 20180404
Implantable Lead Location: 753859
Implantable Lead Location: 753860
Implantable Lead Model: 3830
Implantable Pulse Generator Implant Date: 20180404
Lead Channel Impedance Value: 575 Ohm
Lead Channel Impedance Value: 587.5 Ohm
Lead Channel Pacing Threshold Amplitude: 0.5 V
Lead Channel Pacing Threshold Amplitude: 0.5 V
Lead Channel Pacing Threshold Amplitude: 0.5 V
Lead Channel Pacing Threshold Amplitude: 0.5 V
Lead Channel Pacing Threshold Pulse Width: 0.4 ms
Lead Channel Pacing Threshold Pulse Width: 0.4 ms
Lead Channel Pacing Threshold Pulse Width: 1 ms
Lead Channel Pacing Threshold Pulse Width: 1 ms
Lead Channel Sensing Intrinsic Amplitude: 12 mV
Lead Channel Sensing Intrinsic Amplitude: 5 mV
Lead Channel Setting Pacing Amplitude: 2 V
Lead Channel Setting Pacing Amplitude: 2 V
Lead Channel Setting Pacing Pulse Width: 1 ms
Lead Channel Setting Sensing Sensitivity: 2 mV
Pulse Gen Model: 2272
Pulse Gen Serial Number: 8002284

## 2023-11-13 MED ORDER — VARENICLINE TARTRATE 0.5 MG PO TABS: ORAL_TABLET | ORAL | 0 refills | Status: AC

## 2023-11-13 NOTE — Progress Notes (Signed)
 HPI [name] returns today to re-establish. She has a h/o 2:1 AV block, s/p PPM insertion. In the interim, she has done well with no chest pain or sob. No syncope or edema. She denies palpitations. She continues to smoke but less than a half pack a day. She would like to stop smoking.  Allergies  Allergen Reactions   Macrobid [Nitrofurantoin Macrocrystal] Hives and Shortness Of Breath     Current Outpatient Medications  Medication Sig Dispense Refill   aspirin  81 MG tablet Take 81 mg by mouth daily.     Calcium Carbonate-Vitamin D (CALCIUM 600 + D PO) Take 1 tablet by mouth daily.     carvedilol  (COREG ) 12.5 MG tablet Take 12.5 mg by mouth 2 (two) times daily.     cetirizine (ZYRTEC) 10 MG tablet Take 10 mg by mouth daily.     cholecalciferol (VITAMIN D-400) 400 UNITS TABS Take 400 Units by mouth daily.     Cod Liver Oil 1000 MG CAPS Take 1 capsule by mouth daily.     Enema Mineral Oil ENEM Place 1 application rectally daily. 133 mL 0   fenofibrate  160 MG tablet Take 1 tablet by mouth daily.     gabapentin  (NEURONTIN ) 300 MG capsule Take 1 capsule (300 mg total) by mouth 3 (three) times daily. 90 capsule 0   Garlic 10 MG CAPS Take 1 tablet by mouth daily.     ibuprofen  (ADVIL ,MOTRIN ) 200 MG tablet Take 400 mg by mouth every 6 (six) hours as needed for mild pain.     irbesartan  (AVAPRO ) 300 MG tablet Take 300 mg by mouth daily.     methotrexate 2.5 MG tablet Take 15 mg by mouth once a week.     mineral oil-hydrophilic petrolatum  (AQUAPHOR) ointment Apply topically as needed for dry skin. 50 g 0   naproxen sodium (ALEVE) 220 MG tablet Take 220 mg by mouth.     pantoprazole  (PROTONIX ) 40 MG tablet Take by mouth.     phenazopyridine  (PYRIDIUM ) 100 MG tablet Take 1 tablet (100 mg total) by mouth 3 (three) times daily as needed for pain. 10 tablet 0   polyethylene glycol (MIRALAX  / GLYCOLAX ) packet Take 17 g by mouth daily. 14 each 0   verapamil (CALAN-SR) 120 MG CR tablet Take  120 mg by mouth daily.     vitamin C (ASCORBIC ACID) 500 MG tablet Take 500 mg by mouth daily.     No current facility-administered medications for this visit.     Past Medical History:  Diagnosis Date   Allergy    Arthritis    Bradycardia    pacemaker placed in April 2018  Medtronic   GERD (gastroesophageal reflux disease)    Hyperlipidemia    Hypertension    Seasonal allergies     ROS:   All systems reviewed and negative except as noted in the HPI.   Past Surgical History:  Procedure Laterality Date   ABDOMINAL HYSTERECTOMY  1995   COLONOSCOPY  2013   brodie   FOOT SURGERY  1985   bilateral for flat feet with pain   PACEMAKER IMPLANT N/A 10/29/2016   Procedure: Pacemaker Implant;  Surgeon: Tammie Fall, MD;  Location: [facility] INVASIVE CV LAB;  Service: Cardiovascular;  Laterality: N/A;   POLYPECTOMY     TONSILLECTOMY  as child   TOTAL SHOULDER ARTHROPLASTY Right 12/30/2012   Procedure: RIGHT TOTAL SHOULDER ARTHROPLASTY;  Surgeon: Derald Flattery, MD;  Location: [facility];  Service: Orthopedics;  Laterality: Right;  interscaline block   WISDOM TOOTH EXTRACTION       Family History  Problem Relation Age of Onset   Dementia Mother    Colon cancer Maternal Aunt 61   Colon cancer Cousin 50       maternal   Rectal cancer Neg Hx    Stomach cancer Neg Hx    Colon polyps Neg Hx    Esophageal cancer Neg Hx      Social History   Socioeconomic History   Marital status: Married    Spouse name: Not on file   Number of children: Not on file   Years of education: Not on file   Highest education level: Not on file  Occupational History   Not on file  Tobacco Use   Smoking status: Every Day    Current packs/day: 0.25    Types: Cigarettes   Smokeless tobacco: Never  Vaping Use   Vaping status: Never Used  Substance and Sexual Activity   Alcohol use: No   Drug use: No   Sexual activity: Not Currently  Other Topics Concern   Not on file  Social History  Narrative   Not on file   Social Drivers of Health   Financial Resource Strain: Not on file  Food Insecurity: Not on file  Transportation Needs: Not on file  Physical Activity: Not on file  Stress: Not on file  Social Connections: Not on file  Intimate Partner Violence: Not on file     BP (!) 124/56   Pulse 76   Ht 5' 4.5" (1.638 m)   Wt 125 lb 3.2 oz (56.8 kg)   SpO2 98%   BMI 21.16 kg/m   Physical Exam:  Well appearing NAD HEENT: Unremarkable Neck:  No JVD, no thyromegally Lymphatics:  No adenopathy Back:  No CVA tenderness Lungs:  Clear with no wheezes HEART:  Regular rate rhythm, no murmurs, no rubs, no clicks Abd:  soft, positive bowel sounds, no organomegally, no rebound, no guarding Ext:  2 plus pulses, no edema, no cyanosis, no clubbing Skin:  No rashes no nodules Neuro:  CN II through XII intact, motor grossly intact  EKG - NSR with P synch ventricular pacing  DEVICE  Normal device function.  See PaceArt for details.   Assess/Plan: 1. CHB - she is s/p PPM insertion and doing well.  2. PPM - her [employer] Jude DDD PM is working normally 3. HTN - she is encouraged to reduce her salt intake. I do not want her pressure too low. 4. Tobacco abuse - I gave her a prescription for Chantix .   Pete Brand Phaedra Colgate,MD

## 2023-11-13 NOTE — Patient Instructions (Addendum)
 Medication Instructions:  Your physician has recommended you make the following change in your medication:  Start chantix  - Take 1 tablet (0.5 mg total) by mouth daily for 3 days, THEN 1 tablet (0.5 mg total) 2 (two) times daily for 4 days, THEN 2 tablets (1 mg total) 2 (two) times daily for 23 days.  Lab Work: None ordered.  You may go to any [facility] Location for your lab work:  [facility] - [address] ([facility]) - [phone] [address]  [place] - [address]   [place]  - [address]   [place] - [address] - [address] [place]  - [address] ([facility]   If you have labs (blood work) drawn today and your tests are completely normal, you will receive your results only by: [employer] (if you have MyChart)  If you have any lab test that is abnormal or we need to change your treatment, we will call you or send a MyChart message to review the results.  Testing/Procedures: None ordered.  Follow-Up: At [facility], you and your health needs are our priority.  As part of our continuing mission to provide you with exceptional heart care, we have created designated Provider Care Teams.  These Care Teams include your primary Cardiologist (physician) and Advanced Practice Providers (APPs -  Physician Assistants and Nurse Practitioners) who all work together to provide you with the care you need, when you need it.   Your next appointment:   As needed  The format for your next appointment:   In Person  Provider:   Danelle Birmingham, Denise Hodges{or one of the following Advanced Practice Providers on your designated Care Team:   Charlies Arthur, [place] Ozell Jodie Passey, [place] Leotis Barrack, NP  Note: Remote monitoring is used to monitor your Pacemaker/ ICD from home. This monitoring reduces the number of office visits  required to check your device to one time per year. It allows us  to keep an eye on the functioning of your device to ensure it is working properly.            Valet parking services will be available as well.

## 2023-11-17 DIAGNOSIS — M069 Rheumatoid arthritis, unspecified: Secondary | ICD-10-CM | POA: Diagnosis not present

## 2023-11-17 DIAGNOSIS — I130 Hypertensive heart and chronic kidney disease with heart failure and stage 1 through stage 4 chronic kidney disease, or unspecified chronic kidney disease: Secondary | ICD-10-CM | POA: Diagnosis not present

## 2023-11-17 DIAGNOSIS — M1990 Unspecified osteoarthritis, unspecified site: Secondary | ICD-10-CM | POA: Diagnosis not present

## 2023-11-17 DIAGNOSIS — I509 Heart failure, unspecified: Secondary | ICD-10-CM | POA: Diagnosis not present

## 2023-11-17 DIAGNOSIS — D84821 Immunodeficiency due to drugs: Secondary | ICD-10-CM | POA: Diagnosis not present

## 2023-11-17 DIAGNOSIS — E785 Hyperlipidemia, unspecified: Secondary | ICD-10-CM | POA: Diagnosis not present

## 2023-11-17 DIAGNOSIS — M4800 Spinal stenosis, site unspecified: Secondary | ICD-10-CM | POA: Diagnosis not present

## 2023-11-17 DIAGNOSIS — I2510 Atherosclerotic heart disease of native coronary artery without angina pectoris: Secondary | ICD-10-CM | POA: Diagnosis not present

## 2023-11-17 DIAGNOSIS — I700 Atherosclerosis of aorta: Secondary | ICD-10-CM | POA: Diagnosis not present

## 2023-11-23 DIAGNOSIS — M059 Rheumatoid arthritis with rheumatoid factor, unspecified: Secondary | ICD-10-CM | POA: Diagnosis not present

## 2023-11-23 DIAGNOSIS — I10 Essential (primary) hypertension: Secondary | ICD-10-CM | POA: Diagnosis not present

## 2023-11-23 DIAGNOSIS — Z950 Presence of cardiac pacemaker: Secondary | ICD-10-CM | POA: Diagnosis not present

## 2023-11-23 DIAGNOSIS — K219 Gastro-esophageal reflux disease without esophagitis: Secondary | ICD-10-CM | POA: Diagnosis not present

## 2023-11-23 DIAGNOSIS — K5901 Slow transit constipation: Secondary | ICD-10-CM | POA: Diagnosis not present

## 2023-11-23 DIAGNOSIS — E782 Mixed hyperlipidemia: Secondary | ICD-10-CM | POA: Diagnosis not present

## 2023-11-23 DIAGNOSIS — M549 Dorsalgia, unspecified: Secondary | ICD-10-CM | POA: Diagnosis not present

## 2023-11-23 DIAGNOSIS — Z23 Encounter for immunization: Secondary | ICD-10-CM | POA: Diagnosis not present

## 2023-11-23 DIAGNOSIS — D84821 Immunodeficiency due to drugs: Secondary | ICD-10-CM | POA: Diagnosis not present

## 2023-12-04 NOTE — Progress Notes (Signed)
 Remote pacemaker transmission.

## 2023-12-07 ENCOUNTER — Ambulatory Visit: Admitting: Orthopedic Surgery

## 2023-12-07 ENCOUNTER — Other Ambulatory Visit (INDEPENDENT_AMBULATORY_CARE_PROVIDER_SITE_OTHER): Payer: Self-pay

## 2023-12-07 VITALS — BP 136/75 | HR 78 | Ht 64.5 in | Wt 125.5 lb

## 2023-12-07 DIAGNOSIS — M545 Low back pain, unspecified: Secondary | ICD-10-CM

## 2023-12-07 DIAGNOSIS — Z720 Tobacco use: Secondary | ICD-10-CM | POA: Diagnosis not present

## 2023-12-07 MED ORDER — METHOCARBAMOL 500 MG PO TABS: 500.0000 mg | ORAL_TABLET | Freq: Three times a day (TID) | ORAL | 0 refills | Status: DC | PRN

## 2023-12-07 NOTE — Progress Notes (Signed)
 Orthopedic Spine Surgery Office Note  Assessment: Patient is a 78 y.o. female with left-sided low back pain that radiates into her left posterior and lateral thigh. Has DDD and foraminal stenosis on the left at L5/S1   Plan: -Explained that initially conservative treatment is tried as a significant number of patients may experience relief with these treatment modalities. Discussed that the conservative treatments include:  -activity modification  -physical therapy  -over the counter pain medications  -medrol  dosepak  -lumbar steroid injections -Patient has tried tylenol , gabapentin , steroid injections -Prescribed robaxin for additional pain relief -Would need to be nicotine free prior to any elective spine surgery -Patient should return to office in 6 weeks, x-rays at next visit: none   Explained that nicotine use causes significant and multiple health problems. I advised that she quit using nicotine for her overall health but it would also help in the event that surgery is ever discussed as a treatment option. Nicotine increases the risk of infection, wound healing issues, and pseudarthrosis. Patient expressed understanding and will work on quitting.   ___________________________________________________________________________   History:  Patient is a 78 y.o. female who presents today for lumbar spine. Patient has had low back pain that radiates into her left hip. She feels the pain in the left lower side of her back. It goes into her left buttock and left lateral hip. It does not radiate past the hip. There is not pain in the right lower extremity. There was no trauma or injury that preceded the onset of pain. She has had the pain since January of 2025. It has gotten progressively worse with time.    Weakness: yes, has felt weakness in her low back Symptoms of imbalance: denies Paresthesias and numbness: denies Bowel or bladder incontinence: denies Saddle anesthesia:  denies  Treatments tried: tylenol , gabapentin , steroid injections  Review of systems: Denies fevers and chills, night sweats, unexplained weight loss, history of cancer, has not had pain that wakes her at night  Past medical history: HLD HTN Anxiety GERD IBS RA Vertigo Chronic pain   Allergies: macrobid  Past surgical history:  Hysterectomy Right shoulder replacement Pacemaker placement Tonsillectomy  Social history: Reports use of nicotine product (smoking, vaping, patches, smokeless) Alcohol use: denies Denies recreational drug use   Physical Exam:  BMI of 21.2  General: no acute distress, appears stated age Neurologic: alert, answering questions appropriately, following commands Respiratory: unlabored breathing on room air, symmetric chest rise Psychiatric: appropriate affect, normal cadence to speech   MSK (spine):  -Strength exam      Left  Right EHL    5/5  5/5 TA    5/5  5/5 GSC    5/5  5/5 Knee extension  5/5  5/5 Hip flexion   5/5  5/5  -Sensory exam    Sensation intact to light touch in L3-S1 nerve distributions of bilateral lower extremities  -Achilles DTR: 1/4 on the left, 1/4 on the right -Patellar tendon DTR: 1/4 on the left, 1/4 on the right  -Straight leg raise: negative bilaterally -Femoral nerve stretch test: negative bilaterally -Clonus: no beats bilaterally  -Left hip exam: no pain through range of motion, negative FABER, negative stinchfield -Right hip exam: no pain through range of motion, negative FABER, negative stinchfield  Imaging: XRs of the lumbar spine from 12/07/2023 were independently reviewed and interpreted, showing disc height loss at L5/S1. Vacuum disc phenomenon at L5/S1. No evidence of instability on flexion/extension views. No fracture or dislocation seen.   CT  myelogram of the lumbar spine from 09/25/2023 was independently reviewed and interpreted, showing facet arthropathy at L3/4, L4/5, and L5/S1. Foraminal  stenosis on the left at L5/S1. No other significant stenosis seen.    Patient name: Denise Hodges Patient MRN: [id number] Date of visit: 12/07/23

## 2023-12-09 ENCOUNTER — Telehealth: Payer: Self-pay | Admitting: Orthopedic Surgery

## 2023-12-09 MED ORDER — METHOCARBAMOL 500 MG PO TABS: 500.0000 mg | ORAL_TABLET | Freq: Three times a day (TID) | ORAL | 0 refills | Status: DC | PRN

## 2023-12-09 NOTE — Addendum Note (Signed)
 Addended by: Colette Davies on: 12/09/2023 02:21 PM   Modules accepted: Orders

## 2023-12-09 NOTE — Telephone Encounter (Signed)
I called and advised her of Dr. Georgia Duff message

## 2023-12-09 NOTE — Telephone Encounter (Signed)
 Patient called regarding Flexeril  she was prescribed on Monday 12/07/2023. She says that she has taken one dose once a day, but it is upsetting her stomach and disrupting her appetite. She is nervous about how the medication is making her feel. Please advise. CB [phone]

## 2023-12-10 ENCOUNTER — Telehealth: Payer: Self-pay | Admitting: Orthopedic Surgery

## 2023-12-10 MED ORDER — TIZANIDINE HCL 4 MG PO TABS: 4.0000 mg | ORAL_TABLET | Freq: Three times a day (TID) | ORAL | 0 refills | Status: DC | PRN

## 2023-12-10 NOTE — Telephone Encounter (Signed)
 I called and advised her to this was sent in for her.

## 2023-12-10 NOTE — Telephone Encounter (Signed)
 Patient called and said the [facility] pharmacy don't have the new medication that you sent. She can't take Robaxin the muscle relaxer because it upset her stomach and she lose her appetite. CB#[phone]

## 2023-12-10 NOTE — Addendum Note (Signed)
 Addended by: Colette Davies on: 12/10/2023 11:40 AM   Modules accepted: Orders

## 2023-12-15 ENCOUNTER — Telehealth: Payer: Self-pay | Admitting: Radiology

## 2023-12-15 DIAGNOSIS — M545 Low back pain, unspecified: Secondary | ICD-10-CM

## 2023-12-15 NOTE — Telephone Encounter (Signed)
 I called and advised her of Dr. Frieda Jew message. She was fine with this, and she will take her tylenol  for her pain till she can see them.

## 2023-12-15 NOTE — Addendum Note (Signed)
 Addended by: Colette Davies on: 12/15/2023 04:50 PM   Modules accepted: Orders

## 2023-12-15 NOTE — Telephone Encounter (Signed)
 Patient called stating she has tried tizanidine  and has the worst headache, her stomach is upset, and she is unable to eat. She states that this is the second muscle relaxer that she has tried and is unable to take. She would like to know if there is anything else that you can call in to help her with these muscle spasms?  She is not able to tolerated the methocarbamol  or tizanidine .   Please advise. CB for pt:  [phone]

## 2023-12-24 ENCOUNTER — Ambulatory Visit (HOSPITAL_COMMUNITY)
Admission: EM | Admit: 2023-12-24 | Discharge: 2023-12-24 | Disposition: A | Discharge status: Home / Self Care | Attending: Emergency Medicine | Admitting: Emergency Medicine

## 2023-12-24 ENCOUNTER — Encounter (HOSPITAL_COMMUNITY): Payer: Self-pay

## 2023-12-24 DIAGNOSIS — R051 Acute cough: Secondary | ICD-10-CM | POA: Diagnosis not present

## 2023-12-24 DIAGNOSIS — J019 Acute sinusitis, unspecified: Secondary | ICD-10-CM

## 2023-12-24 DIAGNOSIS — B9689 Other specified bacterial agents as the cause of diseases classified elsewhere: Secondary | ICD-10-CM

## 2023-12-24 DIAGNOSIS — H6121 Impacted cerumen, right ear: Secondary | ICD-10-CM

## 2023-12-24 MED ORDER — CEFDINIR 300 MG PO CAPS: 300.0000 mg | ORAL_CAPSULE | Freq: Two times a day (BID) | ORAL | 0 refills | Status: AC

## 2023-12-24 MED ORDER — AZELASTINE HCL 0.1 % NA SOLN: 2.0000 | Freq: Two times a day (BID) | NASAL | 0 refills | Status: DC

## 2023-12-24 MED ORDER — BENZONATATE 100 MG PO CAPS: 100.0000 mg | ORAL_CAPSULE | Freq: Three times a day (TID) | ORAL | 0 refills | Status: DC

## 2023-12-24 NOTE — Discharge Instructions (Signed)
 Start taking cefdinir  twice daily for 7 days for sinus infection. Use azelastine nasal spray twice daily to help with congestion. Take Tessalon  every 8 hours as needed for cough. You can also take over-the-counter Mucinex  (guaifenesin ) to help with your symptoms. Follow-up with primary care provider or return here if needed.

## 2023-12-24 NOTE — ED Triage Notes (Signed)
 Chief Complaint: post nasal drip, right ear pain, congestion, sore throat, and sinus pressure. States this happens to her every year.   Sick exposure: No  Onset: this past monday  Prescriptions or OTC medications tried: No    New foods, medications, or products: No  Recent Travel: No

## 2023-12-24 NOTE — ED Provider Notes (Signed)
 [facility]    CSN: [id number] Arrival date & time: 12/24/23  1623      History   Chief Complaint Chief Complaint  Patient presents with   Sinus Problem   Nasal Congestion    HPI Denise Hodges is a 78 y.o. female.   Patient presents with congestion, right ear pain, sinus pressure/pain, and postnasal drip that is causing her to cough that began on 5/26.  Patient states that she has symptoms like this once every year.  Patient states in the past she received amoxicillin  without relief and reports taking cefdinir  with relief of symptoms.  Denies fever, body aches, chills, shortness of breath, chest pain, nausea, vomiting, diarrhea, and abdominal pain.  Patient reports that she has been taking cetirizine without relief.  The history is provided by the patient and medical records.  Sinus Problem    Past Medical History:  Diagnosis Date   Allergy    Arthritis    Bradycardia    pacemaker placed in April 2018  Medtronic   GERD (gastroesophageal reflux disease)    Hyperlipidemia    Hypertension    Seasonal allergies     Patient Active Problem List   Diagnosis Date Noted   Pacemaker 02/10/2019   Heart block, AV 10/29/2016   Mobitz type 2 second degree atrioventricular block 10/29/2016   Arthritis of shoulder 12/31/2012   HTN (hypertension) 10/22/2012   Pure hypercholesterolemia 10/22/2012    Past Surgical History:  Procedure Laterality Date   ABDOMINAL HYSTERECTOMY  1995   COLONOSCOPY  2013   brodie   FOOT SURGERY  1985   bilateral for flat feet with pain   PACEMAKER IMPLANT N/A 10/29/2016   Procedure: Pacemaker Implant;  Surgeon: Tammie Fall, MD;  Location: [facility];  Service: Cardiovascular;  Laterality: N/A;   POLYPECTOMY     TONSILLECTOMY  as child   TOTAL SHOULDER ARTHROPLASTY Right 12/30/2012   Procedure: RIGHT TOTAL SHOULDER ARTHROPLASTY;  Surgeon: Derald Flattery, MD;  Location: [facility];  Service: Orthopedics;  Laterality:  Right;  interscaline block   WISDOM TOOTH EXTRACTION      OB History   No obstetric history on file.      Home Medications    Prior to Admission medications   Medication Sig Start Date End Date Taking? Authorizing Provider  azelastine (ASTELIN) 0.1 % nasal spray Place 2 sprays into both nostrils 2 (two) times daily. Use in each nostril as directed 12/24/23  Yes Levora Reas A, NP  benzonatate  (TESSALON ) 100 MG capsule Take 1 capsule (100 mg total) by mouth every 8 (eight) hours. 12/24/23  Yes Levora Reas A, NP  Calcium Carbonate-Vitamin D (CALCIUM 600 + D PO) Take 1 tablet by mouth daily.   Yes [provider]  carvedilol  (COREG ) 12.5 MG tablet Take 12.5 mg by mouth 2 (two) times daily. 10/22/23  Yes [provider]  cefdinir  (OMNICEF ) 300 MG capsule Take 1 capsule (300 mg total) by mouth 2 (two) times daily for 7 days. 12/24/23 12/31/23 Yes Christopherjame Carnell A, NP  cetirizine (ZYRTEC) 10 MG tablet Take 10 mg by mouth daily.   Yes [provider]  cholecalciferol (VITAMIN D-400) 400 UNITS TABS Take 400 Units by mouth daily.   Yes [provider]  fenofibrate  160 MG tablet Take 1 tablet by mouth daily. 02/06/19  Yes [provider]  irbesartan  (AVAPRO ) 300 MG tablet Take 300 mg by mouth daily. 10/07/23  Yes [provider]  methotrexate 2.5 MG tablet Take 15 mg by mouth once a week. 08/14/21  Yes [provider]  mineral oil-hydrophilic petrolatum  (AQUAPHOR) ointment Apply topically as needed for dry skin. 05/29/22  Yes Starlene Eaton, FNP  pantoprazole  (PROTONIX ) 40 MG tablet Take by mouth.   Yes [provider]  verapamil (CALAN-SR) 120 MG CR tablet Take 120 mg by mouth daily. 10/22/23  Yes [provider]  vitamin C (ASCORBIC ACID) 500 MG tablet Take 500 mg by mouth daily.   Yes [provider]  omeprazole (PRILOSEC) 20 MG capsule Take 20 mg by mouth daily.  08/26/20  [provider]     Family History Family History  Problem Relation Age of Onset   Dementia Mother    Colon cancer Maternal Aunt 56   Colon cancer Cousin 50       maternal   Rectal cancer Neg Hx    Stomach cancer Neg Hx    Colon polyps Neg Hx    Esophageal cancer Neg Hx     Social History Social History   Tobacco Use   Smoking status: Every Day    Current packs/day: 0.25    Types: Cigarettes   Smokeless tobacco: Never  Vaping Use   Vaping status: Never Used  Substance Use Topics   Alcohol use: No   Drug use: No     Allergies   Macrobid [nitrofurantoin macrocrystal]   Review of Systems Review of Systems  Per HPI  Physical Exam Triage Vital Signs ED Triage Vitals  Encounter Vitals Group     BP 12/24/23 1657 126/74     Systolic BP Percentile --      Diastolic BP Percentile --      Pulse Rate 12/24/23 1657 64     Resp 12/24/23 1657 18     Temp 12/24/23 1657 98 F (36.7 C)     Temp Source 12/24/23 1657 Oral     SpO2 12/24/23 1657 98 %     Weight --      Height --      Head Circumference --      Peak Flow --      Pain Score 12/24/23 1654 10     Pain Loc --      Pain Education --      Exclude from Growth Chart --    No data found.  Updated Vital Signs BP 126/74 (BP Location: Right Arm)   Pulse 64   Temp 98 F (36.7 C) (Oral)   Resp 18   SpO2 98%   Visual Acuity Right Eye Distance:   Left Eye Distance:   Bilateral Distance:    Right Eye Near:   Left Eye Near:    Bilateral Near:     Physical Exam Vitals and nursing note reviewed.  Constitutional:      General: She is awake. She is not in acute distress.    Appearance: Normal appearance. She is well-developed and well-groomed. She is not ill-appearing.  HENT:     Right Ear: There is impacted cerumen.     Left Ear: Tympanic membrane, ear canal and external ear normal.     Nose: Congestion and rhinorrhea present.     Mouth/Throat:     Mouth: Mucous membranes are moist.     Pharynx: Posterior  oropharyngeal erythema and postnasal drip present. No oropharyngeal exudate.  Cardiovascular:     Rate and Rhythm: Normal rate and regular rhythm.  Pulmonary:     Effort:  Pulmonary effort is normal.     Breath sounds: Normal breath sounds.  Skin:    General: Skin is warm and dry.  Neurological:     Mental Status: She is alert.  Psychiatric:        Behavior: Behavior is cooperative.      UC Treatments / Results  Labs (all labs ordered are listed, but only abnormal results are displayed) Labs Reviewed - No data to display  EKG   Radiology No results found.  Procedures Procedures (including critical care time)  Medications Ordered in UC Medications - No data to display  Initial Impression / Assessment and Plan / UC Course  I have reviewed the triage vital signs and the nursing notes.  Pertinent labs & imaging results that were available during my care of the patient were reviewed by me and considered in my medical decision making (see chart for details).     Patient is well-appearing.  Vitals are stable.  Upon assessment congestion and rhinorrhea are present, erythema and PND noted to pharynx.  Right ear with impacted cerumen.  Upon assessment after ear lavage TM is intact and there is no obvious signs of infection.  Prescribed cefdinir  for sinusitis.  Prescribed azelastine  nasal spray to help with congestion.  Prescribed Tessalon  for cough.  Discussed follow-up and return precautions. Final Clinical Impressions(s) / UC Diagnoses   Final diagnoses:  Acute cough  Impacted cerumen of right ear  Acute bacterial sinusitis     Discharge Instructions      Start taking cefdinir  twice daily for 7 days for sinus infection. Use azelastine  nasal spray twice daily to help with congestion. Take Tessalon  every 8 hours as needed for cough. You can also take over-the-counter Mucinex  (guaifenesin ) to help with your symptoms. Follow-up with primary care provider or return here  if needed.  ED Prescriptions     Medication Sig Dispense Auth. Provider   cefdinir  (OMNICEF ) 300 MG capsule Take 1 capsule (300 mg total) by mouth 2 (two) times daily for 7 days. 14 capsule Rosevelt Constable, Armanii Urbanik A, NP   azelastine  (ASTELIN ) 0.1 % nasal spray Place 2 sprays into both nostrils 2 (two) times daily. Use in each nostril as directed 30 mL Levora Reas A, NP   benzonatate  (TESSALON ) 100 MG capsule Take 1 capsule (100 mg total) by mouth every 8 (eight) hours. 21 capsule Levora Reas A, NP      PDMP not reviewed this encounter.   Levora Reas A, NP 12/24/23 [phone]

## 2023-12-31 DIAGNOSIS — I10 Essential (primary) hypertension: Secondary | ICD-10-CM | POA: Diagnosis not present

## 2024-01-07 DIAGNOSIS — Z1331 Encounter for screening for depression: Secondary | ICD-10-CM | POA: Diagnosis not present

## 2024-01-07 DIAGNOSIS — I10 Essential (primary) hypertension: Secondary | ICD-10-CM | POA: Diagnosis not present

## 2024-01-07 DIAGNOSIS — F17200 Nicotine dependence, unspecified, uncomplicated: Secondary | ICD-10-CM | POA: Diagnosis not present

## 2024-01-07 DIAGNOSIS — G4700 Insomnia, unspecified: Secondary | ICD-10-CM | POA: Diagnosis not present

## 2024-01-07 DIAGNOSIS — Z950 Presence of cardiac pacemaker: Secondary | ICD-10-CM | POA: Diagnosis not present

## 2024-01-07 DIAGNOSIS — Z0000 Encounter for general adult medical examination without abnormal findings: Secondary | ICD-10-CM | POA: Diagnosis not present

## 2024-01-07 DIAGNOSIS — E782 Mixed hyperlipidemia: Secondary | ICD-10-CM | POA: Diagnosis not present

## 2024-01-07 DIAGNOSIS — I441 Atrioventricular block, second degree: Secondary | ICD-10-CM | POA: Diagnosis not present

## 2024-01-18 ENCOUNTER — Ambulatory Visit: Admitting: Orthopedic Surgery

## 2024-01-19 ENCOUNTER — Ambulatory Visit: Payer: Medicare PPO

## 2024-01-19 DIAGNOSIS — I4430 Unspecified atrioventricular block: Secondary | ICD-10-CM

## 2024-01-19 LAB — CUP PACEART REMOTE DEVICE CHECK
Battery Remaining Longevity: 16 mo
Battery Remaining Percentage: 16 %
Battery Voltage: 2.87 V
Brady Statistic AP VP Percent: 3.6 %
Brady Statistic AP VS Percent: 1 %
Brady Statistic AS VP Percent: 92 %
Brady Statistic AS VS Percent: 1.9 %
Brady Statistic RA Percent Paced: 2 %
Brady Statistic RV Percent Paced: 96 %
Date Time Interrogation Session: 20250624020014
Implantable Lead Connection Status: 753985
Implantable Lead Connection Status: 753985
Implantable Lead Implant Date: 20180404
Implantable Lead Implant Date: 20180404
Implantable Lead Location: 753859
Implantable Lead Location: 753860
Implantable Lead Model: 3830
Implantable Pulse Generator Implant Date: 20180404
Lead Channel Impedance Value: 560 Ohm
Lead Channel Impedance Value: 580 Ohm
Lead Channel Pacing Threshold Amplitude: 0.5 V
Lead Channel Pacing Threshold Amplitude: 0.5 V
Lead Channel Pacing Threshold Pulse Width: 0.4 ms
Lead Channel Pacing Threshold Pulse Width: 1 ms
Lead Channel Sensing Intrinsic Amplitude: 12 mV
Lead Channel Sensing Intrinsic Amplitude: 3.8 mV
Lead Channel Setting Pacing Amplitude: 2 V
Lead Channel Setting Pacing Amplitude: 2 V
Lead Channel Setting Pacing Pulse Width: 1 ms
Lead Channel Setting Sensing Sensitivity: 2 mV
Pulse Gen Model: 2272
Pulse Gen Serial Number: 8002284

## 2024-01-24 ENCOUNTER — Ambulatory Visit: Payer: Self-pay | Admitting: Internal Medicine

## 2024-01-31 DIAGNOSIS — I10 Essential (primary) hypertension: Secondary | ICD-10-CM | POA: Diagnosis not present

## 2024-02-08 ENCOUNTER — Other Ambulatory Visit: Payer: Self-pay | Admitting: Gastroenterology

## 2024-02-08 ENCOUNTER — Ambulatory Visit: Admitting: Orthopedic Surgery

## 2024-02-08 DIAGNOSIS — R195 Other fecal abnormalities: Secondary | ICD-10-CM | POA: Diagnosis not present

## 2024-02-08 DIAGNOSIS — D649 Anemia, unspecified: Secondary | ICD-10-CM | POA: Diagnosis not present

## 2024-02-08 DIAGNOSIS — K219 Gastro-esophageal reflux disease without esophagitis: Secondary | ICD-10-CM | POA: Diagnosis not present

## 2024-02-08 DIAGNOSIS — R1013 Epigastric pain: Secondary | ICD-10-CM

## 2024-02-08 DIAGNOSIS — M5416 Radiculopathy, lumbar region: Secondary | ICD-10-CM

## 2024-02-08 DIAGNOSIS — K5909 Other constipation: Secondary | ICD-10-CM | POA: Diagnosis not present

## 2024-02-08 NOTE — Progress Notes (Addendum)
 Orthopedic Spine Surgery Office Note   Assessment: Patient is a 78 y.o. female with left-sided low back pain that radiates into her left posterior and lateral thigh. Has DDD and foraminal stenosis on the left at L5/S1     Plan: -Patient has tried tylenol , gabapentin , robaxin , steroid injections -Since she is doing well and does not want to try any treatments, did not recommend any new treatments -If she does develop pain like before, would talk about a L5 transforaminal injection for her -Would need to be nicotine free prior to any elective spine surgery -Patient should return to office on an as-needed basis   ___________________________________________________________________________     History:   Patient is a 78 y.o. female who presents today for follow up on her lumbar spine.  Patient feels that she is doing better since she was last seen in the office.  She still has some pain in her low back radiating into her left buttock and lateral hip.  It still does not radiate past the hip.  She has no pain rating into the right lower extremity.  Her pain is currently tolerable.  She is not interested in trying any new treatments at this point. Her bigger issue right now is reflux and abdominal pain for which she is undergoing further work up with another provider.    Treatments tried: tylenol , gabapentin , robaxin , steroid injections    Physical Exam:   General: no acute distress, appears stated age Neurologic: alert, answering questions appropriately, following commands Respiratory: unlabored breathing on room air, symmetric chest rise Psychiatric: appropriate affect, normal cadence to speech     MSK (spine):   -Strength exam                                                   Left                  Right EHL                              5/5                  5/5 TA                                 5/5                  5/5 GSC                             5/5                  5/5 Knee  extension            5/5                  5/5 Hip flexion                    5/5                  5/5   -Sensory exam  Sensation intact to light touch in L3-S1 nerve distributions of bilateral lower extremities   Imaging: XRs of the lumbar spine from 12/07/2023 were previously independently reviewed and interpreted, showing disc height loss at L5/S1. Vacuum disc phenomenon at L5/S1. No evidence of instability on flexion/extension views. No fracture or dislocation seen.    CT myelogram of the lumbar spine from 09/25/2023 was previously independently reviewed and interpreted, showing facet arthropathy at L3/4, L4/5, and L5/S1. Foraminal stenosis on the left at L5/S1. No other significant stenosis seen.      Patient name: Denise Hodges Patient MRN: [id number] Date of visit: 02/08/24

## 2024-02-12 ENCOUNTER — Inpatient Hospital Stay: Admission: RE | Admit: 2024-02-12 | Source: Ambulatory Visit

## 2024-02-16 ENCOUNTER — Other Ambulatory Visit

## 2024-02-19 ENCOUNTER — Ambulatory Visit
Admission: RE | Admit: 2024-02-19 | Discharge: 2024-02-19 | Disposition: A | Discharge status: Home / Self Care | Source: Ambulatory Visit | Attending: Gastroenterology | Admitting: Gastroenterology

## 2024-02-19 DIAGNOSIS — D649 Anemia, unspecified: Secondary | ICD-10-CM

## 2024-02-19 DIAGNOSIS — K6389 Other specified diseases of intestine: Secondary | ICD-10-CM | POA: Diagnosis not present

## 2024-02-19 DIAGNOSIS — M87052 Idiopathic aseptic necrosis of left femur: Secondary | ICD-10-CM | POA: Diagnosis not present

## 2024-02-19 DIAGNOSIS — R1013 Epigastric pain: Secondary | ICD-10-CM

## 2024-02-19 DIAGNOSIS — M87051 Idiopathic aseptic necrosis of right femur: Secondary | ICD-10-CM | POA: Diagnosis not present

## 2024-02-19 DIAGNOSIS — R195 Other fecal abnormalities: Secondary | ICD-10-CM

## 2024-02-19 MED ORDER — IOPAMIDOL (ISOVUE-300) INJECTION 61%
100.0000 mL | Freq: Once | INTRAVENOUS | Status: AC | PRN
  Administered 2024-02-19: 100 mL via INTRAVENOUS

## 2024-02-25 DIAGNOSIS — I10 Essential (primary) hypertension: Secondary | ICD-10-CM | POA: Diagnosis not present

## 2024-02-25 DIAGNOSIS — M059 Rheumatoid arthritis with rheumatoid factor, unspecified: Secondary | ICD-10-CM | POA: Diagnosis not present

## 2024-02-25 DIAGNOSIS — E782 Mixed hyperlipidemia: Secondary | ICD-10-CM | POA: Diagnosis not present

## 2024-03-01 DIAGNOSIS — I10 Essential (primary) hypertension: Secondary | ICD-10-CM | POA: Diagnosis not present

## 2024-03-21 DIAGNOSIS — M1712 Unilateral primary osteoarthritis, left knee: Secondary | ICD-10-CM | POA: Diagnosis not present

## 2024-03-27 DIAGNOSIS — M059 Rheumatoid arthritis with rheumatoid factor, unspecified: Secondary | ICD-10-CM | POA: Diagnosis not present

## 2024-03-27 DIAGNOSIS — I10 Essential (primary) hypertension: Secondary | ICD-10-CM | POA: Diagnosis not present

## 2024-03-27 DIAGNOSIS — E782 Mixed hyperlipidemia: Secondary | ICD-10-CM | POA: Diagnosis not present

## 2024-03-31 DIAGNOSIS — I10 Essential (primary) hypertension: Secondary | ICD-10-CM | POA: Diagnosis not present

## 2024-04-06 ENCOUNTER — Ambulatory Visit (HOSPITAL_COMMUNITY)
Admission: EM | Admit: 2024-04-06 | Discharge: 2024-04-06 | Disposition: A | Discharge status: Home / Self Care | Attending: Emergency Medicine | Admitting: Emergency Medicine

## 2024-04-06 ENCOUNTER — Encounter (HOSPITAL_COMMUNITY): Payer: Self-pay

## 2024-04-06 DIAGNOSIS — J302 Other seasonal allergic rhinitis: Secondary | ICD-10-CM

## 2024-04-06 DIAGNOSIS — R6889 Other general symptoms and signs: Secondary | ICD-10-CM

## 2024-04-06 DIAGNOSIS — J209 Acute bronchitis, unspecified: Secondary | ICD-10-CM

## 2024-04-06 LAB — POC SARS CORONAVIRUS 2 AG -  ED: SARS Coronavirus 2 Ag: NEGATIVE

## 2024-04-06 MED ORDER — ALBUTEROL SULFATE HFA 108 (90 BASE) MCG/ACT IN AERS: 2.0000 | INHALATION_SPRAY | Freq: Four times a day (QID) | RESPIRATORY_TRACT | 2 refills | Status: AC | PRN

## 2024-04-06 MED ORDER — AZITHROMYCIN 250 MG PO TABS: ORAL_TABLET | ORAL | 0 refills | Status: AC

## 2024-04-06 MED ORDER — IPRATROPIUM BROMIDE 0.06 % NA SOLN: 2.0000 | Freq: Three times a day (TID) | NASAL | 1 refills | Status: AC

## 2024-04-06 MED ORDER — IPRATROPIUM-ALBUTEROL 0.5-2.5 (3) MG/3ML IN SOLN
RESPIRATORY_TRACT | Status: AC
  Filled 2024-04-06: qty 3

## 2024-04-06 MED ORDER — BENZONATATE 100 MG PO CAPS: 100.0000 mg | ORAL_CAPSULE | Freq: Three times a day (TID) | ORAL | 0 refills | Status: AC

## 2024-04-06 MED ORDER — IPRATROPIUM-ALBUTEROL 0.5-2.5 (3) MG/3ML IN SOLN
3.0000 mL | Freq: Once | RESPIRATORY_TRACT | Status: AC
  Administered 2024-04-06: 3 mL via RESPIRATORY_TRACT

## 2024-04-06 MED ORDER — LEVOCETIRIZINE DIHYDROCHLORIDE 5 MG PO TABS: 5.0000 mg | ORAL_TABLET | Freq: Every evening | ORAL | 1 refills | Status: AC

## 2024-04-06 NOTE — ED Provider Notes (Signed)
 [facility]    CSN: [id number] Arrival date & time: 04/06/24  1927    HISTORY   Chief Complaint  Patient presents with   Cough   Nasal Congestion   HPI Denise Hodges is a pleasant, 77 y.o. female who presents to urgent care today. Patient complains of a 10-day history of progressively worsening cough intermittently productive of small has a sputum, nasal congestion, rhinorrhea.  Patient states she is a current everyday smoker and endorses a history of allergies, currently taking Zyrtec as needed.  Patient denies history of asthma but states she has had bronchitis in the past.  Denies history of pneumonia.  Patient denies known sick contacts.  Patient states she has also been experiencing fatigue, headache, body aches.  Blood pressure is elevated on arrival today with mildly diminished O2 sats, patient is ill-appearing.  The history is provided by the patient.  Cough  Past Medical History:  Diagnosis Date   Allergy    Arthritis    Bradycardia    pacemaker placed in April 2018  [employer]   GERD (gastroesophageal reflux disease)    Hyperlipidemia    Hypertension    Seasonal allergies    Patient Active Problem List   Diagnosis Date Noted   Pacemaker 02/10/2019   Heart block, AV 10/29/2016   Mobitz type 2 second degree atrioventricular block 10/29/2016   Arthritis of shoulder 12/31/2012   HTN (hypertension) 10/22/2012   Pure hypercholesterolemia 10/22/2012   Past Surgical History:  Procedure Laterality Date   ABDOMINAL HYSTERECTOMY  1995   COLONOSCOPY  2013   brodie   FOOT SURGERY  1985   bilateral for flat feet with pain   PACEMAKER IMPLANT N/A 10/29/2016   Procedure: Pacemaker Implant;  Surgeon: Danelle LELON Birmingham, MD;  Location: [facility];  Service: Cardiovascular;  Laterality: N/A;   POLYPECTOMY     TONSILLECTOMY  as child   TOTAL SHOULDER ARTHROPLASTY Right 12/30/2012   Procedure: RIGHT TOTAL SHOULDER ARTHROPLASTY;  Surgeon: Eva Elsie Herring, MD;  Location: [facility];  Service: Orthopedics;  Laterality: Right;  interscaline block   WISDOM TOOTH EXTRACTION     OB History   No obstetric history on file.    Home Medications    Prior to Admission medications   Medication Sig Start Date End Date Taking? Authorizing Provider  albuterol  (VENTOLIN  HFA) 108 (90 Base) MCG/ACT inhaler Inhale 2 puffs into the lungs every 6 (six) hours as needed for wheezing or shortness of breath (Cough). 04/06/24  Yes Joesph Shaver Scales, PA-C  azithromycin  (ZITHROMAX ) 250 MG tablet Take 2 tablets (500 mg total) by mouth daily for 1 day, THEN 1 tablet (250 mg total) daily for 4 days. 04/06/24 04/11/24 Yes Joesph Shaver Scales, PA-C  Calcium Carbonate-Vitamin D (CALCIUM 600 + D PO) Take 1 tablet by mouth daily.   Yes [provider]  carvedilol  (COREG ) 12.5 MG tablet Take 12.5 mg by mouth 2 (two) times daily. 10/22/23  Yes [provider]  cholecalciferol (VITAMIN D-400) 400 UNITS TABS Take 400 Units by mouth daily.   Yes [provider]  fenofibrate  160 MG tablet Take 1 tablet by mouth daily. 02/06/19  Yes [provider]  ipratropium (ATROVENT ) 0.06 % nasal spray Place 2 sprays into both nostrils 3 (three) times daily. As needed for nasal congestion, runny nose 04/06/24  Yes Joesph Shaver Scales, PA-C  irbesartan  (AVAPRO ) 300 MG tablet Take 300 mg by mouth daily. 10/07/23  Yes [provider]  levocetirizine (XYZAL ) 5 MG tablet Take 1 tablet (5 mg total) by mouth every evening. 04/06/24 10/03/24 Yes Joesph Shaver Scales, PA-C  methotrexate 2.5 MG tablet Take 15 mg by mouth once a week. 08/14/21  Yes [provider]  mineral oil-hydrophilic petrolatum  (AQUAPHOR) ointment Apply topically as needed for dry skin. 05/29/22  Yes Enedelia Dorna HERO, FNP  pantoprazole  (PROTONIX ) 40 MG tablet Take by mouth.   Yes [provider]  verapamil (CALAN-SR) 120 MG CR tablet Take 120 mg by mouth daily.  10/22/23  Yes [provider]  vitamin C (ASCORBIC ACID) 500 MG tablet Take 500 mg by mouth daily.   Yes [provider]  benzonatate  (TESSALON ) 100 MG capsule Take 1 capsule (100 mg total) by mouth every 8 (eight) hours. 04/06/24   Joesph Shaver Scales, PA-C  omeprazole (PRILOSEC) 20 MG capsule Take 20 mg by mouth daily.  08/26/20  [provider]    Family History Family History  Problem Relation Age of Onset   Dementia Mother    Colon cancer Maternal Aunt 58   Colon cancer Cousin 50       maternal   Rectal cancer Neg Hx    Stomach cancer Neg Hx    Colon polyps Neg Hx    Esophageal cancer Neg Hx    Social History Social History   Tobacco Use   Smoking status: Every Day    Current packs/day: 0.25    Types: Cigarettes   Smokeless tobacco: Never  Vaping Use   Vaping status: Never Used  Substance Use Topics   Alcohol use: No   Drug use: No   Allergies   Macrobid [nitrofurantoin macrocrystal]  Review of Systems Review of Systems  Respiratory:  Positive for cough.    Pertinent findings revealed after performing a 14 point review of systems has been noted in the history of present illness.  Physical Exam Vital Signs BP (!) 152/85 (BP Location: Left Arm)   Pulse 86   Temp 98.3 F (36.8 C) (Oral)   Resp 20   SpO2 95%   No data found.  Physical Exam Vitals and nursing note reviewed.  Constitutional:      General: She is awake. She is not in acute distress.    Appearance: Normal appearance. She is well-developed and well-groomed. She is not ill-appearing.  HENT:     Head: Normocephalic and atraumatic.     Salivary Glands: Right salivary gland is not diffusely enlarged or tender. Left salivary gland is not diffusely enlarged or tender.     Right Ear: Hearing, ear canal and external ear normal. A middle ear effusion is present. Tympanic membrane is bulging. Tympanic membrane is not injected or erythematous.     Left Ear: Hearing, ear canal  and external ear normal. A middle ear effusion is present. Tympanic membrane is bulging. Tympanic membrane is not injected or erythematous.     Ears:     Comments: Bilateral EACs normal, both TMs bulging with clear fluid    Nose: Rhinorrhea present. No nasal deformity, septal deviation, signs of injury or nasal tenderness. Rhinorrhea is clear.     Right Nostril: Occlusion present. No foreign body, epistaxis or septal hematoma.     Left Nostril: Occlusion present. No foreign body, epistaxis or septal hematoma.     Right Turbinates: Enlarged and swollen. Not pale.     Left Turbinates: Enlarged and swollen. Not pale.     Right Sinus: No maxillary sinus tenderness or frontal sinus  tenderness.     Left Sinus: No maxillary sinus tenderness or frontal sinus tenderness.     Mouth/Throat:     Lips: Pink. No lesions.     Mouth: Mucous membranes are moist. No oral lesions.     Tongue: No lesions. Tongue does not deviate from midline.     Palate: No mass and lesions.     Pharynx: Oropharynx is clear. Uvula midline. Postnasal drip present. No pharyngeal swelling, oropharyngeal exudate, posterior oropharyngeal erythema or uvula swelling.     Tonsils: No tonsillar exudate. 0 on the right. 0 on the left.     Comments: Postnasal drip Eyes:     General: Lids are normal.        Right eye: No discharge.        Left eye: No discharge.     Conjunctiva/sclera: Conjunctivae normal.     Right eye: Right conjunctiva is not injected.     Left eye: Left conjunctiva is not injected.  Neck:     Trachea: Trachea and phonation normal.  Cardiovascular:     Rate and Rhythm: Normal rate and regular rhythm.  Pulmonary:     Effort: Pulmonary effort is normal.     Breath sounds: Examination of the right-upper field reveals decreased breath sounds. Examination of the left-upper field reveals decreased breath sounds. Examination of the right-middle field reveals decreased breath sounds. Examination of the left-middle field  reveals decreased breath sounds. Examination of the right-lower field reveals decreased breath sounds. Examination of the left-lower field reveals decreased breath sounds. Decreased breath sounds present.     Comments: Repeat auscultation post nebulized bronchodilator revealed improved work of breathing with mild rhonchi appreciated in bilateral lower lung fields Chest:     Chest wall: No tenderness.  Musculoskeletal:        General: Normal range of motion.     Cervical back: Full passive range of motion without pain, normal range of motion and neck supple. Normal range of motion.  Lymphadenopathy:     Cervical: No cervical adenopathy.  Skin:    General: Skin is warm and dry.     Findings: No erythema or rash.  Neurological:     General: No focal deficit present.     Mental Status: She is alert and oriented to person, place, and time. Mental status is at baseline.  Psychiatric:        Attention and Perception: Attention and perception normal.        Mood and Affect: Mood and affect normal.        Speech: Speech normal.        Behavior: Behavior normal. Behavior is cooperative.        Thought Content: Thought content normal.     Visual Acuity Right Eye Distance:   Left Eye Distance:   Bilateral Distance:    Right Eye Near:   Left Eye Near:    Bilateral Near:     UC Couse / Diagnostics / Procedures:     Radiology No results found.  Procedures Procedures (including critical care time) EKG  Pending results:  Labs Reviewed  POC SARS CORONAVIRUS 2 AG -  ED    Medications Ordered in UC: Medications  ipratropium-albuterol  (DUONEB) 0.5-2.5 (3) MG/3ML nebulizer solution 3 mL (3 mLs Nebulization Given 04/06/24 2035)    UC Diagnoses / Final Clinical Impressions(s)   I have reviewed the triage vital signs and the nursing notes.  Pertinent labs & imaging results that were available during my  care of the patient were reviewed by me and considered in my medical decision making  (see chart for details).    Final diagnoses:  Feeling unwell  Acute bronchitis, unspecified organism  Seasonal allergic rhinitis, unspecified trigger   Patient advised of negative COVID-19 test.  Patient further advised recommend repeat testing in 3 days if still feeling unwell.  At this time, believe patient is experiencing uncontrolled seasonal allergic rhinitis which has evolved into acute bronchitis.  Patient did have good response to nebulized bronchodilator so prescription for albuterol  inhaler was provided.  Patient was encouraged to begin Xyzal  and ipratropium nasal spray for relief of upper respiratory symptoms as well as lower respiratory inflammation.  Azithromycin  was prescribed in an effort to prevent pneumonia given patient's age, uncontrolled allergies and current physical exam findings concerning for bronchitis.  Azithromycin  may also provide some anti-inflammatory benefit for her with lower respiratory tract as well.  Conservative care recommended.  Return precautions advised.  Please see discharge instructions below for details of plan of care as provided to patient. ED Prescriptions     Medication Sig Dispense Auth. Provider   benzonatate  (TESSALON ) 100 MG capsule Take 1 capsule (100 mg total) by mouth every 8 (eight) hours. 21 capsule Joesph Shaver Scales, PA-C   albuterol  (VENTOLIN  HFA) 108 (90 Base) MCG/ACT inhaler Inhale 2 puffs into the lungs every 6 (six) hours as needed for wheezing or shortness of breath (Cough). 18 g Joesph Shaver Scales, PA-C   levocetirizine (XYZAL ) 5 MG tablet Take 1 tablet (5 mg total) by mouth every evening. 90 tablet Joesph Shaver Scales, PA-C   ipratropium (ATROVENT ) 0.06 % nasal spray Place 2 sprays into both nostrils 3 (three) times daily. As needed for nasal congestion, runny nose 15 mL Joesph Shaver Scales, PA-C   azithromycin  (ZITHROMAX ) 250 MG tablet Take 2 tablets (500 mg total) by mouth daily for 1 day, THEN 1 tablet (250 mg total)  daily for 4 days. 6 tablet Joesph Shaver Scales, PA-C      PDMP not reviewed this encounter.  Pending results:  Labs Reviewed  POC SARS CORONAVIRUS 2 AG -  ED      Discharge Instructions      Your COVID-19 test today was negative.  No further testing is needed.  Please read below to learn more about the medications, dosages and frequencies that I recommend to help alleviate your symptoms and to get you feeling better soon:   Z-Pak (azithromycin ):  Please take two (2) tablets on day one and one tablet daily thereafter until the prescription is complete.I have prescribed this antibiotic for you for you in an effort to prevent pneumonia from sitting in.  Please be advised that this antibiotic can cause upset stomach, this will resolve once antibiotics are complete.  You are welcome to take a probiotic, eat yogurt, take Imodium while taking this medication.  Please avoid other systemic medications such as Maalox, Pepto-Bismol or milk of magnesia as they can interfere with the body's ability to absorb the antibiotics.   Xyzal  (levocetirizine): This is an excellent second-generation antihistamine that helps to reduce respiratory inflammatory response to environmental allergens.  In some patients, this medication can cause daytime sleepiness so I recommend that you take 1 tablet daily at bedtime.  I have sent a prescription to your pharmacy because your insurance pays for this medication.  Your insurance will not pay for Zyrtec (cetirizine).  Atrovent  (ipratropium): This is an excellent nasal decongestant spray that does not cause rebound  congestion.  Once your nasal congestion is improved, your postnasal drip will stop which will decrease your urge to cough.  Please instill 2 sprays into each nare with each use, you may use this up to 3 times daily.  Once you find that you are forgetting to use the spray more often that you remember to use it, you will know that you no longer need it.    Tessalon  Perles (benzonatate ): Benzonatate  as needed cough medication that works by numbing the throat and the lungs.  This dulls your lungs' cough reflex, making your cough less active.  I have sent a prescription to your pharmacy.  ProAir , Ventolin , Proventil  (albuterol ): This inhaled medication contains a short acting beta agonist bronchodilator.  This medication relaxes the smooth muscle of the airway in the lungs.  When these muscles are tight, breathing becomes more constricted.  The result of relaxation of the smooth muscle is increased air movement and improved work of breathing.  This is a short acting medication that can be used every 4-6 hours as needed for increased work of breathing, shortness of breath, wheezing and excessive coughing.  It comes in the form of a handheld inhaler or nebulizer solution.  I recommended that for the next 3 to 4 days, this medication is used 4 times daily on a scheduled basis then decrease to twice daily and as needed until symptoms have completely resolved which I anticipate will be several weeks.  I have sent a prescription to your pharmacy.  If symptoms have not meaningfully improved in the next 7 to 10 days, please return for repeat evaluation or follow-up with your regular provider.  If symptoms have worsened in the next 3 to 5 days, please go to the emergency room for further evaluation.    Thank you for visiting urgent care today.  We appreciate the opportunity to participate in your care.     Disposition Upon Discharge:  Condition: stable for discharge home  Patient presented with an acute illness with associated systemic symptoms and significant discomfort requiring urgent management. In my opinion, this is a condition that a prudent lay person (someone who possesses an average knowledge of health and medicine) may potentially expect to result in complications if not addressed urgently such as respiratory distress, impairment of bodily function or  dysfunction of bodily organs.   Routine symptom specific, illness specific and/or disease specific instructions were discussed with the patient and/or caregiver at length.   As such, the patient has been evaluated and assessed, work-up was performed and treatment was provided in alignment with urgent care protocols and evidence based medicine.  Patient/parent/caregiver has been advised that the patient may require follow up for further testing and treatment if the symptoms continue in spite of treatment, as clinically indicated and appropriate.  Patient/parent/caregiver has been advised to return to the [facility] or PCP if no better; to PCP or the Emergency Department if new signs and symptoms develop, or if the current signs or symptoms continue to change or worsen for further workup, evaluation and treatment as clinically indicated and appropriate  The patient will follow up with their current PCP if and as advised. If the patient does not currently have a PCP we will assist them in obtaining one.   The patient may need specialty follow up if the symptoms continue, in spite of conservative treatment and management, for further workup, evaluation, consultation and treatment as clinically indicated and appropriate.  Patient/parent/caregiver verbalized understanding and agreement of plan as  discussed.  All questions were addressed during visit.  Please see discharge instructions below for further details of plan.  This office note has been dictated using Teaching laboratory technician.  Unfortunately, this method of dictation can sometimes lead to typographical or grammatical errors.  I apologize for your inconvenience in advance if this occurs.  Please do not hesitate to reach out to me if clarification is needed.      Joesph Shaver Scales, PA-C 04/08/24 1321

## 2024-04-06 NOTE — Discharge Instructions (Addendum)
 Your COVID-19 test today was negative.  No further testing is needed.  Please read below to learn more about the medications, dosages and frequencies that I recommend to help alleviate your symptoms and to get you feeling better soon:   Z-Pak (azithromycin ):  Please take two (2) tablets on day one and one tablet daily thereafter until the prescription is complete.I have prescribed this antibiotic for you for you in an effort to prevent pneumonia from sitting in.  Please be advised that this antibiotic can cause upset stomach, this will resolve once antibiotics are complete.  You are welcome to take a probiotic, eat yogurt, take Imodium while taking this medication.  Please avoid other systemic medications such as Maalox, Pepto-Bismol or milk of magnesia as they can interfere with the body's ability to absorb the antibiotics.   Xyzal  (levocetirizine): This is an excellent second-generation antihistamine that helps to reduce respiratory inflammatory response to environmental allergens.  In some patients, this medication can cause daytime sleepiness so I recommend that you take 1 tablet daily at bedtime.  I have sent a prescription to your pharmacy because your insurance pays for this medication.  Your insurance will not pay for Zyrtec (cetirizine).  Atrovent  (ipratropium): This is an excellent nasal decongestant spray that does not cause rebound congestion.  Once your nasal congestion is improved, your postnasal drip will stop which will decrease your urge to cough.  Please instill 2 sprays into each nare with each use, you may use this up to 3 times daily.  Once you find that you are forgetting to use the spray more often that you remember to use it, you will know that you no longer need it.   Tessalon  Perles (benzonatate ): Benzonatate  as needed cough medication that works by numbing the throat and the lungs.  This dulls your lungs' cough reflex, making your cough less active.  I have sent a prescription to  your pharmacy.  ProAir , Ventolin , Proventil  (albuterol ): This inhaled medication contains a short acting beta agonist bronchodilator.  This medication relaxes the smooth muscle of the airway in the lungs.  When these muscles are tight, breathing becomes more constricted.  The result of relaxation of the smooth muscle is increased air movement and improved work of breathing.  This is a short acting medication that can be used every 4-6 hours as needed for increased work of breathing, shortness of breath, wheezing and excessive coughing.  It comes in the form of a handheld inhaler or nebulizer solution.  I recommended that for the next 3 to 4 days, this medication is used 4 times daily on a scheduled basis then decrease to twice daily and as needed until symptoms have completely resolved which I anticipate will be several weeks.  I have sent a prescription to your pharmacy.  If symptoms have not meaningfully improved in the next 7 to 10 days, please return for repeat evaluation or follow-up with your regular provider.  If symptoms have worsened in the next 3 to 5 days, please go to the emergency room for further evaluation.    Thank you for visiting urgent care today.  We appreciate the opportunity to participate in your care.

## 2024-04-06 NOTE — ED Triage Notes (Signed)
 Patient presents to the office for 10-day history of cough and congestion. Patient states she is taking Mucinex  and Nyquil.  Denies any othe symptoms.

## 2024-04-11 ENCOUNTER — Ambulatory Visit: Admitting: Orthopedic Surgery

## 2024-04-19 ENCOUNTER — Ambulatory Visit (INDEPENDENT_AMBULATORY_CARE_PROVIDER_SITE_OTHER): Payer: Medicare PPO

## 2024-04-19 DIAGNOSIS — I4430 Unspecified atrioventricular block: Secondary | ICD-10-CM

## 2024-04-19 LAB — CUP PACEART REMOTE DEVICE CHECK
Battery Remaining Longevity: 13 mo
Battery Remaining Percentage: 13 %
Battery Voltage: 2.86 V
Brady Statistic AP VP Percent: 6.6 %
Brady Statistic AP VS Percent: 1 %
Brady Statistic AS VP Percent: 89 %
Brady Statistic AS VS Percent: 2.1 %
Brady Statistic RA Percent Paced: 4.6 %
Brady Statistic RV Percent Paced: 96 %
Date Time Interrogation Session: 20250923020014
Implantable Lead Connection Status: 753985
Implantable Lead Connection Status: 753985
Implantable Lead Implant Date: 20180404
Implantable Lead Implant Date: 20180404
Implantable Lead Location: 753859
Implantable Lead Location: 753860
Implantable Lead Model: 3830
Implantable Pulse Generator Implant Date: 20180404
Lead Channel Impedance Value: 460 Ohm
Lead Channel Impedance Value: 560 Ohm
Lead Channel Pacing Threshold Amplitude: 0.5 V
Lead Channel Pacing Threshold Amplitude: 0.5 V
Lead Channel Pacing Threshold Pulse Width: 0.4 ms
Lead Channel Pacing Threshold Pulse Width: 1 ms
Lead Channel Sensing Intrinsic Amplitude: 12 mV
Lead Channel Sensing Intrinsic Amplitude: 4 mV
Lead Channel Setting Pacing Amplitude: 2 V
Lead Channel Setting Pacing Amplitude: 2 V
Lead Channel Setting Pacing Pulse Width: 1 ms
Lead Channel Setting Sensing Sensitivity: 2 mV
Pulse Gen Model: 2272
Pulse Gen Serial Number: 8002284

## 2024-04-20 NOTE — Progress Notes (Signed)
 Remote PPM Transmission

## 2024-04-21 DIAGNOSIS — Z6821 Body mass index (BMI) 21.0-21.9, adult: Secondary | ICD-10-CM | POA: Diagnosis not present

## 2024-04-21 DIAGNOSIS — M25521 Pain in right elbow: Secondary | ICD-10-CM | POA: Diagnosis not present

## 2024-04-21 DIAGNOSIS — M25512 Pain in left shoulder: Secondary | ICD-10-CM | POA: Diagnosis not present

## 2024-04-21 DIAGNOSIS — K219 Gastro-esophageal reflux disease without esophagitis: Secondary | ICD-10-CM | POA: Diagnosis not present

## 2024-04-21 DIAGNOSIS — M0579 Rheumatoid arthritis with rheumatoid factor of multiple sites without organ or systems involvement: Secondary | ICD-10-CM | POA: Diagnosis not present

## 2024-04-21 DIAGNOSIS — R110 Nausea: Secondary | ICD-10-CM | POA: Diagnosis not present

## 2024-04-21 DIAGNOSIS — M1991 Primary osteoarthritis, unspecified site: Secondary | ICD-10-CM | POA: Diagnosis not present

## 2024-04-21 DIAGNOSIS — M5136 Other intervertebral disc degeneration, lumbar region with discogenic back pain only: Secondary | ICD-10-CM | POA: Diagnosis not present

## 2024-04-24 ENCOUNTER — Ambulatory Visit: Payer: Self-pay | Admitting: Internal Medicine

## 2024-04-25 NOTE — Progress Notes (Signed)
 Remote pacemaker transmission.

## 2024-04-26 DIAGNOSIS — E782 Mixed hyperlipidemia: Secondary | ICD-10-CM | POA: Diagnosis not present

## 2024-04-26 DIAGNOSIS — I10 Essential (primary) hypertension: Secondary | ICD-10-CM | POA: Diagnosis not present

## 2024-04-26 DIAGNOSIS — M059 Rheumatoid arthritis with rheumatoid factor, unspecified: Secondary | ICD-10-CM | POA: Diagnosis not present

## 2024-04-27 DIAGNOSIS — H40013 Open angle with borderline findings, low risk, bilateral: Secondary | ICD-10-CM | POA: Diagnosis not present

## 2024-04-27 DIAGNOSIS — H04123 Dry eye syndrome of bilateral lacrimal glands: Secondary | ICD-10-CM | POA: Diagnosis not present

## 2024-04-27 DIAGNOSIS — H2512 Age-related nuclear cataract, left eye: Secondary | ICD-10-CM | POA: Diagnosis not present

## 2024-04-27 DIAGNOSIS — Z961 Presence of intraocular lens: Secondary | ICD-10-CM | POA: Diagnosis not present

## 2024-04-27 DIAGNOSIS — H17822 Peripheral opacity of cornea, left eye: Secondary | ICD-10-CM | POA: Diagnosis not present

## 2024-05-02 DIAGNOSIS — R9389 Abnormal findings on diagnostic imaging of other specified body structures: Secondary | ICD-10-CM | POA: Diagnosis not present

## 2024-05-02 DIAGNOSIS — K5909 Other constipation: Secondary | ICD-10-CM | POA: Diagnosis not present

## 2024-05-02 DIAGNOSIS — K219 Gastro-esophageal reflux disease without esophagitis: Secondary | ICD-10-CM | POA: Diagnosis not present

## 2024-05-10 DIAGNOSIS — I10 Essential (primary) hypertension: Secondary | ICD-10-CM | POA: Diagnosis not present

## 2024-05-27 DIAGNOSIS — M059 Rheumatoid arthritis with rheumatoid factor, unspecified: Secondary | ICD-10-CM | POA: Diagnosis not present

## 2024-05-27 DIAGNOSIS — E782 Mixed hyperlipidemia: Secondary | ICD-10-CM | POA: Diagnosis not present

## 2024-05-27 DIAGNOSIS — I10 Essential (primary) hypertension: Secondary | ICD-10-CM | POA: Diagnosis not present

## 2024-05-30 DIAGNOSIS — Z1231 Encounter for screening mammogram for malignant neoplasm of breast: Secondary | ICD-10-CM | POA: Diagnosis not present

## 2024-06-13 ENCOUNTER — Ambulatory Visit: Admitting: Orthopedic Surgery

## 2024-07-19 ENCOUNTER — Ambulatory Visit: Payer: Medicare PPO

## 2024-07-19 DIAGNOSIS — I4430 Unspecified atrioventricular block: Secondary | ICD-10-CM | POA: Diagnosis not present

## 2024-07-19 LAB — CUP PACEART REMOTE DEVICE CHECK
Battery Remaining Longevity: 11 mo
Battery Remaining Percentage: 10 %
Battery Voltage: 2.83 V
Brady Statistic AP VP Percent: 6.8 %
Brady Statistic AP VS Percent: 1 %
Brady Statistic AS VP Percent: 88 %
Brady Statistic AS VS Percent: 2.4 %
Brady Statistic RA Percent Paced: 4.7 %
Brady Statistic RV Percent Paced: 95 %
Date Time Interrogation Session: 20251223020013
Implantable Lead Connection Status: 753985
Implantable Lead Connection Status: 753985
Implantable Lead Implant Date: 20180404
Implantable Lead Implant Date: 20180404
Implantable Lead Location: 753859
Implantable Lead Location: 753860
Implantable Lead Model: 3830
Implantable Pulse Generator Implant Date: 20180404
Lead Channel Impedance Value: 530 Ohm
Lead Channel Impedance Value: 560 Ohm
Lead Channel Pacing Threshold Amplitude: 0.5 V
Lead Channel Pacing Threshold Amplitude: 0.5 V
Lead Channel Pacing Threshold Pulse Width: 0.4 ms
Lead Channel Pacing Threshold Pulse Width: 1 ms
Lead Channel Sensing Intrinsic Amplitude: 12 mV
Lead Channel Sensing Intrinsic Amplitude: 4.1 mV
Lead Channel Setting Pacing Amplitude: 2 V
Lead Channel Setting Pacing Amplitude: 2 V
Lead Channel Setting Pacing Pulse Width: 1 ms
Lead Channel Setting Sensing Sensitivity: 2 mV
Pulse Gen Model: 2272
Pulse Gen Serial Number: 8002284

## 2024-07-20 NOTE — Progress Notes (Signed)
 Remote PPM Transmission

## 2024-07-24 ENCOUNTER — Ambulatory Visit: Payer: Self-pay | Admitting: Internal Medicine

## 2024-07-27 ENCOUNTER — Other Ambulatory Visit: Payer: Self-pay

## 2024-07-27 ENCOUNTER — Ambulatory Visit: Admitting: Orthopedic Surgery

## 2024-07-27 DIAGNOSIS — M545 Low back pain, unspecified: Secondary | ICD-10-CM

## 2024-07-27 NOTE — Progress Notes (Signed)
 Orthopedic Spine Surgery Office Note   Assessment: Patient is a 78 y.o. female with chronic low back pain.  Worse with flexion.  Has a degenerative disc with vacuum disc phenomenon at L5/S1     Plan: -Patient has tried tylenol , gabapentin , robaxin , steroid injections - Her pain is well-controlled with Tylenol .  She is only taking 650 mg at night.  I told her that she could up this to 650 mg twice per day.  I told her for bad days she could also take ibuprofen  but she should not use this routinely as it can lead to stomach ulcers and kidney injury -Her pain seems to be more discogenic in nature so I do not think injections would help.  I talked about L4/5 and L5/S1 facet injections as a potential treatment to try if her pain gets worse -Would need to be nicotine free prior to any elective spine surgery -Patient should return to office on an as-needed basis   ___________________________________________________________________________     History:   Patient is a 78 y.o. female who presents today for follow up on her lumbar spine.  Patient comes in today for checkup on her back.  She said she is still having back pain.  She is not having any radiating leg pain.  She notes the pain in the lower lumbar spine.  She has not developed any new symptoms since she was last seen but her pain has improved and is no longer having the radiating left leg pain.  She feels that Tylenol  helps her pain.  She is only taking 650 mg at night.  She is using lidocaine  patches as well.   Treatments tried: tylenol , gabapentin , robaxin , steroid injections     Physical Exam:   General: no acute distress, appears stated age Neurologic: alert, answering questions appropriately, following commands Respiratory: unlabored breathing on room air, symmetric chest rise Psychiatric: appropriate affect, normal cadence to speech     MSK (spine):   -Strength exam                                                   Left                   Right EHL                              5/5                  5/5 TA                                 5/5                  5/5 GSC                             5/5                  5/5 Knee extension            5/5                  5/5 Hip flexion  5/5                  5/5   -Sensory exam                           Sensation intact to light touch in L3-S1 nerve distributions of bilateral lower extremities   Imaging: XRs of the lumbar spine from 07/27/2024 were independently reviewed and interpreted, showing disc height loss with small anterior aspect formation at L1/2 and L5/S1.  Vacuum disc phenomenon seen at L5/S1.  Gentle coronal curvature was seen on the AP view.  No fracture or dislocation seen.  No evidence of instability on the flexion/extension views.   CT myelogram of the lumbar spine from 09/25/2023 was previously independently reviewed and interpreted, showing facet arthropathy at L3/4, L4/5, and L5/S1. Foraminal stenosis on the left at L5/S1. No other significant stenosis seen.      Patient name: Denise Hodges Patient MRN: [id number] Date of visit: 07/27/2024
